# Patient Record
Sex: Male | Born: 1935 | Race: White | Hispanic: No | Marital: Married | Smoking: Former smoker
Health system: Southern US, Community
[De-identification: ages and names within clinical notes are randomized; demographics above are authoritative.]

## PROBLEM LIST (undated history)

## (undated) DIAGNOSIS — N401 Enlarged prostate with lower urinary tract symptoms: Secondary | ICD-10-CM

## (undated) DIAGNOSIS — R0600 Dyspnea, unspecified: Secondary | ICD-10-CM

## (undated) DIAGNOSIS — N138 Other obstructive and reflux uropathy: Secondary | ICD-10-CM

## (undated) DIAGNOSIS — G3183 Dementia with Lewy bodies: Secondary | ICD-10-CM

## (undated) DIAGNOSIS — F329 Major depressive disorder, single episode, unspecified: Secondary | ICD-10-CM

## (undated) DIAGNOSIS — G4733 Obstructive sleep apnea (adult) (pediatric): Secondary | ICD-10-CM

## (undated) DIAGNOSIS — A4902 Methicillin resistant Staphylococcus aureus infection, unspecified site: Secondary | ICD-10-CM

## (undated) DIAGNOSIS — H548 Legal blindness, as defined in USA: Secondary | ICD-10-CM

## (undated) DIAGNOSIS — C9310 Chronic myelomonocytic leukemia not having achieved remission: Secondary | ICD-10-CM

## (undated) DIAGNOSIS — D126 Benign neoplasm of colon, unspecified: Secondary | ICD-10-CM

## (undated) DIAGNOSIS — IMO0002 Reserved for concepts with insufficient information to code with codable children: Secondary | ICD-10-CM

## (undated) DIAGNOSIS — K219 Gastro-esophageal reflux disease without esophagitis: Secondary | ICD-10-CM

## (undated) DIAGNOSIS — I359 Nonrheumatic aortic valve disorder, unspecified: Secondary | ICD-10-CM

## (undated) DIAGNOSIS — A0472 Enterocolitis due to Clostridium difficile, not specified as recurrent: Secondary | ICD-10-CM

## (undated) DIAGNOSIS — F0280 Dementia in other diseases classified elsewhere without behavioral disturbance: Secondary | ICD-10-CM

## (undated) DIAGNOSIS — I4891 Unspecified atrial fibrillation: Secondary | ICD-10-CM

## (undated) DIAGNOSIS — F32A Depression, unspecified: Secondary | ICD-10-CM

## (undated) DIAGNOSIS — Z952 Presence of prosthetic heart valve: Secondary | ICD-10-CM

## (undated) DIAGNOSIS — C61 Malignant neoplasm of prostate: Secondary | ICD-10-CM

## (undated) DIAGNOSIS — I2510 Atherosclerotic heart disease of native coronary artery without angina pectoris: Secondary | ICD-10-CM

## (undated) DIAGNOSIS — D696 Thrombocytopenia, unspecified: Secondary | ICD-10-CM

## (undated) DIAGNOSIS — Z96649 Presence of unspecified artificial hip joint: Secondary | ICD-10-CM

## (undated) DIAGNOSIS — I10 Essential (primary) hypertension: Secondary | ICD-10-CM

## (undated) DIAGNOSIS — Z8614 Personal history of Methicillin resistant Staphylococcus aureus infection: Secondary | ICD-10-CM

## (undated) DIAGNOSIS — M1990 Unspecified osteoarthritis, unspecified site: Secondary | ICD-10-CM

## (undated) DIAGNOSIS — E785 Hyperlipidemia, unspecified: Secondary | ICD-10-CM

## (undated) HISTORY — DX: Reserved for concepts with insufficient information to code with codable children: IMO0002

## (undated) HISTORY — PX: CATARACT EXTRACTION, BILATERAL: SHX1313

## (undated) HISTORY — DX: Thrombocytopenia, unspecified: D69.6

## (undated) HISTORY — DX: Methicillin resistant Staphylococcus aureus infection, unspecified site: A49.02

## (undated) HISTORY — DX: Presence of unspecified artificial hip joint: Z96.649

## (undated) HISTORY — DX: Nonrheumatic aortic valve disorder, unspecified: I35.9

## (undated) HISTORY — DX: Essential (primary) hypertension: I10

## (undated) HISTORY — DX: Gastro-esophageal reflux disease without esophagitis: K21.9

## (undated) HISTORY — PX: ULTRASOUND GUIDANCE FOR VASCULAR ACCESS: SHX6516

## (undated) HISTORY — DX: Other obstructive and reflux uropathy: N13.8

## (undated) HISTORY — DX: Chronic myelomonocytic leukemia not having achieved remission: C93.10

## (undated) HISTORY — DX: Legal blindness, as defined in USA: H54.8

## (undated) HISTORY — DX: Obstructive sleep apnea (adult) (pediatric): G47.33

## (undated) HISTORY — DX: Depression, unspecified: F32.A

## (undated) HISTORY — DX: Unspecified osteoarthritis, unspecified site: M19.90

## (undated) HISTORY — DX: Enterocolitis due to Clostridium difficile, not specified as recurrent: A04.72

## (undated) HISTORY — DX: Hyperlipidemia, unspecified: E78.5

## (undated) HISTORY — DX: Malignant neoplasm of prostate: C61

## (undated) HISTORY — DX: Benign prostatic hyperplasia with lower urinary tract symptoms: N40.1

## (undated) HISTORY — DX: Major depressive disorder, single episode, unspecified: F32.9

## (undated) HISTORY — DX: Atherosclerotic heart disease of native coronary artery without angina pectoris: I25.10

## (undated) HISTORY — DX: Presence of prosthetic heart valve: Z95.2

## (undated) HISTORY — DX: Benign neoplasm of colon, unspecified: D12.6

## (undated) HISTORY — PX: CARDIAC CATHETERIZATION: SHX172

## (undated) HISTORY — DX: Dementia with Lewy bodies: G31.83

## (undated) HISTORY — DX: Unspecified atrial fibrillation: I48.91

## (undated) HISTORY — DX: Dyspnea, unspecified: R06.00

## (undated) HISTORY — DX: Dementia in other diseases classified elsewhere without behavioral disturbance: F02.80

---

## 1985-01-23 DIAGNOSIS — H548 Legal blindness, as defined in USA: Secondary | ICD-10-CM

## 1985-01-23 HISTORY — DX: Legal blindness, as defined in USA: H54.8

## 1991-01-24 HISTORY — PX: PROSTATECTOMY: SHX69

## 1993-03-23 DIAGNOSIS — D126 Benign neoplasm of colon, unspecified: Secondary | ICD-10-CM

## 1993-03-23 HISTORY — DX: Benign neoplasm of colon, unspecified: D12.6

## 1993-04-20 ENCOUNTER — Encounter: Payer: Self-pay | Admitting: Gastroenterology

## 1994-01-23 HISTORY — PX: ANAL SPHINCTEROTOMY: SHX1140

## 1997-11-10 ENCOUNTER — Emergency Department (HOSPITAL_COMMUNITY): Admission: EM | Admit: 1997-11-10 | Discharge: 1997-11-10 | Payer: Self-pay | Admitting: Emergency Medicine

## 1997-11-10 ENCOUNTER — Encounter: Payer: Self-pay | Admitting: Emergency Medicine

## 1997-11-11 ENCOUNTER — Ambulatory Visit: Admission: RE | Admit: 1997-11-11 | Discharge: 1997-11-11 | Payer: Self-pay | Admitting: Emergency Medicine

## 1997-12-25 ENCOUNTER — Ambulatory Visit (HOSPITAL_COMMUNITY): Admission: RE | Admit: 1997-12-25 | Discharge: 1997-12-25 | Payer: Self-pay | Admitting: *Deleted

## 1999-02-05 ENCOUNTER — Encounter: Payer: Self-pay | Admitting: Emergency Medicine

## 1999-02-05 ENCOUNTER — Emergency Department (HOSPITAL_COMMUNITY): Admission: EM | Admit: 1999-02-05 | Discharge: 1999-02-05 | Payer: Self-pay | Admitting: Emergency Medicine

## 1999-03-16 ENCOUNTER — Encounter (INDEPENDENT_AMBULATORY_CARE_PROVIDER_SITE_OTHER): Payer: Self-pay | Admitting: Specialist

## 1999-03-16 ENCOUNTER — Ambulatory Visit (HOSPITAL_BASED_OUTPATIENT_CLINIC_OR_DEPARTMENT_OTHER): Admission: RE | Admit: 1999-03-16 | Discharge: 1999-03-17 | Payer: Self-pay | Admitting: General Surgery

## 1999-06-30 ENCOUNTER — Encounter: Payer: Self-pay | Admitting: Urology

## 1999-06-30 ENCOUNTER — Ambulatory Visit (HOSPITAL_COMMUNITY): Admission: RE | Admit: 1999-06-30 | Discharge: 1999-06-30 | Payer: Self-pay | Admitting: Urology

## 2001-07-22 ENCOUNTER — Emergency Department (HOSPITAL_COMMUNITY): Admission: EM | Admit: 2001-07-22 | Discharge: 2001-07-23 | Payer: Self-pay

## 2001-11-03 ENCOUNTER — Encounter: Payer: Self-pay | Admitting: Emergency Medicine

## 2001-11-03 ENCOUNTER — Emergency Department (HOSPITAL_COMMUNITY): Admission: EM | Admit: 2001-11-03 | Discharge: 2001-11-03 | Payer: Self-pay | Admitting: Emergency Medicine

## 2003-03-04 ENCOUNTER — Encounter: Payer: Self-pay | Admitting: Gastroenterology

## 2003-09-10 ENCOUNTER — Ambulatory Visit (HOSPITAL_COMMUNITY): Admission: RE | Admit: 2003-09-10 | Discharge: 2003-09-10 | Payer: Self-pay | Admitting: Urology

## 2003-09-10 ENCOUNTER — Ambulatory Visit (HOSPITAL_BASED_OUTPATIENT_CLINIC_OR_DEPARTMENT_OTHER): Admission: RE | Admit: 2003-09-10 | Discharge: 2003-09-10 | Payer: Self-pay | Admitting: Urology

## 2003-09-26 ENCOUNTER — Emergency Department (HOSPITAL_COMMUNITY): Admission: EM | Admit: 2003-09-26 | Discharge: 2003-09-26 | Payer: Self-pay | Admitting: Emergency Medicine

## 2004-01-24 DIAGNOSIS — Z96649 Presence of unspecified artificial hip joint: Secondary | ICD-10-CM

## 2004-01-24 HISTORY — DX: Presence of unspecified artificial hip joint: Z96.649

## 2004-01-24 HISTORY — PX: TOTAL HIP ARTHROPLASTY: SHX124

## 2004-07-19 ENCOUNTER — Inpatient Hospital Stay (HOSPITAL_COMMUNITY): Admission: RE | Admit: 2004-07-19 | Discharge: 2004-07-26 | Payer: Self-pay | Admitting: Orthopedic Surgery

## 2004-07-19 ENCOUNTER — Ambulatory Visit: Payer: Self-pay | Admitting: Physical Medicine & Rehabilitation

## 2004-11-04 ENCOUNTER — Emergency Department (HOSPITAL_COMMUNITY): Admission: EM | Admit: 2004-11-04 | Discharge: 2004-11-04 | Payer: Self-pay | Admitting: Emergency Medicine

## 2005-04-14 ENCOUNTER — Emergency Department: Payer: Self-pay | Admitting: Emergency Medicine

## 2005-04-14 ENCOUNTER — Emergency Department (HOSPITAL_COMMUNITY): Admission: EM | Admit: 2005-04-14 | Discharge: 2005-04-15 | Payer: Self-pay | Admitting: Emergency Medicine

## 2006-02-23 ENCOUNTER — Ambulatory Visit: Payer: Self-pay | Admitting: Hematology & Oncology

## 2006-03-12 LAB — CBC WITH DIFFERENTIAL/PLATELET
EOS%: 0.4 % (ref 0.0–7.0)
Eosinophils Absolute: 0 10*3/uL (ref 0.0–0.5)
LYMPH%: 22.9 % (ref 14.0–48.0)
MCH: 33.3 pg (ref 28.0–33.4)
MCV: 92.7 fL (ref 81.6–98.0)
MONO%: 14.1 % — ABNORMAL HIGH (ref 0.0–13.0)
NEUT#: 3.8 10*3/uL (ref 1.5–6.5)
Platelets: 140 10*3/uL — ABNORMAL LOW (ref 145–400)
RBC: 4.21 10*6/uL (ref 4.20–5.71)

## 2006-03-12 LAB — CHCC SMEAR

## 2008-03-05 ENCOUNTER — Ambulatory Visit: Payer: Self-pay | Admitting: Gastroenterology

## 2008-03-24 ENCOUNTER — Ambulatory Visit: Payer: Self-pay | Admitting: Gastroenterology

## 2008-04-29 ENCOUNTER — Ambulatory Visit: Payer: Self-pay | Admitting: Gastroenterology

## 2008-04-29 DIAGNOSIS — K219 Gastro-esophageal reflux disease without esophagitis: Secondary | ICD-10-CM

## 2008-04-29 DIAGNOSIS — Z860100 Personal history of colon polyps, unspecified: Secondary | ICD-10-CM | POA: Insufficient documentation

## 2008-04-29 DIAGNOSIS — G4733 Obstructive sleep apnea (adult) (pediatric): Secondary | ICD-10-CM | POA: Insufficient documentation

## 2008-05-07 ENCOUNTER — Ambulatory Visit: Payer: Self-pay | Admitting: Gastroenterology

## 2009-01-23 HISTORY — PX: CHOLECYSTECTOMY: SHX55

## 2009-01-23 LAB — HM DIABETES EYE EXAM: HM Diabetic Eye Exam: NORMAL

## 2009-03-15 HISTORY — PX: HERNIA REPAIR: SHX51

## 2009-06-29 ENCOUNTER — Encounter: Admission: RE | Admit: 2009-06-29 | Discharge: 2009-06-29 | Payer: Self-pay | Admitting: Endocrinology

## 2009-08-18 ENCOUNTER — Observation Stay (HOSPITAL_COMMUNITY): Admission: RE | Admit: 2009-08-18 | Discharge: 2009-08-19 | Payer: Self-pay | Admitting: Surgery

## 2009-08-18 ENCOUNTER — Encounter (INDEPENDENT_AMBULATORY_CARE_PROVIDER_SITE_OTHER): Payer: Self-pay | Admitting: Surgery

## 2009-10-04 ENCOUNTER — Encounter: Admission: RE | Admit: 2009-10-04 | Discharge: 2009-10-04 | Payer: Self-pay | Admitting: Orthopedic Surgery

## 2009-11-08 ENCOUNTER — Inpatient Hospital Stay (HOSPITAL_COMMUNITY): Admission: EM | Admit: 2009-11-08 | Discharge: 2009-11-12 | Payer: Self-pay | Admitting: Emergency Medicine

## 2009-11-11 ENCOUNTER — Ambulatory Visit: Payer: Self-pay | Admitting: Infectious Disease

## 2009-11-18 ENCOUNTER — Encounter: Payer: Self-pay | Admitting: Internal Medicine

## 2009-11-22 ENCOUNTER — Encounter: Payer: Self-pay | Admitting: Infectious Disease

## 2009-11-24 ENCOUNTER — Encounter: Payer: Self-pay | Admitting: Internal Medicine

## 2009-11-30 ENCOUNTER — Ambulatory Visit: Payer: Self-pay | Admitting: Internal Medicine

## 2009-11-30 ENCOUNTER — Ambulatory Visit: Payer: Self-pay | Admitting: Oncology

## 2009-11-30 DIAGNOSIS — B951 Streptococcus, group B, as the cause of diseases classified elsewhere: Secondary | ICD-10-CM

## 2009-11-30 DIAGNOSIS — Z96649 Presence of unspecified artificial hip joint: Secondary | ICD-10-CM

## 2009-11-30 DIAGNOSIS — IMO0002 Reserved for concepts with insufficient information to code with codable children: Secondary | ICD-10-CM | POA: Insufficient documentation

## 2009-11-30 DIAGNOSIS — M1990 Unspecified osteoarthritis, unspecified site: Secondary | ICD-10-CM | POA: Insufficient documentation

## 2009-11-30 DIAGNOSIS — D693 Immune thrombocytopenic purpura: Secondary | ICD-10-CM | POA: Insufficient documentation

## 2009-11-30 DIAGNOSIS — I359 Nonrheumatic aortic valve disorder, unspecified: Secondary | ICD-10-CM | POA: Insufficient documentation

## 2009-12-01 ENCOUNTER — Telehealth: Payer: Self-pay | Admitting: Infectious Disease

## 2009-12-01 ENCOUNTER — Encounter: Payer: Self-pay | Admitting: Infectious Disease

## 2009-12-06 LAB — COMPREHENSIVE METABOLIC PANEL
Albumin: 4.3 g/dL (ref 3.5–5.2)
BUN: 19 mg/dL (ref 6–23)
CO2: 30 mEq/L (ref 19–32)
Calcium: 9.7 mg/dL (ref 8.4–10.5)
Chloride: 106 mEq/L (ref 96–112)
Creatinine, Ser: 1.11 mg/dL (ref 0.40–1.50)
Potassium: 4.4 mEq/L (ref 3.5–5.3)

## 2009-12-06 LAB — CBC WITH DIFFERENTIAL/PLATELET
BASO%: 0.2 % (ref 0.0–2.0)
EOS%: 0.9 % (ref 0.0–7.0)
MCH: 32.5 pg (ref 27.2–33.4)
MCHC: 35.1 g/dL (ref 32.0–36.0)
MCV: 92.7 fL (ref 79.3–98.0)
MONO%: 34.1 % — ABNORMAL HIGH (ref 0.0–14.0)
NEUT#: 4.1 10*3/uL (ref 1.5–6.5)
RBC: 4.17 10*6/uL — ABNORMAL LOW (ref 4.20–5.82)
RDW: 13.9 % (ref 11.0–14.6)

## 2009-12-06 LAB — LACTATE DEHYDROGENASE: LDH: 130 U/L (ref 94–250)

## 2009-12-06 LAB — MORPHOLOGY

## 2009-12-08 LAB — PSA: PSA: 0.19 ng/mL (ref <=4.00)

## 2009-12-08 LAB — PROTEIN ELECTROPHORESIS, SERUM
Beta Globulin: 5 % (ref 4.7–7.2)
Gamma Globulin: 8.2 % — ABNORMAL LOW (ref 11.1–18.8)
Total Protein, Serum Electrophoresis: 6.6 g/dL (ref 6.0–8.3)

## 2009-12-08 LAB — KAPPA/LAMBDA LIGHT CHAINS
Kappa free light chain: 0.85 mg/dL (ref 0.33–1.94)
Lambda Free Lght Chn: 1.33 mg/dL (ref 0.57–2.63)

## 2009-12-08 LAB — VITAMIN B12: Vitamin B-12: 1241 pg/mL — ABNORMAL HIGH (ref 211–911)

## 2009-12-09 ENCOUNTER — Telehealth: Payer: Self-pay | Admitting: Internal Medicine

## 2009-12-09 ENCOUNTER — Inpatient Hospital Stay (HOSPITAL_COMMUNITY): Admission: EM | Admit: 2009-12-09 | Discharge: 2009-12-13 | Payer: Self-pay | Admitting: Emergency Medicine

## 2009-12-21 ENCOUNTER — Encounter: Payer: Self-pay | Admitting: Internal Medicine

## 2009-12-21 ENCOUNTER — Other Ambulatory Visit
Admission: RE | Admit: 2009-12-21 | Discharge: 2009-12-21 | Payer: Self-pay | Source: Home / Self Care | Admitting: Oncology

## 2009-12-21 ENCOUNTER — Encounter: Payer: Self-pay | Admitting: Gastroenterology

## 2009-12-30 ENCOUNTER — Ambulatory Visit: Payer: Self-pay | Admitting: Internal Medicine

## 2009-12-30 DIAGNOSIS — A0472 Enterocolitis due to Clostridium difficile, not specified as recurrent: Secondary | ICD-10-CM

## 2009-12-30 HISTORY — DX: Enterocolitis due to Clostridium difficile, not specified as recurrent: A04.72

## 2010-01-04 ENCOUNTER — Ambulatory Visit (HOSPITAL_COMMUNITY)
Admission: RE | Admit: 2010-01-04 | Discharge: 2010-01-05 | Payer: Self-pay | Source: Home / Self Care | Attending: Cardiovascular Disease | Admitting: Cardiovascular Disease

## 2010-01-05 ENCOUNTER — Ambulatory Visit: Payer: Self-pay | Admitting: Oncology

## 2010-01-06 ENCOUNTER — Ambulatory Visit: Payer: Self-pay | Admitting: Cardiothoracic Surgery

## 2010-01-10 ENCOUNTER — Telehealth: Payer: Self-pay | Admitting: Internal Medicine

## 2010-01-11 ENCOUNTER — Emergency Department (HOSPITAL_COMMUNITY)
Admission: EM | Admit: 2010-01-11 | Discharge: 2010-01-11 | Payer: Self-pay | Source: Home / Self Care | Admitting: Emergency Medicine

## 2010-01-12 ENCOUNTER — Telehealth: Payer: Self-pay | Admitting: Internal Medicine

## 2010-01-15 ENCOUNTER — Encounter: Payer: Self-pay | Admitting: Infectious Disease

## 2010-01-18 ENCOUNTER — Encounter: Payer: Self-pay | Admitting: Internal Medicine

## 2010-01-26 ENCOUNTER — Other Ambulatory Visit: Payer: Self-pay | Admitting: Cardiothoracic Surgery

## 2010-01-26 ENCOUNTER — Ambulatory Visit (HOSPITAL_COMMUNITY)
Admission: RE | Admit: 2010-01-26 | Discharge: 2010-01-26 | Payer: Self-pay | Source: Home / Self Care | Attending: Cardiothoracic Surgery | Admitting: Cardiothoracic Surgery

## 2010-01-26 LAB — URINALYSIS, ROUTINE W REFLEX MICROSCOPIC
Bilirubin Urine: NEGATIVE
Ketones, ur: NEGATIVE mg/dL
Leukocytes, UA: NEGATIVE
Nitrite: NEGATIVE
Protein, ur: NEGATIVE mg/dL
Specific Gravity, Urine: 1.008 (ref 1.005–1.030)
Urine Glucose, Fasting: NEGATIVE mg/dL
Urobilinogen, UA: 0.2 mg/dL (ref 0.0–1.0)
pH: 5.5 (ref 5.0–8.0)

## 2010-01-26 LAB — CBC
HCT: 41.2 % (ref 39.0–52.0)
Hemoglobin: 14.3 g/dL (ref 13.0–17.0)
MCH: 31.1 pg (ref 26.0–34.0)
MCHC: 34.7 g/dL (ref 30.0–36.0)
MCV: 89.6 fL (ref 78.0–100.0)
Platelets: 138 10*3/uL — ABNORMAL LOW (ref 150–400)
RBC: 4.6 MIL/uL (ref 4.22–5.81)
RDW: 13.9 % (ref 11.5–15.5)
WBC: 12.3 10*3/uL — ABNORMAL HIGH (ref 4.0–10.5)

## 2010-01-26 LAB — BLOOD GAS, ARTERIAL
Acid-Base Excess: 1.8 mmol/L (ref 0.0–2.0)
Bicarbonate: 25.7 mEq/L — ABNORMAL HIGH (ref 20.0–24.0)
Drawn by: 206361
FIO2: 0.21 %
O2 Saturation: 97.6 %
Patient temperature: 98.6
TCO2: 26.9 mmol/L (ref 0–100)
pCO2 arterial: 39.3 mmHg (ref 35.0–45.0)
pH, Arterial: 7.431 (ref 7.350–7.450)
pO2, Arterial: 90.4 mmHg (ref 80.0–100.0)

## 2010-01-26 LAB — COMPREHENSIVE METABOLIC PANEL
ALT: 34 U/L (ref 0–53)
AST: 28 U/L (ref 0–37)
Albumin: 4.2 g/dL (ref 3.5–5.2)
Alkaline Phosphatase: 75 U/L (ref 39–117)
BUN: 9 mg/dL (ref 6–23)
CO2: 23 mEq/L (ref 19–32)
Calcium: 9.8 mg/dL (ref 8.4–10.5)
Chloride: 104 mEq/L (ref 96–112)
Creatinine, Ser: 0.86 mg/dL (ref 0.4–1.5)
GFR calc Af Amer: >60 mL/min (ref >60)
GFR calc non Af Amer: >60 mL/min (ref >60)
Glucose, Bld: 86 mg/dL (ref 70–99)
Potassium: 4.2 mEq/L (ref 3.5–5.1)
Sodium: 140 mEq/L (ref 135–145)
Total Bilirubin: 0.7 mg/dL (ref 0.3–1.2)
Total Protein: 6.6 g/dL (ref 6.0–8.3)

## 2010-01-26 LAB — PROTIME-INR
INR: 0.99 (ref 0.00–1.49)
Prothrombin Time: 13.3 seconds (ref 11.6–15.2)

## 2010-01-26 LAB — HEMOGLOBIN A1C
Hgb A1c MFr Bld: 5.6 % (ref <5.7)
Mean Plasma Glucose: 114 mg/dL (ref <117)

## 2010-01-26 LAB — ABO/RH: ABO/RH(D): A POS

## 2010-01-26 LAB — APTT: aPTT: 42 seconds — ABNORMAL HIGH (ref 24–37)

## 2010-01-26 LAB — URINE MICROSCOPIC-ADD ON

## 2010-01-28 ENCOUNTER — Encounter: Payer: Self-pay | Admitting: Cardiothoracic Surgery

## 2010-01-28 ENCOUNTER — Inpatient Hospital Stay (HOSPITAL_COMMUNITY)
Admission: RE | Admit: 2010-01-28 | Discharge: 2010-02-11 | Disposition: A | Discharge status: Rehabilitation Facility | Payer: Self-pay | Source: Home / Self Care | Attending: Cardiothoracic Surgery | Admitting: Cardiothoracic Surgery

## 2010-01-28 HISTORY — PX: AORTIC VALVE REPLACEMENT: SHX41

## 2010-01-28 HISTORY — PX: CORONARY ARTERY BYPASS GRAFT: SHX141

## 2010-01-28 LAB — POCT I-STAT 4, (NA,K, GLUC, HGB,HCT)
Glucose, Bld: 121 mg/dL — ABNORMAL HIGH (ref 70–99)
Glucose, Bld: 108 mg/dL — ABNORMAL HIGH (ref 70–99)
Glucose, Bld: 127 mg/dL — ABNORMAL HIGH (ref 70–99)
Glucose, Bld: 166 mg/dL — ABNORMAL HIGH (ref 70–99)
Glucose, Bld: 99 mg/dL (ref 70–99)
HCT: 32 % — ABNORMAL LOW (ref 39.0–52.0)
HCT: 25 % — ABNORMAL LOW (ref 39.0–52.0)
HCT: 25 % — ABNORMAL LOW (ref 39.0–52.0)
HCT: 24 % — ABNORMAL LOW (ref 39.0–52.0)
HCT: 30 % — ABNORMAL LOW (ref 39.0–52.0)
Hemoglobin: 10.9 g/dL — ABNORMAL LOW (ref 13.0–17.0)
Hemoglobin: 8.5 g/dL — ABNORMAL LOW (ref 13.0–17.0)
Hemoglobin: 8.5 g/dL — ABNORMAL LOW (ref 13.0–17.0)
Hemoglobin: 8.2 g/dL — ABNORMAL LOW (ref 13.0–17.0)
Hemoglobin: 10.2 g/dL — ABNORMAL LOW (ref 13.0–17.0)
Potassium: 4 mEq/L (ref 3.5–5.1)
Potassium: 3.9 mEq/L (ref 3.5–5.1)
Potassium: 4.5 mEq/L (ref 3.5–5.1)
Potassium: 4 mEq/L (ref 3.5–5.1)
Potassium: 3.5 mEq/L (ref 3.5–5.1)
Sodium: 140 mEq/L (ref 135–145)
Sodium: 135 mEq/L (ref 135–145)
Sodium: 137 mEq/L (ref 135–145)
Sodium: 138 mEq/L (ref 135–145)
Sodium: 141 mEq/L (ref 135–145)

## 2010-01-28 LAB — CBC
HCT: 29.5 % — ABNORMAL LOW (ref 39.0–52.0)
Hemoglobin: 10.1 g/dL — ABNORMAL LOW (ref 13.0–17.0)
MCH: 30.5 pg (ref 26.0–34.0)
MCHC: 34.2 g/dL (ref 30.0–36.0)
MCV: 89.1 fL (ref 78.0–100.0)
Platelets: 103 10*3/uL — ABNORMAL LOW (ref 150–400)
RBC: 3.31 MIL/uL — ABNORMAL LOW (ref 4.22–5.81)
RDW: 13.9 % (ref 11.5–15.5)
WBC: 23.3 10*3/uL — ABNORMAL HIGH (ref 4.0–10.5)

## 2010-01-28 LAB — POCT I-STAT 3, ART BLOOD GAS (G3+)
Acid-base deficit: 2 mmol/L (ref 0.0–2.0)
Acid-base deficit: 3 mmol/L — ABNORMAL HIGH (ref 0.0–2.0)
Bicarbonate: 26.2 mEq/L — ABNORMAL HIGH (ref 20.0–24.0)
Bicarbonate: 23.8 mEq/L (ref 20.0–24.0)
Bicarbonate: 21.4 mEq/L (ref 20.0–24.0)
O2 Saturation: 100 %
O2 Saturation: 99 %
O2 Saturation: 93 %
Patient temperature: 36
TCO2: 28 mmol/L (ref 0–100)
TCO2: 25 mmol/L (ref 0–100)
TCO2: 22 mmol/L (ref 0–100)
pCO2 arterial: 47.4 mmHg — ABNORMAL HIGH (ref 35.0–45.0)
pCO2 arterial: 45.8 mmHg — ABNORMAL HIGH (ref 35.0–45.0)
pCO2 arterial: 33.8 mmHg — ABNORMAL LOW (ref 35.0–45.0)
pH, Arterial: 7.35 (ref 7.350–7.450)
pH, Arterial: 7.325 — ABNORMAL LOW (ref 7.350–7.450)
pH, Arterial: 7.405 (ref 7.350–7.450)
pO2, Arterial: 352 mmHg — ABNORMAL HIGH (ref 80.0–100.0)
pO2, Arterial: 134 mmHg — ABNORMAL HIGH (ref 80.0–100.0)
pO2, Arterial: 62 mmHg — ABNORMAL LOW (ref 80.0–100.0)

## 2010-01-28 LAB — POCT I-STAT 3, VENOUS BLOOD GAS (G3P V)
Acid-base deficit: 2 mmol/L (ref 0.0–2.0)
Bicarbonate: 24.6 mEq/L — ABNORMAL HIGH (ref 20.0–24.0)
O2 Saturation: 82 %
TCO2: 26 mmol/L (ref 0–100)
pCO2, Ven: 48.5 mmHg (ref 45.0–50.0)
pH, Ven: 7.313 — ABNORMAL HIGH (ref 7.250–7.300)
pO2, Ven: 51 mmHg — ABNORMAL HIGH (ref 30.0–45.0)

## 2010-01-28 LAB — GLUCOSE, POCT (MANUAL RESULT ENTRY)
Glucose, Bld: 113 mg/dL — ABNORMAL HIGH (ref 70–99)
Glucose, Bld: 136 mg/dL — ABNORMAL HIGH (ref 70–99)
Operator id: 139621
Operator id: 3342

## 2010-01-28 LAB — GLUCOSE, CAPILLARY: Glucose-Capillary: 115 mg/dL — ABNORMAL HIGH (ref 70–99)

## 2010-01-28 LAB — APTT: aPTT: 38 seconds — ABNORMAL HIGH (ref 24–37)

## 2010-01-28 LAB — HEMOGLOBIN AND HEMATOCRIT, BLOOD
HCT: 23.9 % — ABNORMAL LOW (ref 39.0–52.0)
Hemoglobin: 8.3 g/dL — ABNORMAL LOW (ref 13.0–17.0)

## 2010-01-28 LAB — PROTIME-INR
INR: 1.52 — ABNORMAL HIGH (ref 0.00–1.49)
Prothrombin Time: 18.5 seconds — ABNORMAL HIGH (ref 11.6–15.2)

## 2010-01-28 LAB — PLATELET COUNT: Platelets: 90 10*3/uL — ABNORMAL LOW (ref 150–400)

## 2010-02-07 LAB — CBC
HCT: 30.5 % — ABNORMAL LOW (ref 39.0–52.0)
HCT: 27.2 % — ABNORMAL LOW (ref 39.0–52.0)
HCT: 26.6 % — ABNORMAL LOW (ref 39.0–52.0)
HCT: 24.8 % — ABNORMAL LOW (ref 39.0–52.0)
HCT: 23 % — ABNORMAL LOW (ref 39.0–52.0)
HCT: 22.5 % — ABNORMAL LOW (ref 39.0–52.0)
HCT: 24.5 % — ABNORMAL LOW (ref 39.0–52.0)
HCT: 25.9 % — ABNORMAL LOW (ref 39.0–52.0)
HCT: 25.2 % — ABNORMAL LOW (ref 39.0–52.0)
HCT: 26.5 % — ABNORMAL LOW (ref 39.0–52.0)
HCT: 27.1 % — ABNORMAL LOW (ref 39.0–52.0)
HCT: 25.9 % — ABNORMAL LOW (ref 39.0–52.0)
Hemoglobin: 10.5 g/dL — ABNORMAL LOW (ref 13.0–17.0)
Hemoglobin: 9.3 g/dL — ABNORMAL LOW (ref 13.0–17.0)
Hemoglobin: 8.8 g/dL — ABNORMAL LOW (ref 13.0–17.0)
Hemoglobin: 8.3 g/dL — ABNORMAL LOW (ref 13.0–17.0)
Hemoglobin: 7.8 g/dL — ABNORMAL LOW (ref 13.0–17.0)
Hemoglobin: 7.8 g/dL — ABNORMAL LOW (ref 13.0–17.0)
Hemoglobin: 8.6 g/dL — ABNORMAL LOW (ref 13.0–17.0)
Hemoglobin: 8.9 g/dL — ABNORMAL LOW (ref 13.0–17.0)
Hemoglobin: 8.8 g/dL — ABNORMAL LOW (ref 13.0–17.0)
Hemoglobin: 9.4 g/dL — ABNORMAL LOW (ref 13.0–17.0)
Hemoglobin: 9.5 g/dL — ABNORMAL LOW (ref 13.0–17.0)
Hemoglobin: 8.9 g/dL — ABNORMAL LOW (ref 13.0–17.0)
MCH: 31.1 pg (ref 26.0–34.0)
MCH: 31.3 pg (ref 26.0–34.0)
MCH: 30.9 pg (ref 26.0–34.0)
MCH: 30.9 pg (ref 26.0–34.0)
MCH: 30.8 pg (ref 26.0–34.0)
MCH: 31.5 pg (ref 26.0–34.0)
MCH: 31.2 pg (ref 26.0–34.0)
MCH: 30.5 pg (ref 26.0–34.0)
MCH: 30.9 pg (ref 26.0–34.0)
MCH: 30.9 pg (ref 26.0–34.0)
MCH: 30.6 pg (ref 26.0–34.0)
MCH: 30.6 pg (ref 26.0–34.0)
MCHC: 34.4 g/dL (ref 30.0–36.0)
MCHC: 34.2 g/dL (ref 30.0–36.0)
MCHC: 33.1 g/dL (ref 30.0–36.0)
MCHC: 33.5 g/dL (ref 30.0–36.0)
MCHC: 33.9 g/dL (ref 30.0–36.0)
MCHC: 34.7 g/dL (ref 30.0–36.0)
MCHC: 35.1 g/dL (ref 30.0–36.0)
MCHC: 34.4 g/dL (ref 30.0–36.0)
MCHC: 34.9 g/dL (ref 30.0–36.0)
MCHC: 35.5 g/dL (ref 30.0–36.0)
MCHC: 35.1 g/dL (ref 30.0–36.0)
MCHC: 34.4 g/dL (ref 30.0–36.0)
MCV: 90.2 fL (ref 78.0–100.0)
MCV: 91.6 fL (ref 78.0–100.0)
MCV: 93.3 fL (ref 78.0–100.0)
MCV: 92.2 fL (ref 78.0–100.0)
MCV: 90.9 fL (ref 78.0–100.0)
MCV: 90.7 fL (ref 78.0–100.0)
MCV: 88.8 fL (ref 78.0–100.0)
MCV: 88.7 fL (ref 78.0–100.0)
MCV: 88.4 fL (ref 78.0–100.0)
MCV: 87.2 fL (ref 78.0–100.0)
MCV: 87.4 fL (ref 78.0–100.0)
MCV: 89 fL (ref 78.0–100.0)
Platelets: 137 10*3/uL — ABNORMAL LOW (ref 150–400)
Platelets: 89 10*3/uL — ABNORMAL LOW (ref 150–400)
Platelets: 89 10*3/uL — ABNORMAL LOW (ref 150–400)
Platelets: 63 10*3/uL — ABNORMAL LOW (ref 150–400)
Platelets: 61 10*3/uL — ABNORMAL LOW (ref 150–400)
Platelets: 57 10*3/uL — ABNORMAL LOW (ref 150–400)
Platelets: 67 10*3/uL — ABNORMAL LOW (ref 150–400)
Platelets: 79 10*3/uL — ABNORMAL LOW (ref 150–400)
Platelets: 82 10*3/uL — ABNORMAL LOW (ref 150–400)
Platelets: 109 10*3/uL — ABNORMAL LOW (ref 150–400)
Platelets: 142 10*3/uL — ABNORMAL LOW (ref 150–400)
Platelets: 164 10*3/uL (ref 150–400)
RBC: 3.38 MIL/uL — ABNORMAL LOW (ref 4.22–5.81)
RBC: 2.97 MIL/uL — ABNORMAL LOW (ref 4.22–5.81)
RBC: 2.85 MIL/uL — ABNORMAL LOW (ref 4.22–5.81)
RBC: 2.69 MIL/uL — ABNORMAL LOW (ref 4.22–5.81)
RBC: 2.53 MIL/uL — ABNORMAL LOW (ref 4.22–5.81)
RBC: 2.48 MIL/uL — ABNORMAL LOW (ref 4.22–5.81)
RBC: 2.76 MIL/uL — ABNORMAL LOW (ref 4.22–5.81)
RBC: 2.92 MIL/uL — ABNORMAL LOW (ref 4.22–5.81)
RBC: 2.85 MIL/uL — ABNORMAL LOW (ref 4.22–5.81)
RBC: 3.04 MIL/uL — ABNORMAL LOW (ref 4.22–5.81)
RBC: 3.1 MIL/uL — ABNORMAL LOW (ref 4.22–5.81)
RBC: 2.91 MIL/uL — ABNORMAL LOW (ref 4.22–5.81)
RDW: 14 % (ref 11.5–15.5)
RDW: 14.4 % (ref 11.5–15.5)
RDW: 14.9 % (ref 11.5–15.5)
RDW: 14.9 % (ref 11.5–15.5)
RDW: 14.9 % (ref 11.5–15.5)
RDW: 14.7 % (ref 11.5–15.5)
RDW: 14.9 % (ref 11.5–15.5)
RDW: 14.9 % (ref 11.5–15.5)
RDW: 14.9 % (ref 11.5–15.5)
RDW: 14.5 % (ref 11.5–15.5)
RDW: 14.8 % (ref 11.5–15.5)
RDW: 15.1 % (ref 11.5–15.5)
WBC: 49.5 10*3/uL — ABNORMAL HIGH (ref 4.0–10.5)
WBC: 56.1 10*3/uL (ref 4.0–10.5)
WBC: 66.2 10*3/uL (ref 4.0–10.5)
WBC: 52.1 10*3/uL (ref 4.0–10.5)
WBC: 47.9 10*3/uL — ABNORMAL HIGH (ref 4.0–10.5)
WBC: 39.6 10*3/uL — ABNORMAL HIGH (ref 4.0–10.5)
WBC: 23 10*3/uL — ABNORMAL HIGH (ref 4.0–10.5)
WBC: 21.8 10*3/uL — ABNORMAL HIGH (ref 4.0–10.5)
WBC: 18.1 10*3/uL — ABNORMAL HIGH (ref 4.0–10.5)
WBC: 19.1 10*3/uL — ABNORMAL HIGH (ref 4.0–10.5)
WBC: 22 10*3/uL — ABNORMAL HIGH (ref 4.0–10.5)
WBC: 13.7 10*3/uL — ABNORMAL HIGH (ref 4.0–10.5)

## 2010-02-07 LAB — COMPREHENSIVE METABOLIC PANEL
ALT: 252 U/L — ABNORMAL HIGH (ref 0–53)
ALT: 229 U/L — ABNORMAL HIGH (ref 0–53)
ALT: 199 U/L — ABNORMAL HIGH (ref 0–53)
AST: 320 U/L — ABNORMAL HIGH (ref 0–37)
AST: 153 U/L — ABNORMAL HIGH (ref 0–37)
AST: 102 U/L — ABNORMAL HIGH (ref 0–37)
Albumin: 3.4 g/dL — ABNORMAL LOW (ref 3.5–5.2)
Albumin: 2.8 g/dL — ABNORMAL LOW (ref 3.5–5.2)
Albumin: 2.9 g/dL — ABNORMAL LOW (ref 3.5–5.2)
Alkaline Phosphatase: 95 U/L (ref 39–117)
Alkaline Phosphatase: 111 U/L (ref 39–117)
Alkaline Phosphatase: 119 U/L — ABNORMAL HIGH (ref 39–117)
BUN: 54 mg/dL — ABNORMAL HIGH (ref 6–23)
BUN: 54 mg/dL — ABNORMAL HIGH (ref 6–23)
BUN: 39 mg/dL — ABNORMAL HIGH (ref 6–23)
CO2: 24 mEq/L (ref 19–32)
CO2: 26 mEq/L (ref 19–32)
CO2: 27 mEq/L (ref 19–32)
Calcium: 8.7 mg/dL (ref 8.4–10.5)
Calcium: 8.1 mg/dL — ABNORMAL LOW (ref 8.4–10.5)
Calcium: 8.3 mg/dL — ABNORMAL LOW (ref 8.4–10.5)
Chloride: 102 mEq/L (ref 96–112)
Chloride: 99 mEq/L (ref 96–112)
Chloride: 102 mEq/L (ref 96–112)
Creatinine, Ser: 1.72 mg/dL — ABNORMAL HIGH (ref 0.4–1.5)
Creatinine, Ser: 1.39 mg/dL (ref 0.4–1.5)
Creatinine, Ser: 1.18 mg/dL (ref 0.4–1.5)
GFR calc Af Amer: 47 mL/min — ABNORMAL LOW (ref >60)
GFR calc Af Amer: 59 mL/min — ABNORMAL LOW (ref >60)
GFR calc Af Amer: >60 mL/min (ref >60)
GFR calc non Af Amer: 38 mL/min — ABNORMAL LOW (ref >60)
GFR calc non Af Amer: 49 mL/min — ABNORMAL LOW (ref >60)
GFR calc non Af Amer: 59 mL/min — ABNORMAL LOW (ref >60)
Glucose, Bld: 108 mg/dL — ABNORMAL HIGH (ref 70–99)
Glucose, Bld: 101 mg/dL — ABNORMAL HIGH (ref 70–99)
Glucose, Bld: 108 mg/dL — ABNORMAL HIGH (ref 70–99)
Potassium: 4.1 mEq/L (ref 3.5–5.1)
Potassium: 3.4 mEq/L — ABNORMAL LOW (ref 3.5–5.1)
Potassium: 3.4 mEq/L — ABNORMAL LOW (ref 3.5–5.1)
Sodium: 138 mEq/L (ref 135–145)
Sodium: 136 mEq/L (ref 135–145)
Sodium: 139 mEq/L (ref 135–145)
Total Bilirubin: 1.6 mg/dL — ABNORMAL HIGH (ref 0.3–1.2)
Total Bilirubin: 1.2 mg/dL (ref 0.3–1.2)
Total Bilirubin: 1.2 mg/dL (ref 0.3–1.2)
Total Protein: 5.2 g/dL — ABNORMAL LOW (ref 6.0–8.3)
Total Protein: 4.9 g/dL — ABNORMAL LOW (ref 6.0–8.3)
Total Protein: 4.9 g/dL — ABNORMAL LOW (ref 6.0–8.3)

## 2010-02-07 LAB — GLUCOSE, CAPILLARY
Glucose-Capillary: 110 mg/dL — ABNORMAL HIGH (ref 70–99)
Glucose-Capillary: 124 mg/dL — ABNORMAL HIGH (ref 70–99)
Glucose-Capillary: 145 mg/dL — ABNORMAL HIGH (ref 70–99)
Glucose-Capillary: 78 mg/dL (ref 70–99)
Glucose-Capillary: 82 mg/dL (ref 70–99)
Glucose-Capillary: 135 mg/dL — ABNORMAL HIGH (ref 70–99)
Glucose-Capillary: 138 mg/dL — ABNORMAL HIGH (ref 70–99)
Glucose-Capillary: 152 mg/dL — ABNORMAL HIGH (ref 70–99)
Glucose-Capillary: 163 mg/dL — ABNORMAL HIGH (ref 70–99)
Glucose-Capillary: 163 mg/dL — ABNORMAL HIGH (ref 70–99)
Glucose-Capillary: 164 mg/dL — ABNORMAL HIGH (ref 70–99)
Glucose-Capillary: 168 mg/dL — ABNORMAL HIGH (ref 70–99)
Glucose-Capillary: 123 mg/dL — ABNORMAL HIGH (ref 70–99)
Glucose-Capillary: 124 mg/dL — ABNORMAL HIGH (ref 70–99)
Glucose-Capillary: 129 mg/dL — ABNORMAL HIGH (ref 70–99)
Glucose-Capillary: 145 mg/dL — ABNORMAL HIGH (ref 70–99)
Glucose-Capillary: 147 mg/dL — ABNORMAL HIGH (ref 70–99)
Glucose-Capillary: 102 mg/dL — ABNORMAL HIGH (ref 70–99)
Glucose-Capillary: 102 mg/dL — ABNORMAL HIGH (ref 70–99)
Glucose-Capillary: 104 mg/dL — ABNORMAL HIGH (ref 70–99)
Glucose-Capillary: 107 mg/dL — ABNORMAL HIGH (ref 70–99)
Glucose-Capillary: 113 mg/dL — ABNORMAL HIGH (ref 70–99)
Glucose-Capillary: 116 mg/dL — ABNORMAL HIGH (ref 70–99)
Glucose-Capillary: 118 mg/dL — ABNORMAL HIGH (ref 70–99)
Glucose-Capillary: 120 mg/dL — ABNORMAL HIGH (ref 70–99)
Glucose-Capillary: 121 mg/dL — ABNORMAL HIGH (ref 70–99)
Glucose-Capillary: 132 mg/dL — ABNORMAL HIGH (ref 70–99)
Glucose-Capillary: 133 mg/dL — ABNORMAL HIGH (ref 70–99)
Glucose-Capillary: 96 mg/dL (ref 70–99)
Glucose-Capillary: 96 mg/dL (ref 70–99)
Glucose-Capillary: 102 mg/dL — ABNORMAL HIGH (ref 70–99)
Glucose-Capillary: 109 mg/dL — ABNORMAL HIGH (ref 70–99)
Glucose-Capillary: 112 mg/dL — ABNORMAL HIGH (ref 70–99)
Glucose-Capillary: 113 mg/dL — ABNORMAL HIGH (ref 70–99)
Glucose-Capillary: 120 mg/dL — ABNORMAL HIGH (ref 70–99)
Glucose-Capillary: 124 mg/dL — ABNORMAL HIGH (ref 70–99)
Glucose-Capillary: 129 mg/dL — ABNORMAL HIGH (ref 70–99)
Glucose-Capillary: 136 mg/dL — ABNORMAL HIGH (ref 70–99)
Glucose-Capillary: 90 mg/dL (ref 70–99)
Glucose-Capillary: 94 mg/dL (ref 70–99)
Glucose-Capillary: 98 mg/dL (ref 70–99)
Glucose-Capillary: 102 mg/dL — ABNORMAL HIGH (ref 70–99)
Glucose-Capillary: 108 mg/dL — ABNORMAL HIGH (ref 70–99)
Glucose-Capillary: 113 mg/dL — ABNORMAL HIGH (ref 70–99)
Glucose-Capillary: 115 mg/dL — ABNORMAL HIGH (ref 70–99)
Glucose-Capillary: 116 mg/dL — ABNORMAL HIGH (ref 70–99)
Glucose-Capillary: 116 mg/dL — ABNORMAL HIGH (ref 70–99)
Glucose-Capillary: 125 mg/dL — ABNORMAL HIGH (ref 70–99)
Glucose-Capillary: 129 mg/dL — ABNORMAL HIGH (ref 70–99)

## 2010-02-07 LAB — POCT I-STAT 3, ART BLOOD GAS (G3+)
Acid-base deficit: 5 mmol/L — ABNORMAL HIGH (ref 0.0–2.0)
Acid-base deficit: 5 mmol/L — ABNORMAL HIGH (ref 0.0–2.0)
Acid-base deficit: 6 mmol/L — ABNORMAL HIGH (ref 0.0–2.0)
Bicarbonate: 18.7 mEq/L — ABNORMAL LOW (ref 20.0–24.0)
Bicarbonate: 19.3 mEq/L — ABNORMAL LOW (ref 20.0–24.0)
Bicarbonate: 19.6 mEq/L — ABNORMAL LOW (ref 20.0–24.0)
O2 Saturation: 91 %
O2 Saturation: 95 %
O2 Saturation: 96 %
Patient temperature: 38
Patient temperature: 39.2
Patient temperature: 97.7
TCO2: 20 mmol/L (ref 0–100)
TCO2: 20 mmol/L (ref 0–100)
TCO2: 21 mmol/L (ref 0–100)
pCO2 arterial: 34 mmHg — ABNORMAL LOW (ref 35.0–45.0)
pCO2 arterial: 34.4 mmHg — ABNORMAL LOW (ref 35.0–45.0)
pCO2 arterial: 35 mmHg (ref 35.0–45.0)
pH, Arterial: 7.346 — ABNORMAL LOW (ref 7.350–7.450)
pH, Arterial: 7.362 (ref 7.350–7.450)
pH, Arterial: 7.366 (ref 7.350–7.450)
pO2, Arterial: 62 mmHg — ABNORMAL LOW (ref 80.0–100.0)
pO2, Arterial: 79 mmHg — ABNORMAL LOW (ref 80.0–100.0)
pO2, Arterial: 97 mmHg (ref 80.0–100.0)

## 2010-02-07 LAB — BASIC METABOLIC PANEL
BUN: 14 mg/dL (ref 6–23)
BUN: 23 mg/dL (ref 6–23)
BUN: 30 mg/dL — ABNORMAL HIGH (ref 6–23)
BUN: 40 mg/dL — ABNORMAL HIGH (ref 6–23)
BUN: 47 mg/dL — ABNORMAL HIGH (ref 6–23)
BUN: 56 mg/dL — ABNORMAL HIGH (ref 6–23)
BUN: 56 mg/dL — ABNORMAL HIGH (ref 6–23)
BUN: 23 mg/dL (ref 6–23)
BUN: 11 mg/dL (ref 6–23)
CO2: 21 mEq/L (ref 19–32)
CO2: 17 mEq/L — ABNORMAL LOW (ref 19–32)
CO2: 24 mEq/L (ref 19–32)
CO2: 24 mEq/L (ref 19–32)
CO2: 24 mEq/L (ref 19–32)
CO2: 26 mEq/L (ref 19–32)
CO2: 26 mEq/L (ref 19–32)
CO2: 26 mEq/L (ref 19–32)
CO2: 26 mEq/L (ref 19–32)
Calcium: 7.8 mg/dL — ABNORMAL LOW (ref 8.4–10.5)
Calcium: 8.3 mg/dL — ABNORMAL LOW (ref 8.4–10.5)
Calcium: 8.5 mg/dL (ref 8.4–10.5)
Calcium: 8.3 mg/dL — ABNORMAL LOW (ref 8.4–10.5)
Calcium: 8.6 mg/dL (ref 8.4–10.5)
Calcium: 8.6 mg/dL (ref 8.4–10.5)
Calcium: 8.5 mg/dL (ref 8.4–10.5)
Calcium: 8.3 mg/dL — ABNORMAL LOW (ref 8.4–10.5)
Calcium: 8.4 mg/dL (ref 8.4–10.5)
Chloride: 109 mEq/L (ref 96–112)
Chloride: 108 mEq/L (ref 96–112)
Chloride: 107 mEq/L (ref 96–112)
Chloride: 104 mEq/L (ref 96–112)
Chloride: 105 mEq/L (ref 96–112)
Chloride: 103 mEq/L (ref 96–112)
Chloride: 102 mEq/L (ref 96–112)
Chloride: 104 mEq/L (ref 96–112)
Chloride: 105 mEq/L (ref 96–112)
Creatinine, Ser: 1.43 mg/dL (ref 0.4–1.5)
Creatinine, Ser: 2.04 mg/dL — ABNORMAL HIGH (ref 0.4–1.5)
Creatinine, Ser: 1.87 mg/dL — ABNORMAL HIGH (ref 0.4–1.5)
Creatinine, Ser: 1.91 mg/dL — ABNORMAL HIGH (ref 0.4–1.5)
Creatinine, Ser: 1.92 mg/dL — ABNORMAL HIGH (ref 0.4–1.5)
Creatinine, Ser: 1.55 mg/dL — ABNORMAL HIGH (ref 0.4–1.5)
Creatinine, Ser: 1.41 mg/dL (ref 0.4–1.5)
Creatinine, Ser: 0.97 mg/dL (ref 0.4–1.5)
Creatinine, Ser: 0.91 mg/dL (ref 0.4–1.5)
GFR calc Af Amer: 58 mL/min — ABNORMAL LOW (ref >60)
GFR calc Af Amer: 38 mL/min — ABNORMAL LOW (ref >60)
GFR calc Af Amer: 42 mL/min — ABNORMAL LOW (ref >60)
GFR calc Af Amer: 41 mL/min — ABNORMAL LOW (ref >60)
GFR calc Af Amer: 41 mL/min — ABNORMAL LOW (ref >60)
GFR calc Af Amer: 52 mL/min — ABNORMAL LOW (ref >60)
GFR calc Af Amer: 59 mL/min — ABNORMAL LOW (ref >60)
GFR calc Af Amer: >60 mL/min (ref >60)
GFR calc Af Amer: >60 mL/min (ref >60)
GFR calc non Af Amer: 48 mL/min — ABNORMAL LOW (ref >60)
GFR calc non Af Amer: 32 mL/min — ABNORMAL LOW (ref >60)
GFR calc non Af Amer: 35 mL/min — ABNORMAL LOW (ref >60)
GFR calc non Af Amer: 34 mL/min — ABNORMAL LOW (ref >60)
GFR calc non Af Amer: 34 mL/min — ABNORMAL LOW (ref >60)
GFR calc non Af Amer: 43 mL/min — ABNORMAL LOW (ref >60)
GFR calc non Af Amer: 48 mL/min — ABNORMAL LOW (ref >60)
GFR calc non Af Amer: >60 mL/min (ref >60)
GFR calc non Af Amer: >60 mL/min (ref >60)
Glucose, Bld: 140 mg/dL — ABNORMAL HIGH (ref 70–99)
Glucose, Bld: 191 mg/dL — ABNORMAL HIGH (ref 70–99)
Glucose, Bld: 150 mg/dL — ABNORMAL HIGH (ref 70–99)
Glucose, Bld: 120 mg/dL — ABNORMAL HIGH (ref 70–99)
Glucose, Bld: 103 mg/dL — ABNORMAL HIGH (ref 70–99)
Glucose, Bld: 112 mg/dL — ABNORMAL HIGH (ref 70–99)
Glucose, Bld: 105 mg/dL — ABNORMAL HIGH (ref 70–99)
Glucose, Bld: 113 mg/dL — ABNORMAL HIGH (ref 70–99)
Glucose, Bld: 106 mg/dL — ABNORMAL HIGH (ref 70–99)
Potassium: 4.3 mEq/L (ref 3.5–5.1)
Potassium: 4.4 mEq/L (ref 3.5–5.1)
Potassium: 4.3 mEq/L (ref 3.5–5.1)
Potassium: 3.9 mEq/L (ref 3.5–5.1)
Potassium: 3.9 mEq/L (ref 3.5–5.1)
Potassium: 3.9 mEq/L (ref 3.5–5.1)
Potassium: 3.9 mEq/L (ref 3.5–5.1)
Potassium: 4 mEq/L (ref 3.5–5.1)
Potassium: 4 mEq/L (ref 3.5–5.1)
Sodium: 138 mEq/L (ref 135–145)
Sodium: 141 mEq/L (ref 135–145)
Sodium: 141 mEq/L (ref 135–145)
Sodium: 137 mEq/L (ref 135–145)
Sodium: 140 mEq/L (ref 135–145)
Sodium: 138 mEq/L (ref 135–145)
Sodium: 138 mEq/L (ref 135–145)
Sodium: 140 mEq/L (ref 135–145)
Sodium: 138 mEq/L (ref 135–145)

## 2010-02-07 LAB — MAGNESIUM
Magnesium: 3 mg/dL — ABNORMAL HIGH (ref 1.5–2.5)
Magnesium: 2.7 mg/dL — ABNORMAL HIGH (ref 1.5–2.5)
Magnesium: 2.7 mg/dL — ABNORMAL HIGH (ref 1.5–2.5)

## 2010-02-07 LAB — DIFFERENTIAL
Band Neutrophils: 0 % (ref 0–10)
Band Neutrophils: 11 % — ABNORMAL HIGH (ref 0–10)
Basophils Absolute: 0 10*3/uL (ref 0.0–0.1)
Basophils Relative: 0 % (ref 0–1)
Basophils Relative: 0 % (ref 0–1)
Blasts: 0 %
Blasts: 0 %
Eosinophils Absolute: 0 10*3/uL (ref 0.0–0.7)
Eosinophils Relative: 0 % (ref 0–5)
Eosinophils Relative: 0 % (ref 0–5)
Lymphocytes Relative: 5 % — ABNORMAL LOW (ref 12–46)
Lymphocytes Relative: 2 % — ABNORMAL LOW (ref 12–46)
Lymphs Abs: 3.3 10*3/uL (ref 0.7–4.0)
Metamyelocytes Relative: 0 %
Metamyelocytes Relative: 0 %
Monocytes Absolute: 6 10*3/uL — ABNORMAL HIGH (ref 0.1–1.0)
Monocytes Relative: 9 % (ref 3–12)
Monocytes Relative: 8 % (ref 3–12)
Myelocytes: 0 %
Myelocytes: 0 %
Neutro Abs: 56.9 10*3/uL — ABNORMAL HIGH (ref 1.7–7.7)
Neutrophils Relative %: 86 % — ABNORMAL HIGH (ref 43–77)
Neutrophils Relative %: 79 % — ABNORMAL HIGH (ref 43–77)
Promyelocytes Absolute: 0 %
Promyelocytes Absolute: 0 %
WBC Morphology: INCREASED
nRBC: 0 /100 WBC
nRBC: 0 /100 WBC

## 2010-02-07 LAB — CARDIAC PANEL(CRET KIN+CKTOT+MB+TROPI)
CK, MB: 19.9 ng/mL (ref 0.3–4.0)
CK, MB: 18.9 ng/mL (ref 0.3–4.0)
Relative Index: 5.1 — ABNORMAL HIGH (ref 0.0–2.5)
Relative Index: 4.3 — ABNORMAL HIGH (ref 0.0–2.5)
Total CK: 393 U/L — ABNORMAL HIGH (ref 7–232)
Total CK: 440 U/L — ABNORMAL HIGH (ref 7–232)
Troponin I: 2.64 ng/mL (ref 0.00–0.06)
Troponin I: 2.37 ng/mL (ref 0.00–0.06)

## 2010-02-07 LAB — PREPARE PLATELETS: Unit division: 0

## 2010-02-07 LAB — CREATININE, SERUM
Creatinine, Ser: 1.12 mg/dL (ref 0.4–1.5)
Creatinine, Ser: 2.05 mg/dL — ABNORMAL HIGH (ref 0.4–1.5)
GFR calc Af Amer: >60 mL/min (ref >60)
GFR calc Af Amer: 38 mL/min — ABNORMAL LOW (ref >60)
GFR calc non Af Amer: >60 mL/min (ref >60)
GFR calc non Af Amer: 31 mL/min — ABNORMAL LOW (ref >60)

## 2010-02-07 LAB — POCT I-STAT, CHEM 8
BUN: 11 mg/dL (ref 6–23)
Calcium, Ion: 1.15 mmol/L (ref 1.12–1.32)
Chloride: 109 mEq/L (ref 96–112)
Creatinine, Ser: 1 mg/dL (ref 0.4–1.5)
Glucose, Bld: 168 mg/dL — ABNORMAL HIGH (ref 70–99)
HCT: 32 % — ABNORMAL LOW (ref 39.0–52.0)
Hemoglobin: 10.9 g/dL — ABNORMAL LOW (ref 13.0–17.0)
Potassium: 4.9 mEq/L (ref 3.5–5.1)
Sodium: 140 mEq/L (ref 135–145)
TCO2: 22 mmol/L (ref 0–100)

## 2010-02-07 LAB — T3: T3, Total: 56.5 ng/dl — ABNORMAL LOW (ref 80.0–204.0)

## 2010-02-07 LAB — POCT I-STAT 4, (NA,K, GLUC, HGB,HCT)
Glucose, Bld: 114 mg/dL — ABNORMAL HIGH (ref 70–99)
HCT: 37 % — ABNORMAL LOW (ref 39.0–52.0)
Hemoglobin: 12.6 g/dL — ABNORMAL LOW (ref 13.0–17.0)
Potassium: 4 mEq/L (ref 3.5–5.1)
Sodium: 141 mEq/L (ref 135–145)

## 2010-02-07 LAB — PREPARE RBC (CROSSMATCH)

## 2010-02-07 LAB — TISSUE CULTURE: Culture: NO GROWTH

## 2010-02-07 LAB — TYPE AND SCREEN
ABO/RH(D): A POS
Antibody Screen: NEGATIVE
Unit division: 0

## 2010-02-07 LAB — T4: T4, Total: 5.6 ug/dL (ref 5.0–12.5)

## 2010-02-07 LAB — PATHOLOGIST SMEAR REVIEW

## 2010-02-07 LAB — TSH: TSH: 3.49 u[IU]/mL (ref 0.350–4.500)

## 2010-02-09 LAB — BASIC METABOLIC PANEL
BUN: 11 mg/dL (ref 6–23)
CO2: 25 mEq/L (ref 19–32)
Calcium: 8.7 mg/dL (ref 8.4–10.5)
Chloride: 104 mEq/L (ref 96–112)
Creatinine, Ser: 0.88 mg/dL (ref 0.4–1.5)
GFR calc Af Amer: >60 mL/min (ref >60)
GFR calc non Af Amer: >60 mL/min (ref >60)
Glucose, Bld: 131 mg/dL — ABNORMAL HIGH (ref 70–99)
Potassium: 4.1 mEq/L (ref 3.5–5.1)
Sodium: 136 mEq/L (ref 135–145)

## 2010-02-09 LAB — GLUCOSE, CAPILLARY
Glucose-Capillary: 108 mg/dL — ABNORMAL HIGH (ref 70–99)
Glucose-Capillary: 135 mg/dL — ABNORMAL HIGH (ref 70–99)
Glucose-Capillary: 93 mg/dL (ref 70–99)
Glucose-Capillary: 119 mg/dL — ABNORMAL HIGH (ref 70–99)
Glucose-Capillary: 117 mg/dL — ABNORMAL HIGH (ref 70–99)
Glucose-Capillary: 131 mg/dL — ABNORMAL HIGH (ref 70–99)
Glucose-Capillary: 103 mg/dL — ABNORMAL HIGH (ref 70–99)
Glucose-Capillary: 105 mg/dL — ABNORMAL HIGH (ref 70–99)

## 2010-02-09 LAB — CBC
HCT: 28.4 % — ABNORMAL LOW (ref 39.0–52.0)
HCT: 26.8 % — ABNORMAL LOW (ref 39.0–52.0)
Hemoglobin: 9.5 g/dL — ABNORMAL LOW (ref 13.0–17.0)
Hemoglobin: 9 g/dL — ABNORMAL LOW (ref 13.0–17.0)
MCH: 30 pg (ref 26.0–34.0)
MCH: 29.8 pg (ref 26.0–34.0)
MCHC: 33.5 g/dL (ref 30.0–36.0)
MCHC: 33.6 g/dL (ref 30.0–36.0)
MCV: 89.6 fL (ref 78.0–100.0)
MCV: 88.7 fL (ref 78.0–100.0)
Platelets: 210 10*3/uL (ref 150–400)
Platelets: 198 10*3/uL (ref 150–400)
RBC: 3.17 MIL/uL — ABNORMAL LOW (ref 4.22–5.81)
RBC: 3.02 MIL/uL — ABNORMAL LOW (ref 4.22–5.81)
RDW: 15.1 % (ref 11.5–15.5)
RDW: 15.2 % (ref 11.5–15.5)
WBC: 17.8 10*3/uL — ABNORMAL HIGH (ref 4.0–10.5)
WBC: 17.2 10*3/uL — ABNORMAL HIGH (ref 4.0–10.5)

## 2010-02-09 LAB — URINALYSIS, ROUTINE W REFLEX MICROSCOPIC
Bilirubin Urine: NEGATIVE
Bilirubin Urine: NEGATIVE
Hgb urine dipstick: NEGATIVE
Hgb urine dipstick: NEGATIVE
Ketones, ur: 15 mg/dL — AB
Ketones, ur: NEGATIVE mg/dL
Nitrite: NEGATIVE
Nitrite: NEGATIVE
Protein, ur: NEGATIVE mg/dL
Protein, ur: NEGATIVE mg/dL
Specific Gravity, Urine: 1.018 (ref 1.005–1.030)
Specific Gravity, Urine: 1.022 (ref 1.005–1.030)
Urine Glucose, Fasting: NEGATIVE mg/dL
Urine Glucose, Fasting: NEGATIVE mg/dL
Urobilinogen, UA: 0.2 mg/dL (ref 0.0–1.0)
Urobilinogen, UA: 0.2 mg/dL (ref 0.0–1.0)
pH: 6 (ref 5.0–8.0)
pH: 5 (ref 5.0–8.0)

## 2010-02-09 LAB — URINE CULTURE
Colony Count: >=100000
Culture  Setup Time: 201201161549

## 2010-02-09 LAB — HEMOCCULT GUIAC POC 1CARD (OFFICE): Fecal Occult Bld: NEGATIVE

## 2010-02-09 LAB — CLOSTRIDIUM DIFFICILE BY PCR: Toxigenic C. Difficile by PCR: NEGATIVE

## 2010-02-11 ENCOUNTER — Inpatient Hospital Stay (HOSPITAL_COMMUNITY)
Admission: RE | Admit: 2010-02-11 | Discharge: 2010-02-22 | Payer: Self-pay | Attending: Physical Medicine & Rehabilitation | Admitting: Physical Medicine & Rehabilitation

## 2010-02-11 NOTE — Op Note (Signed)
[name], [place]           ACCOUNT NO.:  [id number]  MEDICAL RECORD NO.:  [id number]          PATIENT TYPE:  INP  LOCATION:  2301                         FACILITY:  [facility]  PHYSICIAN:  Sheliah Plane, MD    DATE OF BIRTH:  1927/04/10  DATE OF PROCEDURE:  01/28/2010 DATE OF DISCHARGE:                              OPERATIVE REPORT   PREOPERATIVE DIAGNOSES:  Coronary occlusive disease and critical aortic stenosis.  POSTOPERATIVE DIAGNOSES:  Coronary occlusive disease and critical aortic stenosis.  SURGICAL PROCEDURES:  Aortic valve replacement with pericardial tissue valve, [employer] model 3300TFX 21 mm, serial number [id number] and coronary artery bypass grafting x3 with the left internal mammary to the left anterior descending coronary artery, reverse saphenous vein graft to the first obtuse marginal coronary artery, reverse saphenous vein graft to the posterior descending coronary artery with endovein harvesting.  SURGEON:  Sheliah Plane, MD  FIRST ASSISTANT:  Zadie Rhine, PA  BRIEF HISTORY:  The patient is an 77 year old male who in the fall of this year had presented to [facility] with medical illness involving C. diff colitis and strep bacteremia.  He was treated with antibiotics including p.o. vancomycin and overall improved.  While hospitalized and on evaluation of his illness, he was noted to have very critical aortic stenosis.  After discharge, he was brought back to the hospital by Dr. Allyson Sabal for cardiac catheterization which demonstrated 50- 60% stenosis of the right coronary artery, 50% ostial circumflex and 70% LAD lesion just past the takeoff of a large diagonal.  The current catheterization and previous catheterization several years ago demonstrate 40% left main disease.  His aortic valve area by echo was 0.6 with a velocity of over 4 cm per minute.  From the patient's medical illness, he had stabilized and because of persistent  culture of C. diff in his stool, he has been on a 2-week course of p.o. vancomycin after its previous discontinuation.  Because of the patient's very critical nature and now his improvement, he was referred for replacement of his aortic valve.  Multiple blood cultures have been negative.  It was not thought that he had developed endocarditis.  At this point, the patient had medically improved to the point where it was felt that we should proceed with aortic valve replacement if he had any further similar episodes as he did in the fall.  With his critical aortic stenosis, it was not felt that he would survive.  The risks and options were discussed with his wife and the patient in detail and they were aware of the increased risk of surgery because of his previous medical problems. He agreed and signed an informed consent.  DESCRIPTION OF PROCEDURE:  With Swan-Ganz and arterial line monitors in place, the patient underwent general endotracheal anesthesia.  It was kind of apparent that the patient's Swan-Ganz was not properly positioned and fluoroscopy was used to confirm that it was coiled in the innominate vein and not into the PA.  Attempt to position it with fluoroscopy was only partially successful.  The catheter could be placed into the confluence of the innominate vein and superior vena cava  but would not pass further.  Ultimately after sternotomy was performed and palpation of the superior vena cava could be performed, the Swan-Ganz was positioned properly.  TEE was placed and as noted confirmed critical aortic stenosis, trace mitral regurgitation.  The skin on the chest and legs was prepped with Betadine and draped in usual sterile manner using a [employer] endovein harvesting system.  A segment of vein was harvested from the right thigh and calf.  The vein was of excellent quality. Median sternotomy was performed.  Left internal mammary artery was dissected down as pedicle graft.  The  distal artery was divided; had good free flow.  The pericardium was opened.  The patient had evidence of left ventricular hypertrophy but overall preserved LV function.  He was systemically heparinized.  The ascending aorta was cannulated.  The right atrium was cannulated and aortic root vent cardioplegia needle was introduced into the ascending aorta.  The patient was placed on cardiopulmonary bypass 2.4 liters per minute per meter squared.  Sites were anastomosed, were selected and dissected out of the epicardium.  A right superior pulmonary vein vent was placed.  A retrograde cardioplegia catheter was placed.  The patient's body temperature was cooled to 30 degrees.  Aortic crossclamp was applied.  500 mL of cold blood potassium cardioplegia was administered with diastolic arrest of the heart.  Myocardial septal temperature was monitored throughout the crossclamp.  Attention was turned first to performing coronary bypasses. The very distal circumflex was of relatively small vessel, though the first obtuse marginal which supplied into the circumflex without a lesion was easily identified and a larger vessel using the vessel was opened and admitted a 1.5-mm probe.  Using a running 7-0 Prolene, distal anastomosis was performed.  Attention was then turned to the distal right coronary artery which was significantly calcified.  The proximal posterior descending was opened, admitted a 1.5 mm probe without difficulty.  Using a running 7-0 Prolene, distal anastomosis was performed.  Additional cold blood cardioplegia was administered, both retrograde and down vein grafts.  Attention was then turned to the left anterior descending coronary artery.  In the mid to distal vessel, the LAD was opened, 1.5 mm probe passed distally and proximally.  Using a running 8-0 Prolene, left internal mammary artery was anastomosed to the left anterior descending coronary artery with the crossclamp still  in place.  Attention was then turned to the aortic valve.  A transverse aortotomy was performed.  This gave good exposure of a tricuspid, highly calcified aortic valve.  In addition, there was a significant, more than appreciated on the catheterization films, stenosis of the ostium of the LAD with significant calcium, largest superior to the vessel.  The valve was excised.  Annulus debrided of loose calcific debris.  Valve was sized for a 21 pericardial tissue valve; #2 Tycron pledgeted sutures with pledgets on the ventricular surface were then placed circumferentially around the annulus valve.  These were used to seat the valve which fit well without any impingement on the right or left coronary ostium.  Care was taken to remove all loose calcific debris. With the valve well seated, the  aortotomy was closed with a horizontal mattress 4-0 Prolene suture over felt strips.  A second layer of running 4-0 Prolene was used to close the aortotomy.  The aortic root vent was removed.  Two punch aortotomies were performed and each of the two vein grafts were anastomosed to the ascending aorta.  Before complete closure of the  aortotomies, the heart was allowed to passively fill and de-air. After administration of warm retrograde plegia, the aortic crossclamp was removed with total crossclamp time of 115 minutes.  Bulldog on the mammary artery was removed before the removal of crossclamp and there was appropriate rise in myocardial septal temperature.  Initially, the patient required ventricular pacing wires as the patient was in complete heart block.  The echocardiogram showed good function of the aortic valve.  The right superior pulmonary vein vent was removed.  The patient's body temperature rewarmed to 37 degrees.  Atrial ventricular pacing wires were applied.  He was then ventilated and weaned from cardiopulmonary bypass without difficulty, remaining hemodynamically stable.  Total pump time  was 152 minutes.  The patient's platelet count during his previous illnesses had dropped to as low as 60,000.  He had preoperatively undergone a bone marrow biopsy and seen Dr. Gaylyn Rong for thrombocytopenia.  His platelet count did drop to 90,000.  Because of his known platelet disorder, it was felt prudent to proceed with platelet transfusion which was carried out.  He was decannulated in usual fashion.  Protamine sulfate was administered and with operative field hemostatic, a left pleural tube and Blake mediastinal drain were left in place.  Pericardium was loosely reapproximated.  Sternum closed with #6 stainless steel wire.  Fascia closed with interrupted 0 Vicryl, running 3-0 Vicryl in the subcutaneous tissue, and 4-0 subcuticular stitches in the skin edges.  Dry dressings were applied.  Sponge and needle count was reported as correct at completion of the procedure. The patient tolerated the procedure without obvious complication and was transferred to surgical intensive care unit for further postoperative care.  A portion of the aortic valve leaflet was sent for preop culture. The remainder of the leaflets were sent to Pathology, though there was no gross evidence of endocarditis.     Sheliah Plane, MD     EG/MEDQ  D:  01/30/2010  T:  01/31/2010  Job:  [id number]  cc:   Cliffton Asters, M.D. Nanetta Batty, M.D.Electronically Signed by Sheliah Plane MD on 02/11/2010 01:52:28 PM

## 2010-02-11 NOTE — Discharge Summary (Signed)
[name]           ACCOUNT NO.:  [id number]  MEDICAL RECORD NO.:  [id number]          PATIENT TYPE:  INP  LOCATION:  2005                         FACILITY:  [facility]  PHYSICIAN:  Sheliah Plane, MD    DATE OF BIRTH:  March 30, 1927  DATE OF ADMISSION:  01/28/2010 DATE OF DISCHARGE:                              DISCHARGE SUMMARY   ADMITTING DIAGNOSES: 1. Multivessel coronary artery disease. 2. Critical aortic stenosis. 3. History of hypertension. 4. History of hyperlipidemia. 5. History of type 2 diabetes. 6. History of remote tobacco abuse. 7. History of Clostridium difficile. 8. History of group B Streptococcus. 9. History of thrombocytopenia.  DISCHARGE DIAGNOSES: 1. Multivessel coronary artery disease. 2. Critical aortic stenosis. 3. History of hypertension. 4. History of hyperlipidemia. 5. History of type 2 diabetes. 6. History of remote tobacco abuse. 7. History of Clostridium difficile. 8. History of group B Streptococcus. 9. Postoperative atrial fibrillation. 10.Renal insufficiency (resolved prior to discharge). 11.History of thrombocytopenia (resolved prior to discharge). 12.Leukocytosis (last white count is down to 13,700).  No signs of     wound infection.  No GU complaints.  CBC will be obtained in the     morning prior to discharge.  PROCEDURE:  CABG x3 (LIMA to LAD), SVG to OM1, SVG to posterior descending coronary artery with EVH of the right leg, aortic valve replacement (utilizing an [employer] pericardial tissue valve size 21 mm by Dr. Tyrone Sage on January 28, 2010).  HISTORY OF PRESENT ILLNESS:  This is an 77 year old Caucasian male who this past October was admitted to [facility] with a temperature of 103, white blood cell count of 23,000 and hypotensive.  Blood cultures were indicative of group B Streptococcus.  At that time, on physical exam, he was found to have a heart murmur and an echocardiogram that was done did show  critical aortic stenosis (estimated valve area of 0.6 cm2.  He was given 3-4 weeks of antibiotics, but then had to be readmitted with increasing weakness and diarrhea.  He was then diagnosed with C. diff on December 08, 2009.  He also was found to have thrombocytopenia and bone marrow biopsy was performed; the results were still pending at the time of this admission.  He then gradually began to feel better.  He finishes antibiotics and he then underwent a cardiac catheterization on January 04, 2010 by Dr. Allyson Sabal.  The patient was found to have multivessel coronary artery disease in addition to critical AF.  He was then seen and evaluated by Dr. Tyrone Sage in the office on January 07, 2010.  It was ultimately decided the patient needed to undergo coronary bypass grafting surgery as well as aortic valve replacement.  Potential risks, benefits, and complications were discussed with the patient.  He agreed to proceed.  He was admitted on January 28, 2010 to undergo the aforementioned CABG x3 and AVR with Dr. Tyrone Sage. BRIEF HOSPITAL COURSE STAY:  The patient was extubated without difficulty early the morning of postop day #1.  He initially had some confusion but this quickly resolved and his mental status returned to baseline.  As previously stated, the patient did have  a history of thrombocytopenia.  His platelet count postoperatively went as low as 57,000, however, gradually throughout his hospital course stay began to increase.  His last platelet count was up to 164,000 with resolution of the thrombocytopenia..  In addition, he was initially found to have renal insufficiency (his creatinine went as high as 2.04 but again gradually over his hospital course stay this did resolve and his last creatinine was 0.9.  The patient's Swan-Ganz A-line chest tubes and Foley were removed early in his postoperative course.  He was found to have acute blood loss anemia.  H and H went as low as 7.8 and 22.5.   He was given packed red blood cells.  Followup H and H was then 8.6 and 24.5 and his last H and H on February 06, 2010 was up to 8.9 and 25.9. It should also be noted that the patient's postoperative cardiac enzymes were found to be elevated (troponin 2.6).  It was not felt that he had an acute MI.  An EKG was obtained and these were monitored closely.  The patient was found to be in a flutter.  He was started on a low-dose beta- blocker.  He also apparently had experienced some atrial fibrillation intermittently; however, he did convert to sinus rhythm and remained in sinus rhythm on the Lopressor.  Infectious Disease did evaluate the patient as the patient had a previous history of C. diff colitis.  He was given vancomycin and the last dose was to be given on February 07, 2010.  The patient was felt surgically stable for transfer from the Intensive Care Unit to [facility] for further convalescence on February 04, 2010.  He continued to progress with physical therapy and he was evaluated for inpatient rehab.  Currently, on postop day #9, the patient had Tmax of 99.1, but later became afebrile, heart rate in the 90s, BP 134/70, O2 sat 92-94% on room air.  Preop weight is 96.5 kg.  His weight has not been recorded over the last 2 days despite request to do so.  He is supposed to be weighed today . Chest tube sutures, Picc, and epicardial pacing wires will be removed prior to his discharge.  PHYSICAL EXAMINATION:  CARDIOVASCULAR:  Regular rate and rhythm.  No murmur. PULMONARY:  Slight decrease at the bases. ABDOMEN:  Soft, nontender.  Bowel sounds present. EXTREMITIES:  Mild lower extremity edema, right greater than left. Sternal and lower extremity wounds are clean, dry, and continuing to heal.  The patient has already been tolerating a diet and has had a bowel movement.  Chest tube sutures and epicardial pacing wires will be removed in the morning provided the patient remains  afebrile, hemodynamically stable, in sinus rhythm, and pending morning round evaluation will be surgically stable for discharge to rehab on February 07, 2010.  Latest laboratory studies are as follows; BMET on February 06, 2010: Potassium 4, sodium 138, BUN and creatinine 11 and 0.9 respectively. CBC on this date:  H and H 8.9 and 25.9 respectively; white blood cell count down to 13,700; platelet count 164,000.  Last chest x-ray done on February 05, 2010 showed stable cardiomegaly, bibasilar atelectasis, small bilateral pleural effusions (left greater than right).  No pneumothorax.  Discharge instructions include the following;  DIET:  Low-sodium, heart-healthy diabetic diet.  ACTIVITY:  The patient may walk up steps.  He may shower.  He is not to lift more than 10 pounds for 4 weeks and not to drive until after 4  weeks.  He is to continue with his breathing exercise daily, he is to walk everyday and increase his frequency and duration as tolerated.  WOUND CARE:  To use soap and water on his wound.  He is to contact the office if any wound problems arise.  FOLLOWUP APPOINTMENTS: 1. The patient is to contact Dr. Hazle Coca office for a followup     appointment in 2 weeks. 2. The patient is going to have an appointment made by our office to     see Dr. Tyrone Sage in 3 weeks, and prior to this office appointment,     a chest x-ray will be obtained.  Discharge medications at time of dictation include the following; 1. Folic acid 1 mg p.o. daily. 2. Nu-Iron 150 mg p.o. daily. 3. Lopressor 25 mg p.o. 2 times daily. 4. Ultram 50 mg 1-2 tablets every 8 hours p.r.n. pain. 5. Crestor 5 mg p.o. at bedtime. 6. Enteric-coated aspirin 325 mg p.o. daily. 7. Allopurinol 300 mg p.o. at bedtime. 8. Tylenol 1-2 tabs every 4 hours as needed for mild pain. 9. Icaps 1 tablet p.o. every morning. 10.Lexapro 20 mg p.o. q.a.m. 11.Multivitamin p.o. q.a.m. 12.Omeprazole 20 mg p.o. q.a.m. 13.Vitamin B6 100  mg p.o. q.a.m. 14.Ropinirole 0.25 mg 3 tablets p.o. at bedtime. 15.Vitamin B1 one tablet p.o. daily. 16.Vitamin C p.o. daily. 17.Vitamin D3 1000 units p.o. 2 times daily.  Please note the patient was not started on an ACE inhibitor secondary to blood pressure being well controlled ona  beta-blocker.  This may be started as an outpatient.     Doree Fudge, PA   ______________________________ Sheliah Plane, MD    DZ/MEDQ  D:  02/06/2010  T:  02/07/2010  Job:  [id number]  cc:   Nanetta Batty, M.D.  Electronically Signed by Doree Fudge PA on 02/07/2010 12:15:40 PM Electronically Signed by Sheliah Plane MD on 02/11/2010 01:52:31 PM

## 2010-02-11 NOTE — Discharge Summary (Signed)
[name], [name]           ACCOUNT NO.:  [id number]  MEDICAL RECORD NO.:  [id number]          PATIENT TYPE:  INP  LOCATION:  2005                         FACILITY:  [facility]  PHYSICIAN:  Sheliah Plane, MD    DATE OF BIRTH:  01-19-1928  DATE OF ADMISSION:  01/28/2010 DATE OF DISCHARGE:  02/11/2010                              DISCHARGE SUMMARY   BRIEF HOSPITAL COURSE STAY:  Since last dictation, the patient's white blood cell count did increase to 17,800 on February 07, 2010.  A  UA that was done on 02/07/10, was essentially negative except for 15 ketones; however, a  clean-catch urine culture that was obtained showed greater than 100,000 colonies of multiple bacterial morphotypes.  Another UA and culture were obtained on 02/08/10, secondary to possible contamination.  Again, the UA was essentially negative.  However, the final urine culture did show 60,000 colonies per mL of multiple bacterial morphotypes. This may represent a  contamination.Patient has a history of C. Dif colitis.  Patient is known to Dr. Orvan Falconer (Infectious Disease). He is to be contacted before  starting antibiotics.        On tele, the patient was experiencing paroxysmal atrial fibrillation with increased ventricular rate.  As a result, he was placed back on an amiodarone drip.  He was initially to be placed on amiodarone p.o., however he did develop nausea.  As a result, this was discontinued.  He had been on Cardizem CD 360 mg p.o. daily propertaively.  Cardizem CD was resumed at 180 mg p.o. daily.  He then did maintain sinus rhythm. It should be noted that the patient was thought to not be a good Coumadin  candidate secondary to his risk of fall.  As stated in the last dictation, the patient was treated appropriately for C. diff colitis. This was rechecked on February 08, 2010, and was found to be negative.  In addition, he did have acute blood loss anemia postoperatively. His occult blood stool was  negative.  TSH level was also checked on February 09, 2010, and was found to be within normal limits at 3.9.   The patient continued to progress with cardiac rehab.  PHYSICAL EXAMINATION:  VITAL SIGNS:  Currently on today's exam, the patient is afebrile.  Heart rate is in the 90s to low 100s, SBP between 101 and 173, his preoperative weight was 96.5 kg, today's weight is down to 95 kg, his CBGs were 107, 124, and 114. CARDIOVASCULAR:  Regular rate and rhythm. LUNGS:  Mildly diminished at bases. ABDOMEN:  Soft, benign. EXTREMITIES:  Minor lower extremity edema.  Sternal and lower leg incision with clean, dry, and continuing to heal.  The patient with previous anorexia is slowly improving and has been given Ensure to supplement his diet.  Provided the patient remains afebrile, hemodynamically stable, and pending morning round of evaluation, he will be surgically stable for discharge to the rehab on February 11, 2010.  LABORATORY DATA:  Latest laboratory studies; CBC done on February 09, 2010, H and H 8.2 and 25.1, white count of 14,000, and platelet count of 187,000.  BMET done on February 08, 2010, potassium 4.1,  sodium 136, BUN and creatinine of 11 and 0.8 respectively.  IMAGING DATA:  Last chest x-ray done on February 09, 2010, shows mild bibasilar airspace disease with some improvement in aeration of the left lung, small bilateral pleural effusions.  No pneumothorax, no overt edema.  DISCHARGE MEDICATIONS:  At the time of dictation include the following; folic acid 1 mg p.o. daily, Glucerna 1 can p.o. twice daily, Nu-Iron 150 mg p.o. daily, Lopressor 25 mg p.o. 2 times daily, potassium chloride 10 mEq p.o. daily, Ultram 50 mg 1 p.o. every 8 hours p.r.n. severe pain, Crestor 5 mg p.o. at bedtime, diltiazem 180 mg p.o. daily, enteric- coated aspirin 325 mg p.o. daily, allopurinol 300 mg p.o. at bedtime, Tylenol 325 mg tabs every 4 hours p.r.n. mild pain, Lasix 40 mg one-half tablet p.o.  daily, Icaps tablet p.o. q.a.m., Lexapro 20 mg p.o. q.a.m., multivitamin p.o. daily, omeprazole 29 mg p.o. daily, vitamin B6 one tablet p.o. daily, ropinirole 0.25 mg 2 tablets p.o. at bedtime, thiamine p.o. daily, vitamin C p.o. daily, vitamin D3 1000 p.o. 2 times daily.     Doree Fudge, PA   ______________________________ Sheliah Plane, MD    DZ/MEDQ  D:  02/10/2010  T:  02/10/2010  Job:  [id number]  cc:   Sheliah Plane, MD Brooke Bonito, M.D. Nanetta Batty, M.D.  Electronically Signed by Doree Fudge PA on 02/11/2010 01:02:53 PM Electronically Signed by Sheliah Plane MD on 02/11/2010 01:52:34 PM

## 2010-02-14 LAB — GLUCOSE, CAPILLARY
Glucose-Capillary: 115 mg/dL — ABNORMAL HIGH (ref 70–99)
Glucose-Capillary: 114 mg/dL — ABNORMAL HIGH (ref 70–99)
Glucose-Capillary: 96 mg/dL (ref 70–99)
Glucose-Capillary: 124 mg/dL — ABNORMAL HIGH (ref 70–99)
Glucose-Capillary: 107 mg/dL — ABNORMAL HIGH (ref 70–99)
Glucose-Capillary: 136 mg/dL — ABNORMAL HIGH (ref 70–99)
Glucose-Capillary: 125 mg/dL — ABNORMAL HIGH (ref 70–99)
Glucose-Capillary: 134 mg/dL — ABNORMAL HIGH (ref 70–99)
Glucose-Capillary: 105 mg/dL — ABNORMAL HIGH (ref 70–99)
Glucose-Capillary: 112 mg/dL — ABNORMAL HIGH (ref 70–99)

## 2010-02-14 LAB — URINE CULTURE
Colony Count: 60000
Culture  Setup Time: 201201171632

## 2010-02-14 LAB — CBC
HCT: 25.1 % — ABNORMAL LOW (ref 39.0–52.0)
Hemoglobin: 8.2 g/dL — ABNORMAL LOW (ref 13.0–17.0)
MCH: 29.3 pg (ref 26.0–34.0)
MCHC: 32.7 g/dL (ref 30.0–36.0)
MCV: 89.6 fL (ref 78.0–100.0)
Platelets: 187 10*3/uL (ref 150–400)
RBC: 2.8 MIL/uL — ABNORMAL LOW (ref 4.22–5.81)
RDW: 15.4 % (ref 11.5–15.5)
WBC: 14 10*3/uL — ABNORMAL HIGH (ref 4.0–10.5)

## 2010-02-14 LAB — TSH: TSH: 3.929 u[IU]/mL (ref 0.350–4.500)

## 2010-02-15 LAB — GLUCOSE, CAPILLARY
Glucose-Capillary: 113 mg/dL — ABNORMAL HIGH (ref 70–99)
Glucose-Capillary: 115 mg/dL — ABNORMAL HIGH (ref 70–99)
Glucose-Capillary: 164 mg/dL — ABNORMAL HIGH (ref 70–99)
Glucose-Capillary: 144 mg/dL — ABNORMAL HIGH (ref 70–99)
Glucose-Capillary: 103 mg/dL — ABNORMAL HIGH (ref 70–99)
Glucose-Capillary: 113 mg/dL — ABNORMAL HIGH (ref 70–99)
Glucose-Capillary: 124 mg/dL — ABNORMAL HIGH (ref 70–99)
Glucose-Capillary: 120 mg/dL — ABNORMAL HIGH (ref 70–99)

## 2010-02-15 LAB — CBC
Hemoglobin: 9.9 g/dL — ABNORMAL LOW (ref 13.0–17.0)
MCH: 29.4 pg (ref 26.0–34.0)
MCHC: 32.6 g/dL (ref 30.0–36.0)
Platelets: 207 10*3/uL (ref 150–400)
RBC: 3.37 MIL/uL — ABNORMAL LOW (ref 4.22–5.81)

## 2010-02-15 LAB — DIFFERENTIAL
Basophils Absolute: 0 10*3/uL (ref 0.0–0.1)
Eosinophils Absolute: 0.3 10*3/uL (ref 0.0–0.7)
Lymphocytes Relative: 25 % (ref 12–46)
Lymphs Abs: 2.5 10*3/uL (ref 0.7–4.0)
Monocytes Relative: 19 % — ABNORMAL HIGH (ref 3–12)
Neutro Abs: 5.4 10*3/uL (ref 1.7–7.7)

## 2010-02-15 LAB — COMPREHENSIVE METABOLIC PANEL
Albumin: 3.1 g/dL — ABNORMAL LOW (ref 3.5–5.2)
BUN: 8 mg/dL (ref 6–23)
Calcium: 9.2 mg/dL (ref 8.4–10.5)
Glucose, Bld: 94 mg/dL (ref 70–99)
Potassium: 4.1 mEq/L (ref 3.5–5.1)
Total Protein: 5.8 g/dL — ABNORMAL LOW (ref 6.0–8.3)

## 2010-02-16 LAB — GLUCOSE, CAPILLARY
Glucose-Capillary: 112 mg/dL — ABNORMAL HIGH (ref 70–99)
Glucose-Capillary: 102 mg/dL — ABNORMAL HIGH (ref 70–99)
Glucose-Capillary: 106 mg/dL — ABNORMAL HIGH (ref 70–99)
Glucose-Capillary: 106 mg/dL — ABNORMAL HIGH (ref 70–99)

## 2010-02-17 LAB — GLUCOSE, CAPILLARY
Glucose-Capillary: 106 mg/dL — ABNORMAL HIGH (ref 70–99)
Glucose-Capillary: 94 mg/dL (ref 70–99)
Glucose-Capillary: 159 mg/dL — ABNORMAL HIGH (ref 70–99)

## 2010-02-17 LAB — SURGICAL PCR SCREEN
MRSA, PCR: NEGATIVE
Staphylococcus aureus: NEGATIVE

## 2010-02-18 LAB — GLUCOSE, CAPILLARY
Glucose-Capillary: 92 mg/dL (ref 70–99)
Glucose-Capillary: 106 mg/dL — ABNORMAL HIGH (ref 70–99)

## 2010-02-19 LAB — GLUCOSE, CAPILLARY

## 2010-02-20 LAB — GLUCOSE, CAPILLARY: Glucose-Capillary: 137 mg/dL — ABNORMAL HIGH (ref 70–99)

## 2010-02-21 LAB — GLUCOSE, CAPILLARY

## 2010-02-23 ENCOUNTER — Encounter: Payer: Self-pay | Admitting: Internal Medicine

## 2010-02-24 ENCOUNTER — Ambulatory Visit: Payer: Medicare Other | Attending: Physical Medicine & Rehabilitation | Admitting: Physical Therapy

## 2010-02-24 DIAGNOSIS — R5381 Other malaise: Secondary | ICD-10-CM | POA: Insufficient documentation

## 2010-02-24 DIAGNOSIS — IMO0001 Reserved for inherently not codable concepts without codable children: Secondary | ICD-10-CM | POA: Insufficient documentation

## 2010-02-24 DIAGNOSIS — M6281 Muscle weakness (generalized): Secondary | ICD-10-CM | POA: Insufficient documentation

## 2010-02-24 DIAGNOSIS — R269 Unspecified abnormalities of gait and mobility: Secondary | ICD-10-CM | POA: Insufficient documentation

## 2010-02-24 NOTE — Consult Note (Signed)
[facility]: [facility]   Imported By: Florinda Marker 12/14/2009 11:55:03  _____________________________________________________________________  External Attachment:    Type:   Image     Comment:   External Document

## 2010-02-24 NOTE — Miscellaneous (Signed)
Summary: [name] : Order PICC  [facility] : Order PICC   Imported By: Florinda Marker 12/22/2009 10:34:39  _____________________________________________________________________  External Attachment:    Type:   Image     Comment:   External Document

## 2010-02-24 NOTE — Progress Notes (Signed)
Summary: Care Plan Oversight  Phone Note Outgoing Call   Call placed by: Acey Lav MD,  December 01, 2009 11:53 AM Details for Reason: Care Plan Oversight Summary of Call: [phone] (30 or more min I have supervised home care and/or infusion therapy for this pt, including providing orders for care, review of labs and/or home health care plans, communicating with the home health care professionals and/or patient/caregivers to integrate current information into the medical treatment plan and/or adjust the medical therapy. This supervision has been provided for _32_minutes during the calendar month. Dates for this oversight  11/13/09 thru 11/30/09  Treatment of group a streptococcal bacteremia  Initial call taken by: Acey Lav MD,  December 01, 2009 11:53 AM

## 2010-02-24 NOTE — Assessment & Plan Note (Signed)
Summary: F/U/VS   Primary Hermila Millis:  Adela Lank, MD  CC:  follow-up visit, seeing Dr. Ethelene Hal for back injection, f/u for abnormal MRI, scheduled for heart cath Tuesday, Dec. 13, and 2011. Dr. Allyson Sabal.  History of Present Illness: [name] had group B strep bacteremia on October 17.  He completed 3 weeks of IV Rocephin on November 8.  He felt much better until he developed at [facility] of 102.7 and diarrhea on November 16. He was rehospitalized and found to have C. difficile colitis.  Blood cultures were negative.  He responded promptly to oral vancomycin and completed 14 days of therapy at the first of this month.  He has not had any further fever, diarrhea, or other new problems.  His appetite remains good.  He continues to struggle with his back and leg pain and is scheduled to see Dr. Ethelene Hal soon.  He also underwent a bone marrow examination recently because of his chronic thrombocytopenia in the bone marrow showed no significant abnormalities.  He is also scheduled for cardiac catheterization by Dr. Nanetta Batty next week.  Preventive Screening-Counseling & Management  Alcohol-Tobacco     Smoking Status: quit, one month ago   Current Allergies (reviewed today): MORPHINE Vital Signs:  Patient profile:   77 year old male Height:      70 inches (177.80 cm) Weight:      212.75 pounds (96.70 kg) BMI:     30.64 Temp:     97.7 degrees F (36.50 degrees C) oral Pulse rate:   76 / minute BP sitting:   177 / 80  (left arm) Cuff size:   large  Vitals Entered By: [name] RN (December 30, 2009 11:08 AM) CC: follow-up visit, seeing Dr. Ethelene Hal for back injection, f/u for abnormal MRI, scheduled for heart cath Tuesday, Dec. 13, 2011. Dr. Allyson Sabal Is Patient Diabetic? Yes Pain Assessment Patient in pain? no      Nutritional Status BMI of > 30 = obese Nutritional Status Detail appetite "very good"  Have you ever been in a relationship where you felt threatened, hurt or  afraid?not asked family present   Does patient need assistance? Functional Status Self care Ambulation Impaired:Risk for fall Comments uses rolling walker   Physical Exam  General:  alert and well-nourished.   Lungs:  normal breath sounds, no crackles, and no wheezes.   Heart:  normal rate, regular rhythm, and grade  2/6 systolic murmur is unchanged.   Abdomen:  soft, non-tender, and normal bowel sounds.     Impression & Recommendations:  Problem # 1:  CLOSTRIDIUM DIFFICILE COLITIS (ICD-008.45) His diarrhea has responded nicely to oral vancomycin and has resolved.  However he is still at risk for early relapse.  I have told him to talk to his doctors if any antibiotics are recommended in the near future as they may trigger a relapse.  In the past he has received prophylactic antibiotics before routine dental cleanings because of his artificial hip and murmur but this is no longer indicated and I would avoid it.  He knows to call me if he has recurrent diarrhea or fever. Orders: Est. Patient Level III (16109)  Problem # 2:  GROUP B STREPTOCOCCUS INFECTION (ICD-041.02) His group B strep bacteremia has resolved. Orders: Est. Patient Level III (60454)  Patient Instructions: 1)  Please schedule a follow-up appointment as needed.

## 2010-02-24 NOTE — Miscellaneous (Signed)
Summary: [facility]: CBCno Diff  [facility]: CBCno Diff   Imported By: Florinda Marker 12/14/2009 11:53:37  _____________________________________________________________________  External Attachment:    Type:   Image     Comment:   External Document

## 2010-02-24 NOTE — Letter (Signed)
Summary: [facility]   Imported By: Sherian Rein 12/27/2009 14:05:23  _____________________________________________________________________  External Attachment:    Type:   Image     Comment:   External Document

## 2010-02-24 NOTE — Consult Note (Signed)
Summary: [facility]  [facility]   Imported By: Florinda Marker 12/29/2009 11:18:17  _____________________________________________________________________  External Attachment:    Type:   Image     Comment:   External Document

## 2010-02-24 NOTE — Progress Notes (Signed)
  Phone Note Other Incoming   Caller: patient's daughter---Lisa [phone] Summary of Call: Misty Stanley left message to inform that her father went to [facility] for chills and fever which she believes that he is getting the strep infection once again.

## 2010-02-24 NOTE — Progress Notes (Signed)
Summary: C.diff pt. with recurrent diarrhea  Phone Note Call from Patient Call back at Home Phone [phone]   Caller: Spouse Call For: [name] Shorter, MD Reason for Call: Acute Illness Summary of Call: C. diff pt. now having diarrhea again per the spouse.  He is scheduled for open heart surgery on 01/28/09.  Wanting to know what to do.  Would like to speak to Dr. Orvan Falconer.  MD paged with pt's tel. #[name] RN  January 10, 2010 11:14 AM Spoke with wife.  Pt. had gallbladder surgery last summer and has developed a dairy intolerance.  He has had dairy products daily since last week and his wife if attributing the diarrhea to that.  She states "it doesn't smell like C. Diff.  RN advised clear liquids for the next 24 hours.  The wife said that she would also give him Immodium.  RN asked her to call back in 1-2 days to let Dr. Orvan Falconer know how the pt. is doing, sooner if symptoms worsen.  The wife stated she understood these instructions and would call back. [name] RN  January 10, 2010 3:58 PM   Follow-up for Phone Call        I spoke with [name] who suspects that his problem is due to lactose intolerance rather than a recurrence of the Clostridium difficile colitis.  This may be but I would prefer that he not take Imodium since it can make Clostridium difficile colitis much more severe.  If he is not improving off of milk products she knows to call me right away so we can have him submit a stool specimen for Clostridium difficile PCR. Follow-up by: Cliffton Asters MD,  January 11, 2010 9:36 AM

## 2010-02-24 NOTE — Progress Notes (Signed)
Summary: FYI  re: [facility] visit, vancomycin p.o. x 14 days, PA obtained  Phone Note Call from Patient   Caller: Spouse Summary of Call: Pt was taken to [facility] ED last night per Dr Ninetta Lights. Wife  says stool cultures were done and he was sent home.  Tomasita Morrow RN  January 12, 2010 4:09 PM  Initial call taken by: Tomasita Morrow RN,  January 12, 2010 4:09 PM  Follow-up for Phone Call        Even though the emergency room personnel were aware of his recent Clostridium difficile colitis no stool specimen was collected for Clostridium difficile PCR.  I spoke with his wife this morning.  He has not had any diarrhea overnight but I requested that she come by to pick up a stool specimen cup in case the diarrhea returns.  If it does return he will bring back a sample for PCR testing and I will consider restarting oral vancomycin. Follow-up by: Cliffton Asters MD,  January 13, 2010 10:01 AM  Additional Follow-up for Phone Call Additional follow up Details #1::        He is having mild intermittent diarrhea and the C diff PCR is positive again. I will retreat with oral vancomycin.    Additional Follow-up for Phone Call Additional follow up Details #2::    Prior authorization recieved from [employer] and [facility] [address]   [facility] is ready for pick-up.  PA [id number]   [name] RN  January 20, 2010 2:16 PM   New/Updated Medications: VANCOCIN HCL 125 MG CAPS (VANCOMYCIN HCL) Take 1 capsule by mouth four times a day Prescriptions: VANCOCIN HCL 125 MG CAPS (VANCOMYCIN HCL) Take 1 capsule by mouth four times a day  #56 x 0   Entered by:   [name] RN   Authorized by:   Cliffton Asters MD   Signed by:   [name] RN on 01/19/2010   Method used:   Telephoned to ...       [facility]  [address] [phone]* (retail)       [address]       [place], [place]  [id number]       Ph: [phone] or [phone]       Fax:  [phone]   RxID:   [id number]

## 2010-02-24 NOTE — Assessment & Plan Note (Signed)
Summary: hsfu need chart/group b strep bacteremia   Vital Signs:  Patient profile:   77 year old male Height:      70 inches (177.80 cm) Weight:      219 pounds (99.55 kg) BMI:     31.54 Temp:     97.6 degrees F (36.44 degrees C) oral Pulse rate:   69 / minute BP sitting:   138 / 70  (left arm) Cuff size:   large  Vitals Entered By: [name] RN (November 30, 2009 11:21 AM) CC: HSFU, diarrhea with BMs Is Patient Diabetic? Yes Did you bring your meter with you today? not checked this AM Pain Assessment Patient in pain? yes     Location: general body aches Intensity: 2 Type: aching Nutritional Status BMI of > 30 = obese Nutritional Status Detail APPETTIE "WONDERFUL"  Have you ever been in a relationship where you felt threatened, hurt or afraid?NOT ASKED TODAY, FAMILY PRESENT   Primary Samson Ralph:  Adela Lank, MD  CC:  HSFU and diarrhea with BMs.  History of Present Illness: [name] is an 77 year old white solid he was hospitalized last month for sudden onset of fever.  He was admitted to the hospital and one of two blood cultures grew group B strep.  He has aortic stenosis, spinal stenosis and a left prosthetic hip but he did not appear to have any complications of his bacteremia.  He improved promptly with antibiotics and the plan was to treat him with 3 weeks of Rocephin as an outpatient.  He completed that therapy yesterday.  He has not had any problems with his PICC.  He's had no more fever.  He still feels extremely fatigued but it is unclear if this is related to his recent bacteremia or due to his severe aortic stenosis.  He is scheduled for a cardiac catheterization in early December and is considering possible valve replacement.  Preventive Screening-Counseling & Management  Alcohol-Tobacco     Smoking Status: quit, one month ago  Current Medications (verified): 1)  Dipyridamole 50 Mg Tabs (Dipyridamole) .... Take 1 Tablet By Mouth Two Times A  Day 2)  Starlix 120 Mg Tabs (Nateglinide) .... Take 1 Tab Before Meals 3)  Allopurinol 300 Mg Tabs (Allopurinol) .... Take 1 Tablet By Mouth Once A Day in The Evening 4)  Ropinirole Hcl 0.25 Mg Tabs (Ropinirole Hcl) .... Take 3 Tabs By Mouth At Bedtime 5)  Celebrex 200 Mg Caps (Celecoxib) .... Take 1 Tablet By Mouth Once A Day 6)  Cardizem La 360 Mg Xr24h-Tab (Diltiazem Hcl Coated Beads) .... Take 1 Tablet By Mouth Once A Day in The Morning 7)  Azor 5-40 Mg Tabs (Amlodipine-Olmesartan) .... Take 1 Tablet By Mouth Once A Day 8)  Lexapro 20 Mg Tabs (Escitalopram Oxalate) .... Take 1 Tablet By Mouth Once A Day 9)  Crestor 10 Mg Tabs (Rosuvastatin Calcium) .... Take 1 Tablet By Mouth Once A Day 10)  Omeprazole 20 Mg Tbec (Omeprazole) .... Take 1 Tablet By Mouth Once A Day in The Morning 11)  Zantac 75 75 Mg Tabs (Ranitidine Hcl) .... Take 1 Tablet By Mouth Once A Day in The Evening 12)  Furosemide 40 Mg Tabs (Furosemide) .... 1/2 Tab By Mouth Once Daily 13)  Adult Aspirin Ec Low Strength 81 Mg Tbec (Aspirin) .... Take 1 Tablet By Mouth Once A Day 14)  Stool Softener 100 Mg Caps (Docusate Sodium) .... As Needed 15)  Vitamin B-6 100 Mg Tabs (Pyridoxine  Hcl) .... Take 1 Tablet By Mouth Once A Day 16)  Icaps  Caps (Multiple Vitamins-Minerals) .... Take 1 Tablet By Mouth Once A Day 17)  Vitamin C 500 Mg Tabs (Ascorbic Acid) .... Take 1 Tablet By Mouth Once A Day 18)  Vitamin D 1000 Unit Tabs (Cholecalciferol) .... Take 1 Tablet By Mouth Once A Day 19)  Multivitamins  Tabs (Multiple Vitamin) .... Take 1 Tablet By Mouth Once A Day 20)  Calcium 500 Mg Tabs (Calcium Carbonate) .... Take 1 Tablet By Mouth Once A Day  Allergies: 1)  Morphine  Physical Exam  General:  alert and well-nourished.  he has had no change in his weight. Lungs:  normal breath sounds, no crackles, and no wheezes.   Heart:  normal rate and regular rhythm.  he has a harsh 3/6 systolic murmur heard best at the right upper sternal  border.  I do not hear any diastolic murmurs. Extremities:  his left prosthetic hip does not show any evidence of infection.   Impression & Recommendations:  Problem # 1:  GROUP B STREPTOCOCCUS INFECTION (ICD-041.02) I suspect that his transient and group B strep bacteremia has been cured.  I will stop his Rocephin, have his PICC pulled and repeat his blood cultures in 3 weeks.  As long as he has no further symptoms of infection and the repeat blood cultures are negative I feel it would be safe to proceed with cardiac catheterization in one month. Orders: Est. Patient Level III (99213)Future Orders: T-Culture, Blood Routine ([id number]) ... 12/20/2009 T-Culture, Blood Routine ([id number]) ... 12/20/2009  Patient Instructions: 1)  Please schedule a follow-up appointment in 1 month.   Orders Added: 1)  Est. Patient Level III [29562] 2)  T-Culture, Blood Routine [[id number]] 3)  T-Culture, Blood Routine [[id number]]

## 2010-02-24 NOTE — Miscellaneous (Signed)
Summary: HIPAA Restrictions  HIPAA Restrictions   Imported By: Florinda Marker 12/01/2009 10:25:50  _____________________________________________________________________  External Attachment:    Type:   Image     Comment:   External Document

## 2010-02-28 ENCOUNTER — Ambulatory Visit: Payer: Medicare Other | Admitting: Physical Therapy

## 2010-03-03 ENCOUNTER — Ambulatory Visit: Payer: Medicare Other | Admitting: Physical Therapy

## 2010-03-04 ENCOUNTER — Other Ambulatory Visit: Payer: Self-pay | Admitting: Cardiothoracic Surgery

## 2010-03-04 ENCOUNTER — Encounter: Payer: Self-pay | Admitting: Internal Medicine

## 2010-03-04 DIAGNOSIS — I2510 Atherosclerotic heart disease of native coronary artery without angina pectoris: Secondary | ICD-10-CM

## 2010-03-07 ENCOUNTER — Encounter: Payer: Self-pay | Admitting: Internal Medicine

## 2010-03-07 ENCOUNTER — Ambulatory Visit
Admission: RE | Admit: 2010-03-07 | Discharge: 2010-03-07 | Disposition: A | Discharge status: Home / Self Care | Payer: Medicare Other | Source: Ambulatory Visit | Attending: Cardiothoracic Surgery | Admitting: Cardiothoracic Surgery

## 2010-03-07 ENCOUNTER — Encounter (INDEPENDENT_AMBULATORY_CARE_PROVIDER_SITE_OTHER): Payer: Self-pay

## 2010-03-07 DIAGNOSIS — I2510 Atherosclerotic heart disease of native coronary artery without angina pectoris: Secondary | ICD-10-CM

## 2010-03-07 DIAGNOSIS — I359 Nonrheumatic aortic valve disorder, unspecified: Secondary | ICD-10-CM

## 2010-03-08 ENCOUNTER — Ambulatory Visit: Payer: Medicare Other | Admitting: Physical Therapy

## 2010-03-08 NOTE — Assessment & Plan Note (Unsigned)
OFFICE VISIT  [name], [name] DOB:  27-Jun-1927                                        March 07, 2010 CHART #:  [id number]  HISTORY OF PRESENT ILLNESS:  The patient is status post aortic valve replacement with a 21-mm pericardial tissue valve as well as coronary artery bypass grafting x3 done by Dr. Tyrone Sage on January 28, 2010. During the patient's postoperative course, he did develop atrial fibrillation as well as renal insufficiency.  He converted back to sinus rhythm with meds and his renal insufficiency resolved prior to discharge.  He was ultimately discharged to [facility] for deconditioning postoperatively.  The patient stayed about 11 days there and was discharged to home with his wife.  In [facility], the patient was up ambulating 250 feet with rolling walker and he was independent with all transfers.  The patient presents today for his followup visit following surgery.  The patient and his wife feel that he is making slight progress.  He is up ambulating at home without the walker, but will go for walks using the walker.  He is steady on his feet.  He is able to get in and out of bed by himself.  He is still working with outpatient physical therapy at this time.  Once they feel that the patient has progressed further, he will be referred to outpatient cardiac rehab.  The patient saw Dr. Allyson Sabal last Friday who felt that he was progressing well.  He did not change any of his meds. He is switching his primary care physician to Rene Paci and plans to see her in March.  He has a followup appointment with Dr. Allyson Sabal in another 2 months.  The wife states he is going to have 2 tests done, but she cannot remember which tests.  The patient is tolerating diet well. No nausea or vomiting noted.  He has been monitoring his blood sugars and he is noted to be stable.  He does note that with swallowing larger pills he is having  difficulty.  He does recall that he did have this prior to surgery as well.  The patient denies any fevers or opening or drainage from any of his incision sites.  The patient does not drive and has not driven since 2006.  PHYSICAL EXAMINATION:  Vital Signs:  Blood pressure of 124/71, pulse of 80, respirations 16, and O2 sats 96% on room air.  Respiratory: Diminished breath sounds at right base.  Cardiac:  Regular rate and rhythm.  No murmurs, gallops, or rubs noted.  Chest:  Sternum noted to be stable.  Abdomen:  Benign.  Positive bowel sounds x4.  Soft and nontender.  Extremities:  No edema noted.  Warm and well perfused. Incisions:  All incisions are clean, dry, and intact and healing well.  STUDIES:  The patient had a PA and lateral chest x-ray obtained today which shows to be stable.  He has a small right pleural effusion noted with a small left pleural effusion.  All sternal wires are intact.  No signs of pneumonia or pneumothorax.  IMPRESSION AND PLAN:  The patient is progressing well following aortic valve replacement/coronary artery bypass grafting.  At this time, recommended to continue with outpatient physical therapy and following outpatient physical therapy progressed to outpatient cardiac rehab.  The patient is to continue  at home ambulating 3-4 times per day as tolerated.  He is to continue using his incentive spirometer.  We will continue all the patient's current medications with the exception of stopping iron as well as folic acid.  The patient has been complaining of constipation secondary to iron and he is currently taking a multivitamin.  He is told still no heavy lifting over 10 pounds for another month.  He is to keep his appointments with Dr. Allyson Sabal as well as Rene Paci as directed.  As recommended to the patient to crush his pills those allowed and try with applesauce.  We will bring the patient back in 3 weeks with a repeat PA and lateral chest x-ray to  see Dr. Tyrone Sage.  In the interim if the patient has any surgical issues or concerns, he is to contact us and we can see him sooner.  The patient and wife are in agreement.  Sol Blazing, PA  KMD/MEDQ  D:  03/07/2010  T:  03/08/2010  Job:  [id number]  cc:   Nanetta Batty, M.D. Valerie A. Felicity Coyer, MD Brooke Bonito, M.D.

## 2010-03-08 NOTE — H&P (Signed)
[name], [name]           ACCOUNT NO.:  [id number]  MEDICAL RECORD NO.:  [id number]          PATIENT TYPE:  IPS  LOCATION:  4028                         FACILITY:  [facility]  PHYSICIAN:  Ranelle Oyster, M.D.DATE OF BIRTH:  [birth year]  DATE OF ADMISSION:  02/11/2010 DATE OF DISCHARGE:                             HISTORY & PHYSICAL   CHIEF COMPLAINT:  Weakness and confusion.  HISTORY OF PRESENT ILLNESS:  This is an 77 year old white male with CAD, critical aortic stenosis who was admitted January 28, 2010, for a CABG x3, and aortic valve replacement by Dr. Tyrone Sage.  He was extubated on January 29, 2010, and has had issues with confusion.  He had history of relapsed C. diff and was continued on oral vancomycin taper, has had some intermittent aflutter, which has been monitored.  Anemia is treated with 2 units of packed red blood cells.  Developed a rapid rate AFib and started on amiodarone, but developed nausea and was changed over to Cardizem.  He is felt to be at high fall risk, so Coumadin was not initiated.  He did develop leukocytosis of 39,000 on February 10, 2010, urine cultures have been negative.  He has been afebrile.  He completed his course of vancomycin on February 07, 2010.  The patient has been followed by the rehab team and has felt that he could benefit from inpatient stay.  REVIEW OF SYSTEMS:  Notable for the above as well as legal blindness, decreased hearing, low back pain, leg pain.  Full 12-point review is in the written H and P.  PAST MEDICAL HISTORY:  Notable for the above as well as diabetes, gout, restless legs syndrome, depression, prostate cancer with prostatectomy, obstructive sleep apnea with CPAP, diabetic neuropathy, severe spinal stenosis, chronic thrombocytopenia, lap cole in July 2011, strep bacteremia on October 2011, total hip replacement on left in 2006, optic nerve infarct, history of severe C. diff colitis in November 2011, history for  horseshoe kidney.  FAMILY HISTORY:  Positive for CAD.  Cerebral hemorrhage.  SOCIAL HISTORY:  The patient is married, lives with tri-level house with one step to enter, three steps to bathroom.  He quit smoking in July and occasionally drinks.  ALLERGIES:  MORPHINE.  HOME MEDICATIONS AND LABORATORY DATA:  Please see written history and physical.  PHYSICAL EXAMINATION:  VITAL SIGNS:  Blood pressure is 134/70, pulse 87, respiratory rate 20, temperature 97.3. GENERAL:  The patient is pleasant, alert and oriented x3. HEENT:  Pupils equal, round, and reactive to light.  Ear, nose, and throat exam is unremarkable except for borderline dentition.  Mucosa is pink and moist. NECK:  Supple without JVD or lymphadenopathy. CHEST:  Clear with decreased air movement as a whole. HEART:  Regular without rubs or gallops. ABDOMEN:  Soft, nontender.  Bowel sounds are positive. SKIN:  Notable for some chronic changes in few abrasions and bruises. He does have trace edema in left and right lower extremities in the pretibial region. NEUROLOGIC:  The patient's vision is significantly impaired, although he is able to read the numbers and large letters up close.  Reflexes are 1+.  Sensation decreased in distal limbs  in stocking-glove distribution. Judgment, orientation, memory, and mood were all fairly appropriate. The patient did have a few visual hallucinations while in the room.  He complained of some small objects floating in the air that he could not quite catch.  Strength in the upper extremities is 4-5/5 proximal distal.  Lower extremity strength is also 4/5 proximal distal.  POSTADMISSION PHYSICIAN EVALUATION: 1. Functional deficit secondary to deconditioning and encephalopathy     after CABG for an aortic valve replacement.  This surgery was     necessitated due to severe aortic stenosis and coronary artery     disease. 2. The patient is admitted to receive collaborative interdisciplinary      care between the physiatrist, rehab nursing staff, and therapy     team. 3. The patient's level of medical complexity and substantial therapy     needs in context of that medical necessity cannot be provided at a     lesser intensity of care. 4. The patient has experienced substantial functional loss from his     baseline.  Premorbidly, he was independent using occasional     supervision with ADLs.  Judging by the patient's diagnosis,     physical exam, and functional history, he is potential for     functional progress, which will result in measurable gains while in     inpatient rehab.  These gains will be of substantial and practical     use upon discharge to home in facilitating mobility and self-care.     Interim changes in medical status since our preadmission consult     are detailed above. 5. His physiatrist will provide 24-hour management, medical needs, as     well as oversight of therapy plans/treatment, and provide guidance     as appropriate regarding interaction of the two.  Medical problem     list and plan are below. 6. A 24-hour rehab nursing team will assist in the management of the     patient's skin care needs as well as bowel and bladder function,     safety, integration of therapy concepts, and monitoring of     breathing, etc. 7. PT will assess and treat for lower extremity strength, range of     motion, adaptive techniques, equipment, pulmonary and physical     endurance with goals, supervision to modified independent. 8. OT will assess and treat for upper extremity use, ADLs, adaptive     techniques, equipment, functional mobility, safety with goals     modified independent. 9. Speech Language Pathology will follow up for the patient's     cognitive deficits and evaluate and treat as needed to achieve     supervision to modified independent goals. 10.Case management and social worker will assess and treat for     psychosocial issues and discharge  planning. 11.Team conference will be held weekly to assess progress towards     goals and to determine barriers at discharge. 12.The patient demonstrated sufficient medical stability, exercise     capacity to tolerate at least 3 hours therapy per day at least 5     days per week. 13.Estimated length of stay is 7 days.  Prognosis is good.  MEDICAL PROBLEM LIST AND PLAN: 1. DVT prophylaxis subcu Lovenox.  Also encouraged ambulation.  Likely     can stop this soon. 2. Pain management p.r.n. Ultram.  Follow for any cognitive side     effects closely. 3. Mood:  The patient is on Lexapro.  Provide ego support as     appropriate. 4. Diabetes type 2.  Will resume oral glycemic agents upon admission     as sugars have been trending up. 5. History of C. diff colitis:  Avoid antibiotics as possible.  He has     completed a course of p.o. vancomycin. 6. Gout:  Allopurinol. 7. Paroxysmal atrial fibrillation:  The patient is back on Cardizem     and metoprolol.  He is off AMIODARONE due to intolerance. 8. Restless legs syndrome, ReQuip at bedtime. 9. Acute blood loss anemia:  He is status post transfusion.  We will     check his serial labs and continue with iron supplementation. 10.Leukocytosis:  This may be reactive.  No clinical signs and     symptoms of infection.  We will follow for any     further workup as needed appropriate. 11.History of sleep apnea:  CPAP at bedtime.  The patient will use his     home mask with the hospital unit to achieve adequate pressures.     Ranelle Oyster, M.D.     ZTS/MEDQ  D:  02/11/2010  T:  02/12/2010  Job:  [id number]  Electronically Signed by Faith Rogue M.D. on 03/08/2010 04:32:10 PM

## 2010-03-08 NOTE — Discharge Summary (Signed)
[name], [name]           ACCOUNT NO.:  [id number]  MEDICAL RECORD NO.:  [id number]          PATIENT TYPE:  IPS  LOCATION:  4028                         FACILITY:  [facility]  PHYSICIAN:  Ranelle Oyster, M.D.DATE OF BIRTH:  07/07/1927  DATE OF ADMISSION:  02/11/2010 DATE OF DISCHARGE:  02/22/2010                              DISCHARGE SUMMARY   DISCHARGE DIAGNOSES: 1. Deconditioning/coronary artery bypass graft and aortic valve     replacement. 2. Diabetes mellitus type 2. 3. Gout. 4. Paroxysmal atrial fibrillation. 5. Obstructive sleep apnea.  HISTORY OF PRESENT ILLNESS:  [name] is an 77 year old male with history of coronary artery disease with critical aortic stenosis, diabetes mellitus, admitted on January 28, 2010, for CABG x3 with AVR by Dr. Tyrone Sage, extubated on January 29, 2010, and has had some issues with confusion.  The patient with history of relapsed C. diff and continued on oral vancomycin taper.  He has had issues with atrial flutter as well as EKG with some ST changes, but suprahepatic inferior vena cava, doubt the patient has STEMI.  Acute blood loss anemia treated with 2 units of packed red blood cells.  The patient with rapid rate AFib and was started on amiodarone, but due to side effects of nausea, this was discontinued and the patient was changed over to p.o. Cardizem.  He is a high fall risk and not a Coumadin candidate.  Leukocytosis was noted with a white count of 239.6 on February 10, 2010.  Urine cultures x2 have been negative.  The patient has been afebrile.  Completed course of p.o. vancomycin on February 07, 2010.  Therapies initiated and the patient was noted to be deconditioned requiring rest breaks with mobility as well as cues for posture.  The patient was evaluated by rehab and felt that she would benefit from a CIR program.  PAST MEDICAL HISTORY:  Significant for: 1. DM type 2 with diabetic neuropathy. 2. Optic nerve infarct with  decreased vision. 3. History of severe spinal stenosis L4-L5 with HNP involving L3 nerve     root. 4. Chronic thrombocytopenia. 5. Lap coli in July 2012. 6. Strep bacteremia in October 2011. 7. Severe C. diff colitis in November 2011. 8. Depression. 9. Restless legs syndrome. 10.Gout. 11.Prostate cancer with prostatectomy. 12.Obstructive sleep apnea with use of CPAP. 13.Left total hip replacement in 2006. 14.Left inguinal hernia repair. 15.History of horseshoe kidney.  ALLERGIES:  MORPHINE.  REVIEW OF SYMPTOMS:  Positive for blindness, decreased hearing, wound care issues, lumbago as well as weakness.  FAMILY HISTORY:  Positive for coronary artery disease and cerebral hemorrhage.  SOCIAL HISTORY:  The patient is married, lives in tri-level home with one step at entry and three steps to bedroom.  Quit tobacco in July 2011.  Uses alcohol occasionally.  FUNCTIONAL HISTORY:  The patient was independent prior to admission without use of assistive device.  He was supervision for ADLs.  FUNCTIONAL STATUS:  Min guard assist for bed mobility and transfers, min guard assist ambulating 275 feet with rolling walker, noted to have decreased posture.  PHYSICAL EXAMINATION:  VITAL SIGNS:  Blood pressure 134/70, pulse 87, respiratory rate 20, temperature  97.3. GENERAL:  The patient is pleasant male, alert and oriented x3. HEENT:  Pupils equal, round, reactive to light.  Oral mucosa is pink and moist with fair dentition.  Nares patent. NECK:  Supple without JVD or lymphadenopathy. LUNGS:  Decreased air movement throughout. HEART:  Regular rate and rhythm without gallops or rubs.  Positive murmur. ABDOMEN:  Soft, nontender with positive bowel sounds. SKIN:  Notable for chronic changes with a few abrasion and bruises.  The patient has trace edema on left lower extremity and 1+ on right lower extremity in pretibial regions. NEUROLOGIC:  The patient's vision is significantly impaired,  although he is able to read numbers and large letters close up.  Reflexes 1+. Sensation decreased in distal limb in stocking-glove distribution. Judgment, orientation, memory, and mood are fairly appropriate.  The patient with a few visual hallucinations while in the room.  He complained of some small objects floating in the air that he could not catch.  Strength in upper extremities 4-5/5 proximal to distal, left lower extremity is 4/5 proximal to distal.  HOSPITAL COURSE:  [name] was admitted to rehab on February 11, 2010, for inpatient therapies to consist of PT, OT at least 3 hours 5 days a week.  Past admission physiatrist rehab RN and therapy team have worked together to provide customized collaborative interdisciplinary care.  The patient's blood pressures were checked on b.i.d. basis during this stay.  These are ranging from 120s-130s systolic, 60s-70s diastolic.  Daily weights were done due to issues with fluid overload.  These are stabilizing out with weights at 89 kg.  Labs done past admission included check of CBC showing H and H at 9.9 and 30.4, white count 10.1, platelets 207.  Check of lytes revealed sodium 140, potassium 4.1, chloride 103, CO2 of 26, BUN 8, creatinine 1.0, glucose 94.  The patient's leukocytosis was noted to be resolving. The patient has had no episodes of loose stools during this stay. Initially, the patient with difficulty utilizing CPAP at nighttimes.  O2 was added to CPAP and this was discontinued on February 18, 2010. Currently, the patient is tolerating CPAP on room air without difficulty.  The patient's wounds have been monitored along, these are healing well without any signs or symptoms of infection.  During the patient's stay in rehab, weekly team conference was held to monitor the patient's progress and goals as well as discuss barriers to discharge.  At the time of admission, the patient was noted to be limited by decreased  strength, decreased coordination as well as decreased activity tolerance.  He was also limited by sternal precautions.  The patient was at min guard assist for ambulating up 225 feet with rolling walker.  Physical Therapy has been working on transfers from sit to stand with minimal use of hands.  The patient has currently progressed to being modified independent with all transfers as well as mobility.  He is ambulating 250 feet with rolling walker.  He is able to navigate 12 steps with 2 rails at modified independent level. The patient will discharge to home with his wife using rolling walker in community setting for endurance and no assisted device in home environment.  He will continue with balance training on outpatient PT until cardiac rehab is initiated.  The patient and wife have been educated on sternal precautions and exercise tolerance.  Wife is to provide care as needed past discharge.  OT has been working with the patient on self-care tasks.  The  patient has made excellent progress during this stay and is currently modified independent for bathing, dressing, toilet transfers, and toileting as well as shower transfers. He requires supervision for grooming due to visual deficits.  He continues to require min verbal cues for sternal precautions at times. The patient's wife to provide assistance supervision with ADLs past discharge.  On February 22, 2010, the patient is discharged to home.  DISCHARGE MEDICATIONS: 1. Protonix 40 mg a day. 2. Ocuvite 1 p.o. per day. 3. Folic acid 1 mg a day. 4. Ecotrin 325 mg a day. 5. Vitamin B6, 100 mg a day. 6. Vitamin C 500 mg a day. 7. Multivitamin 1 per day. 8. Vitamin D 1000 units p.o. b.i.d. 9. Nu-Iron 150 mg b.i.d. 10.Crestor 5 mg p.o. nightly. 11.Cardizem CD 180 mg per day. 12.Lopressor 25 mg p.o. b.i.d. 13.Lexapro 20 mg p.o. per day. 14.Allopurinol 300 mg p.o. per day. 15.K-Dur 20 mEq a day. 16.Colace 200 mg p.o.  nightly. 17.ReQuip 0.75 mg p.o. nightly. 18.Tylenol 650 mg p.o. nightly. 19.Lasix 20 mg p.o. per day. 20.Klonopin 0.25 mg p.o. q.p.m. prior to sleep.  DIET:  Carb modified medium with heart healthy restrictions.  ACTIVITY LEVEL:  At intermittent supervision.  Use walker on supervised setting.  No strenuous activity.  SPECIAL INSTRUCTIONS:  No alcohol, no smoking, no driving.  Check blood sugars on b.i.d. basis and record.  Wear CPAP nightly.  Keep routine monitoring of wounds, wash area with soap and water, pat dry.  FOLLOWUP:  The patient to follow up with Dr. Jaynie Collins in 2 weeks.  Follow up with Dr. Felicity Coyer for routine check in the next few weeks.  Follow up with Dr. Riley Kill as needed.  Follow up with Dr. Allyson Sabal in a few weeks.     Delle Reining, P.A.   ______________________________ Ranelle Oyster, M.D.    PL/MEDQ  D:  02/21/2010  T:  02/22/2010  Job:  [id number]  cc:   Vikki Ports A. Felicity Coyer, MD Sheliah Plane, MD Nanetta Batty, M.D.  Electronically Signed by Osvaldo Shipper. on 02/23/2010 03:27:02 PM Electronically Signed by Faith Rogue M.D. on 03/08/2010 04:31:56 PM

## 2010-03-10 ENCOUNTER — Ambulatory Visit: Payer: Medicare Other | Admitting: Physical Therapy

## 2010-03-15 ENCOUNTER — Ambulatory Visit: Payer: Medicare Other | Admitting: Physical Therapy

## 2010-03-17 ENCOUNTER — Inpatient Hospital Stay (INDEPENDENT_AMBULATORY_CARE_PROVIDER_SITE_OTHER)
Admission: RE | Admit: 2010-03-17 | Discharge: 2010-03-17 | Disposition: A | Discharge status: Home / Self Care | Payer: Medicare Other | Source: Ambulatory Visit | Attending: Family Medicine | Admitting: Family Medicine

## 2010-03-17 ENCOUNTER — Ambulatory Visit: Payer: Self-pay | Admitting: Physical Therapy

## 2010-03-17 DIAGNOSIS — S0180XA Unspecified open wound of other part of head, initial encounter: Secondary | ICD-10-CM

## 2010-03-22 ENCOUNTER — Ambulatory Visit: Payer: Medicare Other | Admitting: Physical Therapy

## 2010-03-22 NOTE — Letter (Signed)
Summary: [facility]  [facility]   Imported By: Lennie Odor 03/14/2010 14:28:57  _____________________________________________________________________  External Attachment:    Type:   Image     Comment:   External Document

## 2010-03-24 ENCOUNTER — Ambulatory Visit: Payer: Medicare Other | Attending: Physical Medicine & Rehabilitation | Admitting: Physical Therapy

## 2010-03-24 DIAGNOSIS — IMO0001 Reserved for inherently not codable concepts without codable children: Secondary | ICD-10-CM | POA: Insufficient documentation

## 2010-03-24 DIAGNOSIS — R5381 Other malaise: Secondary | ICD-10-CM | POA: Insufficient documentation

## 2010-03-24 DIAGNOSIS — M6281 Muscle weakness (generalized): Secondary | ICD-10-CM | POA: Insufficient documentation

## 2010-03-24 DIAGNOSIS — R269 Unspecified abnormalities of gait and mobility: Secondary | ICD-10-CM | POA: Insufficient documentation

## 2010-03-29 ENCOUNTER — Ambulatory Visit: Payer: Medicare Other | Admitting: Physical Therapy

## 2010-03-31 ENCOUNTER — Encounter: Payer: Self-pay | Admitting: Licensed Clinical Social Worker

## 2010-03-31 ENCOUNTER — Ambulatory Visit: Payer: Medicare Other | Admitting: Physical Therapy

## 2010-03-31 DIAGNOSIS — F17200 Nicotine dependence, unspecified, uncomplicated: Secondary | ICD-10-CM | POA: Insufficient documentation

## 2010-03-31 DIAGNOSIS — R32 Unspecified urinary incontinence: Secondary | ICD-10-CM | POA: Insufficient documentation

## 2010-03-31 NOTE — Letter (Signed)
Summary: [facility]  [facility]   Imported By: Sherian Rein 03/22/2010 12:37:54  _____________________________________________________________________  External Attachment:    Type:   Image     Comment:   External Document

## 2010-04-01 ENCOUNTER — Ambulatory Visit: Payer: Self-pay | Admitting: Internal Medicine

## 2010-04-04 ENCOUNTER — Ambulatory Visit (INDEPENDENT_AMBULATORY_CARE_PROVIDER_SITE_OTHER): Payer: Medicare Other | Admitting: Internal Medicine

## 2010-04-04 ENCOUNTER — Encounter (HOSPITAL_BASED_OUTPATIENT_CLINIC_OR_DEPARTMENT_OTHER): Payer: Medicare Other | Admitting: Oncology

## 2010-04-04 ENCOUNTER — Encounter: Payer: Self-pay | Admitting: Internal Medicine

## 2010-04-04 ENCOUNTER — Other Ambulatory Visit: Payer: Medicare Other

## 2010-04-04 ENCOUNTER — Other Ambulatory Visit: Payer: Self-pay | Admitting: Internal Medicine

## 2010-04-04 ENCOUNTER — Other Ambulatory Visit: Payer: Self-pay | Admitting: Oncology

## 2010-04-04 DIAGNOSIS — E119 Type 2 diabetes mellitus without complications: Secondary | ICD-10-CM

## 2010-04-04 DIAGNOSIS — I2510 Atherosclerotic heart disease of native coronary artery without angina pectoris: Secondary | ICD-10-CM | POA: Insufficient documentation

## 2010-04-04 DIAGNOSIS — G4733 Obstructive sleep apnea (adult) (pediatric): Secondary | ICD-10-CM

## 2010-04-04 DIAGNOSIS — E785 Hyperlipidemia, unspecified: Secondary | ICD-10-CM | POA: Insufficient documentation

## 2010-04-04 DIAGNOSIS — I10 Essential (primary) hypertension: Secondary | ICD-10-CM | POA: Insufficient documentation

## 2010-04-04 DIAGNOSIS — I4891 Unspecified atrial fibrillation: Secondary | ICD-10-CM | POA: Insufficient documentation

## 2010-04-04 DIAGNOSIS — D72821 Monocytosis (symptomatic): Secondary | ICD-10-CM

## 2010-04-04 DIAGNOSIS — N401 Enlarged prostate with lower urinary tract symptoms: Secondary | ICD-10-CM

## 2010-04-04 DIAGNOSIS — Z8546 Personal history of malignant neoplasm of prostate: Secondary | ICD-10-CM

## 2010-04-04 LAB — CBC
Hemoglobin: 14.6 g/dL (ref 13.0–17.0)
MCH: 31.9 pg (ref 26.0–34.0)
MCHC: 35 g/dL (ref 30.0–36.0)

## 2010-04-04 LAB — DIFFERENTIAL
Basophils Absolute: 0 10*3/uL (ref 0.0–0.1)
Eosinophils Absolute: 0 10*3/uL (ref 0.0–0.7)
Lymphocytes Relative: 17 % (ref 12–46)
Neutrophils Relative %: 57 % (ref 43–77)

## 2010-04-04 LAB — BASIC METABOLIC PANEL
CO2: 26 mEq/L (ref 19–32)
Calcium: 9.3 mg/dL (ref 8.4–10.5)
Creatinine, Ser: 1.06 mg/dL (ref 0.4–1.5)
Glucose, Bld: 174 mg/dL — ABNORMAL HIGH (ref 70–99)
Sodium: 138 mEq/L (ref 135–145)

## 2010-04-04 LAB — CBC WITH DIFFERENTIAL/PLATELET
BASO%: 0.1 % (ref 0.0–2.0)
Basophils Absolute: 0 10*3/uL (ref 0.0–0.1)
Eosinophils Absolute: 0.2 10*3/uL (ref 0.0–0.5)
HCT: 37 % — ABNORMAL LOW (ref 38.4–49.9)
HGB: 12.4 g/dL — ABNORMAL LOW (ref 13.0–17.1)
LYMPH%: 9.1 % — ABNORMAL LOW (ref 14.0–49.0)
MCHC: 33.4 g/dL (ref 32.0–36.0)
MONO#: 3 10*3/uL — ABNORMAL HIGH (ref 0.1–0.9)
NEUT%: 50.7 % (ref 39.0–75.0)
Platelets: 101 10*3/uL — ABNORMAL LOW (ref 140–400)
WBC: 8 10*3/uL (ref 4.0–10.3)
lymph#: 0.7 10*3/uL — ABNORMAL LOW (ref 0.9–3.3)

## 2010-04-04 LAB — OVA AND PARASITE EXAMINATION

## 2010-04-04 LAB — STOOL CULTURE

## 2010-04-05 ENCOUNTER — Ambulatory Visit: Payer: Medicare Other | Admitting: Physical Therapy

## 2010-04-05 LAB — POCT I-STAT 3, VENOUS BLOOD GAS (G3P V)
Bicarbonate: 24.9 mEq/L — ABNORMAL HIGH (ref 20.0–24.0)
TCO2: 26 mmol/L (ref 0–100)
pCO2, Ven: 47.7 mmHg (ref 45.0–50.0)
pH, Ven: 7.325 — ABNORMAL HIGH (ref 7.250–7.300)

## 2010-04-05 LAB — GLUCOSE, CAPILLARY
Glucose-Capillary: 113 mg/dL — ABNORMAL HIGH (ref 70–99)
Glucose-Capillary: 96 mg/dL (ref 70–99)
Glucose-Capillary: 99 mg/dL (ref 70–99)

## 2010-04-05 LAB — CBC
MCV: 90.4 fL (ref 78.0–100.0)
Platelets: 79 10*3/uL — ABNORMAL LOW (ref 150–400)
RBC: 4.27 MIL/uL (ref 4.22–5.81)
WBC: 6.6 10*3/uL (ref 4.0–10.5)

## 2010-04-05 LAB — BASIC METABOLIC PANEL
Chloride: 107 mEq/L (ref 96–112)
Creatinine, Ser: 0.85 mg/dL (ref 0.4–1.5)
GFR calc Af Amer: >60 mL/min (ref >60)
Potassium: 4 mEq/L (ref 3.5–5.1)

## 2010-04-05 LAB — POCT I-STAT 3, ART BLOOD GAS (G3+): pH, Arterial: 7.383 (ref 7.350–7.450)

## 2010-04-06 LAB — GLUCOSE, CAPILLARY
Glucose-Capillary: 100 mg/dL — ABNORMAL HIGH (ref 70–99)
Glucose-Capillary: 101 mg/dL — ABNORMAL HIGH (ref 70–99)
Glucose-Capillary: 102 mg/dL — ABNORMAL HIGH (ref 70–99)
Glucose-Capillary: 102 mg/dL — ABNORMAL HIGH (ref 70–99)
Glucose-Capillary: 103 mg/dL — ABNORMAL HIGH (ref 70–99)
Glucose-Capillary: 106 mg/dL — ABNORMAL HIGH (ref 70–99)
Glucose-Capillary: 109 mg/dL — ABNORMAL HIGH (ref 70–99)
Glucose-Capillary: 110 mg/dL — ABNORMAL HIGH (ref 70–99)
Glucose-Capillary: 120 mg/dL — ABNORMAL HIGH (ref 70–99)
Glucose-Capillary: 120 mg/dL — ABNORMAL HIGH (ref 70–99)
Glucose-Capillary: 120 mg/dL — ABNORMAL HIGH (ref 70–99)
Glucose-Capillary: 132 mg/dL — ABNORMAL HIGH (ref 70–99)
Glucose-Capillary: 132 mg/dL — ABNORMAL HIGH (ref 70–99)
Glucose-Capillary: 134 mg/dL — ABNORMAL HIGH (ref 70–99)
Glucose-Capillary: 138 mg/dL — ABNORMAL HIGH (ref 70–99)
Glucose-Capillary: 69 mg/dL — ABNORMAL LOW (ref 70–99)
Glucose-Capillary: 89 mg/dL (ref 70–99)
Glucose-Capillary: 91 mg/dL (ref 70–99)
Glucose-Capillary: 96 mg/dL (ref 70–99)
Glucose-Capillary: 97 mg/dL (ref 70–99)
Glucose-Capillary: 98 mg/dL (ref 70–99)
Glucose-Capillary: 98 mg/dL (ref 70–99)
Glucose-Capillary: 98 mg/dL (ref 70–99)
Glucose-Capillary: 98 mg/dL (ref 70–99)

## 2010-04-06 LAB — LACTIC ACID, PLASMA: Lactic Acid, Venous: 1.7 mmol/L (ref 0.5–2.2)

## 2010-04-06 LAB — DIFFERENTIAL
Basophils Absolute: 0.1 10*3/uL (ref 0.0–0.1)
Basophils Relative: 0 % (ref 0–1)
Basophils Relative: 0 % (ref 0–1)
Eosinophils Absolute: 0 10*3/uL (ref 0.0–0.7)
Eosinophils Absolute: 0 10*3/uL (ref 0.0–0.7)
Eosinophils Absolute: 0.1 10*3/uL (ref 0.0–0.7)
Eosinophils Relative: 0 % (ref 0–5)
Eosinophils Relative: 0 % (ref 0–5)
Lymphocytes Relative: 11 % — ABNORMAL LOW (ref 12–46)
Lymphocytes Relative: 2 % — ABNORMAL LOW (ref 12–46)
Lymphs Abs: 0.2 10*3/uL — ABNORMAL LOW (ref 0.7–4.0)
Monocytes Absolute: 5.9 10*3/uL — ABNORMAL HIGH (ref 0.1–1.0)
Monocytes Absolute: 6.9 10*3/uL — ABNORMAL HIGH (ref 0.1–1.0)
Monocytes Relative: 19 % — ABNORMAL HIGH (ref 3–12)
Neutro Abs: 13.6 10*3/uL — ABNORMAL HIGH (ref 1.7–7.7)
Neutro Abs: 17.6 10*3/uL — ABNORMAL HIGH (ref 1.7–7.7)

## 2010-04-06 LAB — CULTURE, BLOOD (ROUTINE X 2)
Culture  Setup Time: 201110170921
Culture  Setup Time: 201110170921
Culture  Setup Time: 201111170533
Culture  Setup Time: 201111170534
Culture: NO GROWTH
Culture: NO GROWTH
Culture: NO GROWTH

## 2010-04-06 LAB — CBC
HCT: 33.5 % — ABNORMAL LOW (ref 39.0–52.0)
HCT: 33.7 % — ABNORMAL LOW (ref 39.0–52.0)
HCT: 35.3 % — ABNORMAL LOW (ref 39.0–52.0)
HCT: 38.3 % — ABNORMAL LOW (ref 39.0–52.0)
HCT: 40.4 % (ref 39.0–52.0)
Hemoglobin: 11.8 g/dL — ABNORMAL LOW (ref 13.0–17.0)
Hemoglobin: 11.9 g/dL — ABNORMAL LOW (ref 13.0–17.0)
Hemoglobin: 13.1 g/dL (ref 13.0–17.0)
Hemoglobin: 14.3 g/dL (ref 13.0–17.0)
MCH: 32.3 pg (ref 26.0–34.0)
MCH: 32.6 pg (ref 26.0–34.0)
MCH: 32.6 pg (ref 26.0–34.0)
MCH: 33 pg (ref 26.0–34.0)
MCH: 33 pg (ref 26.0–34.0)
MCHC: 34 g/dL (ref 30.0–36.0)
MCHC: 34.5 g/dL (ref 30.0–36.0)
MCHC: 34.6 g/dL (ref 30.0–36.0)
MCHC: 34.9 g/dL (ref 30.0–36.0)
MCHC: 34.9 g/dL (ref 30.0–36.0)
MCHC: 35 g/dL (ref 30.0–36.0)
MCHC: 35.1 g/dL (ref 30.0–36.0)
MCHC: 35.3 g/dL (ref 30.0–36.0)
MCHC: 35.5 g/dL (ref 30.0–36.0)
MCV: 92.9 fL (ref 78.0–100.0)
MCV: 93.1 fL (ref 78.0–100.0)
MCV: 93.5 fL (ref 78.0–100.0)
MCV: 93.8 fL (ref 78.0–100.0)
MCV: 93.8 fL (ref 78.0–100.0)
Platelets: 146 10*3/uL — ABNORMAL LOW (ref 150–400)
Platelets: 66 10*3/uL — ABNORMAL LOW (ref 150–400)
Platelets: 67 10*3/uL — ABNORMAL LOW (ref 150–400)
Platelets: 72 10*3/uL — ABNORMAL LOW (ref 150–400)
Platelets: 72 10*3/uL — ABNORMAL LOW (ref 150–400)
Platelets: 77 10*3/uL — ABNORMAL LOW (ref 150–400)
Platelets: 81 10*3/uL — ABNORMAL LOW (ref 150–400)
Platelets: 81 10*3/uL — ABNORMAL LOW (ref 150–400)
RBC: 4.33 MIL/uL (ref 4.22–5.81)
RDW: 13.6 % (ref 11.5–15.5)
RDW: 14.1 % (ref 11.5–15.5)
RDW: 14.1 % (ref 11.5–15.5)
RDW: 14.2 % (ref 11.5–15.5)
RDW: 14.2 % (ref 11.5–15.5)
RDW: 14.3 % (ref 11.5–15.5)
RDW: 14.3 % (ref 11.5–15.5)
RDW: 14.3 % (ref 11.5–15.5)
RDW: 14.5 % (ref 11.5–15.5)
RDW: 14.6 % (ref 11.5–15.5)
WBC: 20.9 10*3/uL — ABNORMAL HIGH (ref 4.0–10.5)
WBC: 23.7 10*3/uL — ABNORMAL HIGH (ref 4.0–10.5)
WBC: 33 10*3/uL — ABNORMAL HIGH (ref 4.0–10.5)
WBC: 7.9 10*3/uL (ref 4.0–10.5)

## 2010-04-06 LAB — COMPREHENSIVE METABOLIC PANEL
ALT: 46 U/L (ref 0–53)
ALT: 55 U/L — ABNORMAL HIGH (ref 0–53)
ALT: 55 U/L — ABNORMAL HIGH (ref 0–53)
AST: 37 U/L (ref 0–37)
AST: 40 U/L — ABNORMAL HIGH (ref 0–37)
AST: 52 U/L — ABNORMAL HIGH (ref 0–37)
AST: 56 U/L — ABNORMAL HIGH (ref 0–37)
Albumin: 3.5 g/dL (ref 3.5–5.2)
Albumin: 3.5 g/dL (ref 3.5–5.2)
Albumin: 3.8 g/dL (ref 3.5–5.2)
Albumin: 3.8 g/dL (ref 3.5–5.2)
Albumin: 4.3 g/dL (ref 3.5–5.2)
Albumin: 4.4 g/dL (ref 3.5–5.2)
Alkaline Phosphatase: 67 U/L (ref 39–117)
Alkaline Phosphatase: 67 U/L (ref 39–117)
Alkaline Phosphatase: 80 U/L (ref 39–117)
BUN: 15 mg/dL (ref 6–23)
BUN: 21 mg/dL (ref 6–23)
BUN: 24 mg/dL — ABNORMAL HIGH (ref 6–23)
CO2: 24 mEq/L (ref 19–32)
CO2: 24 mEq/L (ref 19–32)
Calcium: 8.6 mg/dL (ref 8.4–10.5)
Calcium: 8.8 mg/dL (ref 8.4–10.5)
Calcium: 8.9 mg/dL (ref 8.4–10.5)
Calcium: 9.1 mg/dL (ref 8.4–10.5)
Calcium: 9.5 mg/dL (ref 8.4–10.5)
Chloride: 104 mEq/L (ref 96–112)
Creatinine, Ser: 1.17 mg/dL (ref 0.4–1.5)
Creatinine, Ser: 1.36 mg/dL (ref 0.4–1.5)
GFR calc Af Amer: >60 mL/min (ref >60)
GFR calc Af Amer: >60 mL/min (ref >60)
GFR calc non Af Amer: 50 mL/min — ABNORMAL LOW (ref >60)
Glucose, Bld: 103 mg/dL — ABNORMAL HIGH (ref 70–99)
Potassium: 3.7 mEq/L (ref 3.5–5.1)
Potassium: 4.1 mEq/L (ref 3.5–5.1)
Potassium: 4.7 mEq/L (ref 3.5–5.1)
Sodium: 137 mEq/L (ref 135–145)
Sodium: 140 mEq/L (ref 135–145)
Sodium: 140 mEq/L (ref 135–145)
Sodium: 141 mEq/L (ref 135–145)
Sodium: 142 mEq/L (ref 135–145)
Total Bilirubin: 1 mg/dL (ref 0.3–1.2)
Total Bilirubin: 1.1 mg/dL (ref 0.3–1.2)
Total Protein: 5.9 g/dL — ABNORMAL LOW (ref 6.0–8.3)
Total Protein: 6 g/dL (ref 6.0–8.3)
Total Protein: 6.3 g/dL (ref 6.0–8.3)
Total Protein: 6.3 g/dL (ref 6.0–8.3)
Total Protein: 6.6 g/dL (ref 6.0–8.3)
Total Protein: 6.7 g/dL (ref 6.0–8.3)

## 2010-04-06 LAB — CHROMOSOME ANALYSIS, BONE MARROW

## 2010-04-06 LAB — URINALYSIS, ROUTINE W REFLEX MICROSCOPIC
Bilirubin Urine: NEGATIVE
Glucose, UA: NEGATIVE mg/dL
Ketones, ur: NEGATIVE mg/dL
Ketones, ur: NEGATIVE mg/dL
Leukocytes, UA: NEGATIVE
Leukocytes, UA: NEGATIVE
Nitrite: NEGATIVE
Nitrite: NEGATIVE
Protein, ur: NEGATIVE mg/dL
Protein, ur: NEGATIVE mg/dL
Urobilinogen, UA: 0.2 mg/dL (ref 0.0–1.0)
pH: 5.5 (ref 5.0–8.0)

## 2010-04-06 LAB — BASIC METABOLIC PANEL
BUN: 19 mg/dL (ref 6–23)
BUN: 25 mg/dL — ABNORMAL HIGH (ref 6–23)
CO2: 22 mEq/L (ref 19–32)
Chloride: 110 mEq/L (ref 96–112)
Creatinine, Ser: 1.37 mg/dL (ref 0.4–1.5)
GFR calc non Af Amer: 50 mL/min — ABNORMAL LOW (ref >60)
Glucose, Bld: 106 mg/dL — ABNORMAL HIGH (ref 70–99)
Glucose, Bld: 113 mg/dL — ABNORMAL HIGH (ref 70–99)
Potassium: 3.5 mEq/L (ref 3.5–5.1)
Potassium: 4.4 mEq/L (ref 3.5–5.1)
Sodium: 138 mEq/L (ref 135–145)

## 2010-04-06 LAB — PROTIME-INR
INR: 1.23 (ref 0.00–1.49)
Prothrombin Time: 15.7 seconds — ABNORMAL HIGH (ref 11.6–15.2)

## 2010-04-06 LAB — URINE CULTURE
Colony Count: NO GROWTH
Culture  Setup Time: 201111180509
Culture: NO GROWTH

## 2010-04-06 LAB — HEPATITIS PANEL, ACUTE
HCV Ab: NEGATIVE
Hep A IgM: NEGATIVE
Hepatitis B Surface Ag: NEGATIVE

## 2010-04-06 LAB — CLOSTRIDIUM DIFFICILE BY PCR

## 2010-04-06 LAB — MRSA PCR SCREENING: MRSA by PCR: NEGATIVE

## 2010-04-06 LAB — PROCALCITONIN: Procalcitonin: <0.1 ng/mL

## 2010-04-06 LAB — TSH: TSH: 0.73 u[IU]/mL (ref 0.350–4.500)

## 2010-04-07 ENCOUNTER — Other Ambulatory Visit: Payer: Self-pay | Admitting: Cardiothoracic Surgery

## 2010-04-07 ENCOUNTER — Ambulatory Visit (INDEPENDENT_AMBULATORY_CARE_PROVIDER_SITE_OTHER): Payer: Self-pay | Admitting: Cardiothoracic Surgery

## 2010-04-07 ENCOUNTER — Ambulatory Visit
Admission: RE | Admit: 2010-04-07 | Discharge: 2010-04-07 | Disposition: A | Discharge status: Home / Self Care | Payer: Medicare Other | Source: Ambulatory Visit | Attending: Cardiothoracic Surgery | Admitting: Cardiothoracic Surgery

## 2010-04-07 ENCOUNTER — Ambulatory Visit: Payer: Medicare Other | Admitting: Physical Therapy

## 2010-04-07 ENCOUNTER — Ambulatory Visit: Payer: Self-pay

## 2010-04-07 DIAGNOSIS — I359 Nonrheumatic aortic valve disorder, unspecified: Secondary | ICD-10-CM

## 2010-04-07 DIAGNOSIS — I2510 Atherosclerotic heart disease of native coronary artery without angina pectoris: Secondary | ICD-10-CM

## 2010-04-08 ENCOUNTER — Telehealth: Payer: Self-pay | Admitting: Internal Medicine

## 2010-04-08 NOTE — Assessment & Plan Note (Addendum)
OFFICE VISIT  [name], [name] DOB:  Jun 15, 1927                                        April 07, 2010 CHART #:  [id number]  Mr. Dayal returns to the office in followup after his aortic valve replacement with pericardial tissue valve and coronary artery bypass grafting x3 on January 28, 2010.  Considering his numerous preoperative medical problems including several admissions to [facility] in the fall of the year with severe C. diff colitis, he has been making good progress postoperatively.  He is increasing his physical activity appropriately, he walks daily, he is still involved in outpatient rehab, and we will start cardiac rehab program next in 2 weeks.  He has had no overt symptoms of congestive heart failure.  No syncope.  On exam, his blood pressure is 129/66, pulse 65, respiratory rate 16, and O2 sats 97%.  His sternum is stable and well healed.  His lungs are clear.  Bowel sounds are crisp without any murmur of aortic insufficiency.  The right thigh and the vein harvest site appears healing well.  The patient in the lower right calf had one small slightly reddened area, but did not appear to be associated with vein harvesting site.  His wife notes that over the years he has had numerous small areas crop up and he has frequently had skin biopsies with skin incision by Dr. Yetta Barre, Dermatology.  Followup chest x-ray shows clear lung fields bilaterally.  The patient continues on Nu-Iron, allopurinol, aspirin, Lexapro, Lasix, multivitamins, omeprazole, vitamin 6, vitamin B, vitamin C, vitamin D, calcium, and K-Dur.  Because of the patient's multiple episodes of C. diff colitis, brought on by antibiotics, elected to at this point watch the area in the right lower leg, treated with Neosporin ointment topically rather than starting the patient on p.o. antibiotics.  If it does not improve rapidly he will see his dermatologist and then consult with  Dr. Orvan Falconer, about appropriate antibiotic use for the situation.  Overall he is seems to be making good progress especially considering his longstanding preoperative illness.  He has appointment with Dr. Allyson Sabal in several weeks.  I will plan to see him back in 3 months.  Sheliah Plane, MD Electronically Signed  EG/MEDQ  D:  04/07/2010  T:  04/08/2010  Job:  [id number]  cc:   Nanetta Batty, M.D. Dewayne Shorter, MD [place]. Yetta Barre, M.D.

## 2010-04-09 LAB — GLUCOSE, CAPILLARY
Glucose-Capillary: 125 mg/dL — ABNORMAL HIGH (ref 70–99)
Glucose-Capillary: 98 mg/dL (ref 70–99)

## 2010-04-09 LAB — SURGICAL PCR SCREEN: MRSA, PCR: NEGATIVE

## 2010-04-11 ENCOUNTER — Telehealth: Payer: Self-pay | Admitting: Internal Medicine

## 2010-04-12 ENCOUNTER — Ambulatory Visit: Payer: Medicare Other | Admitting: Physical Therapy

## 2010-04-12 NOTE — Letter (Signed)
Summary: Medications & Medical Hx/Patient  Medications & Medical Hx/Patient   Imported By: Sherian Rein 04/07/2010 07:57:45  _____________________________________________________________________  External Attachment:    Type:   Image     Comment:   External Document

## 2010-04-12 NOTE — Assessment & Plan Note (Signed)
Summary: new to establish/medicare/aarp/#,cd---RS'D FROM BUMP LIST/NWS   Vital Signs:  Patient profile:   77 year old male Height:      70 inches (177.80 cm) Weight:      208.0 pounds (94.55 kg) O2 Sat:      97 % on Room air Temp:     98.6 degrees F (37.00 degrees C) oral Pulse rate:   68 / minute BP sitting:   130 / 60  (left arm) Cuff size:   large  Vitals Entered By: Orlan Leavens RMA (April 04, 2010 2:36 PM)  O2 Flow:  Room air CC: New patient Is Patient Diabetic? No Pain Assessment Patient in pain? no        Primary Care Provider:  Newt Lukes MD  CC:  New patient.  History of Present Illness: new pt to me and our division, here to est care - prev at [facility]  extensive review of recent medical illness/hosp/surg and rehab efforts this year last dc summary reviewed: 1. DM type 2 with diabetic neuropathy. 2. Optic nerve infarct with decreased vision. 3. History of severe spinal stenosis L4-L5 with HNP involving L3 nerve root. 4. Chronic thrombocytopenia. 5. Lap coli in July 2012. 6. Strep bacteremia in October 2011. 7. Severe C. diff colitis in November 2011, relapse 01/2010. 8. Depression. 9. Restless legs syndrome. 10.Gout. 11.Prostate cancer with prostatectomy. 12.Obstructive sleep apnea with use of CPAP. 13.Left total hip replacement in 2006. 14.Left inguinal hernia repair. 15.History of horseshoe kidney. 16. coronary artery disease -CABG x 3; 01/28/2010 17. hx critical aortic stenosis s/p AoVR 01/2010 18. hx A flutter and rapid rate AFib -intol amiodarone, changed over to p.o. Cardizem.  He is a high fall risk and not a Coumadin candidate.  DM2 - off all meds since 01/2010 - prev on starlix for years - a1c 5.6 preop 01/2010 - does not check cbg at home - no signs or symptoms hyper/hypoglycemia  OSA - needs new supplier of mask since former supplier no longer medicare approved - last sleep study >64yr ago in [place]- does not follow with sleep specialist  regularly  prostate ca- s/p rescetion - follows with uro but missed apt due to acute illness/hosp/rehab - requests psa to send to uro ([facility]) - no voiding issues or change  depression - exac by illness and hosp - reports compliance with ongoing medical treatment and no changes in medication dose or frequency. denies adverse side effects related to current therapy.   Preventive Screening-Counseling & Management  Alcohol-Tobacco     Alcohol drinks/day: <1     Alcohol Counseling: not indicated; use of alcohol is not excessive or problematic     Smoking Status: quit     Tobacco Counseling: to remain off tobacco products  Caffeine-Diet-Exercise     Does Patient Exercise: yes     Exercise Counseling: to improve exercise regimen  Safety-Violence-Falls     Seat Belt Counseling: not indicated; patient wears seat belts     Helmet Counseling: not indicated; patient wears helmet when riding bicycle/motocycle     Firearm Counseling: not applicable     Violence Counseling: not indicated; no violence risk noted     Fall Risk Counseling: counseling provided; falls with injury noted  Clinical Review Panels:  Prevention   Last Colonoscopy:  Location:  [facility].  (05/07/2008)  Immunizations   Last Tetanus Booster:  Historical (01/23/2010)   Last Flu Vaccine:  Historical (10/23/2009)   Last Pneumovax:  Historical (01/24/2008)  Diabetes Management   Last Dilated Eye Exam:  normal (01/23/2009)   Last Flu Vaccine:  Historical (10/23/2009)   Last Pneumovax:  Historical (01/24/2008)   Current Medications (verified): 1)  Allopurinol 300 Mg Tabs (Allopurinol) .... Take 1 Tablet By Mouth Once A Day in The Evening 2)  Ropinirole Hcl 0.25 Mg Tabs (Ropinirole Hcl) .... Take 3 Tabs By Mouth At Bedtime 3)  Lexapro 20 Mg Tabs (Escitalopram Oxalate) .... Take 1 Tablet By Mouth Once A Day 4)  Omeprazole 20 Mg Tbec (Omeprazole) .... Take 1 Tablet By Mouth Once A Day in The Morning 5)  Stool  Softener 100 Mg Caps (Docusate Sodium) .... As Needed 6)  Vitamin B-6 100 Mg Tabs (Pyridoxine Hcl) .... Take 1 Tablet By Mouth Once A Day 7)  Icaps  Caps (Multiple Vitamins-Minerals) .... Take 1 Tablet By Mouth Once A Day 8)  Vitamin C 500 Mg Tabs (Ascorbic Acid) .... Take 1 Tablet By Mouth Once A Day 9)  Vitamin D 1000 Unit Tabs (Cholecalciferol) .... Take 1 Tablet By Mouth Once A Day 10)  Multivitamins  Tabs (Multiple Vitamin) .... Take 1 Tablet By Mouth Once A Day 11)  Calcium 500 Mg Tabs (Calcium Carbonate) .... Take 1 Tablet By Mouth Once A Day 12)  Crestor 20 Mg Tabs (Rosuvastatin Calcium) .... Take 1 By Mouth Once Daily 13)  Furosemide 20 Mg Tabs (Furosemide) .... Take 1/2 By Mouth Every Morning 14)  Ferrex 150 150 Mg Caps (Polysaccharide Iron Complex) .... Take 1 By Mouth Once Daily 15)  Metoprolol Tartrate 25 Mg Tabs (Metoprolol Tartrate) .... Take 2 By Mouth Once Daily 16)  Aspirin 325 Mg Tabs (Aspirin) .... Take 1 By Mouth Once Daily 17)  Clonazepam 0.5 Mg Tabs (Clonazepam) .... Take 1/2 At Bedtime As Needed 18)  Diltiazem Hcl Coated Beads 180 Mg Xr24h-Cap (Diltiazem Hcl Coated Beads) .... Take 1 Every Morning  Allergies (verified): 1)  Morphine  Past History:  Past Medical History: Adenomatous Colon Polyps 03/1993 Hyperlipidemia Hypertension Arthritis Prostate Cancer s/p prostectomy 1993 Depression Sleep Apnea CPAP Anal fissure Hx Blood Clots: right eye 1987 - legally blind Hearing Loss Gout Diabetes mellitus, type II CAD s/p CABG x 3 (01/2010) AS s/p AVR (01/2010) Atrial fibrillation - not anticoag candidate due to decond and falls  MD roster: card - [facility] - [facility] - start optho - [facility] derm - dan jones ID - [facility] - [facility] pul[facility] - [facility] - [facility] - [facility] ([facility])  Past Surgical History: Hernia Surgery, left 03/15/09 Anal sphincterotomy 1996 Prostatectomy 1993 Hip Replacement-Left 2006 Cataract extraction left 4/11 & right  5/11 Cholecystectomy 2011 Coronary artery bypass graft (01/28/2010) Aortic Valve Replacement:  (01/28/2010)  Family History: Family History of Heart Disease: Mother,  Brothers-HTN No FH of Colon Cancer:  Family History of Alcoholism/Addiction (father) Family History of Sudden Death (father)  Per patient records Mother died of lung cancer in 82 Father died of brain hemorrage 1956 1 brother died in 57 of MI @ 75 Family History of Arthritis & gout  (brother)  Social History: Married, lives with spouse - 2 kids: 1 boy, 1 girl Retired Environmental manager Patient prev smoked Cigar Alcohol Use - no Daily Caffeine Use 2 cup/day Illicit Drug Use - no Smoking Status:  quit Does Patient Exercise:  yes  Review of Systems       see HPI above. I have reviewed all other systems and they were negative.   Physical Exam  General:  alert, well-developed, well-nourished, and cooperative to examination.   deconditioned - wife at side Head:  Normocephalic and atraumatic without obvious abnormalities. No apparent alopecia or balding. Eyes:  gross vision intact - no conjunctivitis Ears:  R ear normal and L ear normal.   Mouth:  teeth and gums in good repair; mucous membranes moist, without lesions or ulcers. oropharynx clear without exudate, no erythema.  Neck:  supple, full ROM, no masses, no thyromegaly; no thyroid nodules or tenderness. no JVD or carotid bruits.   Lungs:  normal respiratory effort, no intercostal retractions or use of accessory muscles; normal breath sounds bilaterally - no crackles and no wheezes.    Heart:  normal rate, regular rhythm, and grade  2/6 systolic murmur is unchanged.   Abdomen:  soft, non-tender, normal bowel sounds, no distention; no masses and no appreciable hepatomegaly or splenomegaly.   Rectal:  defer uro Genitalia:  defer uro Prostate:  defer uro Msk:  No deformity or scoliosis noted of thoracic or lumbar spine.   Neurologic:  alert & oriented X3 and  cranial nerves II-XII symetrically intact.  strength normal in all extremities, sensation intact to light touch, and gait normal. speech fluent without dysarthria or aphasia; follows commands with good comprehension.  Skin:  no rashes, vesicles, ulcers, or erythema. No nodules or irregularity to palpation.  Psych:  reserved but oriented X3, memory intact for recent and remote, normally interactive, good eye contact, not anxious appearing, not depressed appearing, and not agitated.      Impression & Recommendations:  Problem # 1:  DIABETES MELLITUS, TYPE II (ICD-250.00)  ?diet controlled - off OHA since 01/2010 - last a1c 5.6 01/26/10 in hosp emr recheck now to advise on tx same - no med change rec at this time His updated medication list for this problem includes:    Aspirin 325 Mg Tabs (Aspirin) .Marland Kitchen... Take 1 by mouth once daily  Orders: TLB-A1C / Hgb A1C (Glycohemoglobin) (83036-A1C)  Last Eye Exam: normal (01/23/2009)  Problem # 2:  HYPERTROPHY PROSTATE W/UR OBST & OTH LUTS (ICD-600.01) hx prostate cancer - follows reg with uro but appt missed due to hosp and illness/rehab - check psa now and fax to uro ([facility]) in [place] - f/u there if abn wife/pt understand same Orders: TLB-PSA (Prostate Specific Antigen) (84153-PSA)  Problem # 3:  OBSTRUCTIVE SLEEP APNEA (ICD-327.23) remote sleep study - now in need of new supplier for home equipment will refer to sleep specialist to review, esp in setting of other med/hx changes since last eval for OSA Orders: Sleep Disorder Referral (Sleep Disorder)  Problem # 4:  CORONARY ARTERY DISEASE (ICD-414.00) CABG x 3 01/2010 - no active anginal symptoms - ongoing rehab efforts - followup cards and cts on same ([facility], [facility]) no med change rec hosp/op reviewed today and post op complications His updated medication list for this problem includes:    Furosemide 20 Mg Tabs (Furosemide) .Marland Kitchen... Take 1/2 by mouth every morning    Metoprolol Tartrate 25 Mg Tabs  (Metoprolol tartrate) .Marland Kitchen... 1 by mouth two times a day    Aspirin 325 Mg Tabs (Aspirin) .Marland Kitchen... Take 1 by mouth once daily    Diltiazem Hcl Coated Beads 180 Mg Xr24h-cap (Diltiazem hcl coated beads) .Marland Kitchen... Take 1 every morning  Problem # 5:  HYPERTENSION (ICD-401.9)  His updated medication list for this problem includes:    Furosemide 20 Mg Tabs (Furosemide) .Marland Kitchen... Take 1/2 by mouth every morning    Metoprolol Tartrate 25 Mg Tabs (  Metoprolol tartrate) .Marland Kitchen... 1 by mouth two times a day    Diltiazem Hcl Coated Beads 180 Mg Xr24h-cap (Diltiazem hcl coated beads) .Marland Kitchen... Take 1 every morning  BP today: 130/60 Prior BP: 177/80 (12/30/2009)  Problem # 6:  HYPERLIPIDEMIA (ICD-272.4)  His updated medication list for this problem includes:    Crestor 20 Mg Tabs (Rosuvastatin calcium) .Marland Kitchen... Take 1 by mouth once daily Time spent with patient and wife 45 minutes, more than 50% of this time was spent reviewing and counseling patient on hosp, rehab, current meds and need for f/u with specialists as planned  Complete Medication List: 1)  Allopurinol 300 Mg Tabs (Allopurinol) .... Take 1 tablet by mouth once a day in the evening 2)  Ropinirole Hcl 0.25 Mg Tabs (Ropinirole hcl) .... Take 3 tabs by mouth at bedtime 3)  Lexapro 20 Mg Tabs (Escitalopram oxalate) .... Take 1 tablet by mouth once a day 4)  Omeprazole 20 Mg Tbec (Omeprazole) .... Take 1 tablet by mouth once a day in the morning 5)  Stool Softener 100 Mg Caps (Docusate sodium) .... As needed 6)  Vitamin B-6 100 Mg Tabs (Pyridoxine hcl) .... Take 1 tablet by mouth once a day 7)  Icaps Caps (Multiple vitamins-minerals) .... Take 1 tablet by mouth once a day 8)  Vitamin C 500 Mg Tabs (Ascorbic acid) .... Take 1 tablet by mouth once a day 9)  Vitamin D 1000 Unit Tabs (Cholecalciferol) .... Take 1 tablet by mouth two times a day 10)  Multivitamins Tabs (Multiple vitamin) .... Take 1 tablet by mouth once a day 11)  Calcium 500 Mg Tabs (Calcium carbonate)  .... Take 1 tablet by mouth once a day 12)  Crestor 20 Mg Tabs (Rosuvastatin calcium) .... Take 1 by mouth once daily 13)  Furosemide 20 Mg Tabs (Furosemide) .... Take 1/2 by mouth every morning 14)  Ferrex 150 150 Mg Caps (Polysaccharide iron complex) .... Take 1 by mouth once daily 15)  Metoprolol Tartrate 25 Mg Tabs (Metoprolol tartrate) .Marland Kitchen.. 1 by mouth two times a day 16)  Aspirin 325 Mg Tabs (Aspirin) .... Take 1 by mouth once daily 17)  Clonazepam 0.5 Mg Tabs (Clonazepam) .... Take 1/2 at bedtime as needed 18)  Diltiazem Hcl Coated Beads 180 Mg Xr24h-cap (Diltiazem hcl coated beads) .... Take 1 every morning 19)  Klor-con 10 10 Meq Cr-tabs (Potassium chloride) .Marland Kitchen.. 1 by mouth once daily  Patient Instructions: 1)  it was good to see you today.  2)  medications and interval history reviewed - no medication changes 3)  test(s) ordered today - your results will be called to you after review in 48-72 hours from the time of test completion - will fax copy of PSA to dr. Earlene Plater urology as requested 4)  will work on CPAP supply issues - we may refer to sleep specialist to help with same . Our office will contact you regarding this appointment once made.  5)  Please schedule a follow-up appointment in 3 months to review diabetes status and other medical issues, call sooner if problems.    Orders Added: 1)  TLB-PSA (Prostate Specific Antigen) [84153-PSA] 2)  TLB-A1C / Hgb A1C (Glycohemoglobin) [83036-A1C] 3)  Sleep Disorder Referral [Sleep Disorder] 4)  New Patient Level IV [81191]

## 2010-04-12 NOTE — Progress Notes (Addendum)
Summary: sore on leg  Phone Note Call from Patient   Caller: Spouse  [place]   Summary of Call: Pt has sore on leg. He was seen by Dr Tyrone Sage who has made referral to Dermatology. He mentioned he may be concerned about having another infection in his blood.  Pt wonders if he should come to our office for labs prior to Derm. Appt.  Pt was advised to keep Derm. appt. they are able to draw labs and will contact our office for appt. if needed.  Tomasita Morrow RN  April 08, 2010 10:35 AM  Initial call taken by: Tomasita Morrow RN,  April 08, 2010 10:35 AM

## 2010-04-13 ENCOUNTER — Telehealth: Payer: Self-pay | Admitting: *Deleted

## 2010-04-13 ENCOUNTER — Telehealth: Payer: Self-pay

## 2010-04-13 NOTE — Telephone Encounter (Signed)
States he has the Cdiff again. 3 liquid stools so far today. Has not tried immodium. Told her she could get & give him that. Asked her to make an appt for him to be seen. We do not have anything this week so she should see if pcp will see him. To md here to see if he can order without a visit or if labs are needed.Faustino Congress

## 2010-04-13 NOTE — Telephone Encounter (Signed)
States she cannot get an appt here until 4/10. Told her she needed to get him in with pcp asap. This cannot wait until 4/10. Stated she will do this. Told her i will call her when I hear from md here.Faustino Congress

## 2010-04-13 NOTE — Telephone Encounter (Signed)
Pt's spouse called stating that pt has CDiff and he has tried to get an appointment with Dr Orvan Falconer but was advised that there were no available appointment and to contact PCP. Spouse called to schedule an appointment with [facility] and was told that there are no appointment until Monday 03/26. Spouse is not satisfied about this and is requesting a work in appoint per scheduler that transferred her to triage, please advise.

## 2010-04-14 ENCOUNTER — Ambulatory Visit: Payer: Medicare Other | Admitting: Physical Therapy

## 2010-04-14 NOTE — Telephone Encounter (Signed)
Would resume flagyl until able to be seen for OV (will send erx if pt/family agree) - if symptoms worse or signs of dehydration, should go to ER - thanks

## 2010-04-15 NOTE — Telephone Encounter (Signed)
Pt's spouse states that pt has Rx for Flagyl and will start ASAP. Pt has a appt with Derm today and does not believe an appt is necessary at this time but will call back to schedule if needed.

## 2010-04-19 ENCOUNTER — Telehealth: Payer: Self-pay | Admitting: *Deleted

## 2010-04-19 ENCOUNTER — Ambulatory Visit: Payer: Medicare Other | Admitting: Physical Therapy

## 2010-04-21 ENCOUNTER — Ambulatory Visit: Payer: Medicare Other | Admitting: Physical Therapy

## 2010-04-21 NOTE — Progress Notes (Signed)
  Phone Note Outgoing Call   Call placed by: Golden Circle RN,  April 11, 2010 12:02 PM Details for Reason: wife called  Summary of Call: she left message that she spoke with md over the weekend & did not know if he needed to be seen wed or if she should just call in a report. I called her back & LM asking her to call me Initial call taken by: Golden Circle RN,  April 11, 2010 12:02 PM  Follow-up for Phone Call        spoke with wife. states she spoke with Dr. Orvan Falconer over the weekend for a place that broke out on his leg. saw a derm, Donzetta Starch. was told it was an infected hair follicle. now on antibiotic. states the drug was cleared with Dr. Orvan Falconer. allergic to first one Follow-up by: Golden Circle RN,  April 11, 2010 3:30 PM  Additional Follow-up for Phone Call Additional follow up Details #1::        there are no appts this week with his md or another. will call pt back after I hear from md what he wants to happen next Additional Follow-up by: Golden Circle RN,  April 11, 2010 3:37 PM

## 2010-04-21 NOTE — Telephone Encounter (Signed)
RN spoke with Dr. Orvan Falconer.  Shared message from pt's wife with MD.  Pt OK to come at scheduled visit in April.  Pt to call if symptoms return. [name], RN

## 2010-04-24 DIAGNOSIS — A4902 Methicillin resistant Staphylococcus aureus infection, unspecified site: Secondary | ICD-10-CM

## 2010-04-24 HISTORY — DX: Methicillin resistant Staphylococcus aureus infection, unspecified site: A49.02

## 2010-04-25 ENCOUNTER — Ambulatory Visit: Payer: Medicare Other | Admitting: Physical Therapy

## 2010-04-26 ENCOUNTER — Ambulatory Visit: Payer: Medicare Other | Attending: Physical Medicine & Rehabilitation | Admitting: Physical Therapy

## 2010-04-26 ENCOUNTER — Ambulatory Visit: Payer: Medicare Other | Admitting: Physical Therapy

## 2010-04-26 DIAGNOSIS — IMO0001 Reserved for inherently not codable concepts without codable children: Secondary | ICD-10-CM | POA: Insufficient documentation

## 2010-04-26 DIAGNOSIS — M6281 Muscle weakness (generalized): Secondary | ICD-10-CM | POA: Insufficient documentation

## 2010-04-26 DIAGNOSIS — R5381 Other malaise: Secondary | ICD-10-CM | POA: Insufficient documentation

## 2010-04-26 DIAGNOSIS — R269 Unspecified abnormalities of gait and mobility: Secondary | ICD-10-CM | POA: Insufficient documentation

## 2010-05-02 ENCOUNTER — Other Ambulatory Visit: Payer: Self-pay | Admitting: Cardiovascular Disease

## 2010-05-02 ENCOUNTER — Encounter (HOSPITAL_COMMUNITY): Payer: Medicare Other | Attending: Cardiovascular Disease

## 2010-05-02 ENCOUNTER — Ambulatory Visit (INDEPENDENT_AMBULATORY_CARE_PROVIDER_SITE_OTHER): Payer: Medicare Other | Admitting: Pulmonary Disease

## 2010-05-02 ENCOUNTER — Encounter: Payer: Self-pay | Admitting: Pulmonary Disease

## 2010-05-02 VITALS — BP 124/66 | HR 73 | Temp 98.2°F | Ht 70.0 in | Wt 212.4 lb

## 2010-05-02 DIAGNOSIS — E119 Type 2 diabetes mellitus without complications: Secondary | ICD-10-CM | POA: Insufficient documentation

## 2010-05-02 DIAGNOSIS — G4733 Obstructive sleep apnea (adult) (pediatric): Secondary | ICD-10-CM

## 2010-05-02 DIAGNOSIS — N289 Disorder of kidney and ureter, unspecified: Secondary | ICD-10-CM | POA: Insufficient documentation

## 2010-05-02 DIAGNOSIS — Z951 Presence of aortocoronary bypass graft: Secondary | ICD-10-CM | POA: Insufficient documentation

## 2010-05-02 DIAGNOSIS — H548 Legal blindness, as defined in USA: Secondary | ICD-10-CM | POA: Insufficient documentation

## 2010-05-02 DIAGNOSIS — K219 Gastro-esophageal reflux disease without esophagitis: Secondary | ICD-10-CM | POA: Insufficient documentation

## 2010-05-02 DIAGNOSIS — I10 Essential (primary) hypertension: Secondary | ICD-10-CM | POA: Insufficient documentation

## 2010-05-02 DIAGNOSIS — R5381 Other malaise: Secondary | ICD-10-CM | POA: Insufficient documentation

## 2010-05-02 DIAGNOSIS — I4891 Unspecified atrial fibrillation: Secondary | ICD-10-CM | POA: Insufficient documentation

## 2010-05-02 DIAGNOSIS — I739 Peripheral vascular disease, unspecified: Secondary | ICD-10-CM | POA: Insufficient documentation

## 2010-05-02 DIAGNOSIS — Z5189 Encounter for other specified aftercare: Secondary | ICD-10-CM | POA: Insufficient documentation

## 2010-05-02 DIAGNOSIS — Z87891 Personal history of nicotine dependence: Secondary | ICD-10-CM | POA: Insufficient documentation

## 2010-05-02 DIAGNOSIS — Z954 Presence of other heart-valve replacement: Secondary | ICD-10-CM | POA: Insufficient documentation

## 2010-05-02 DIAGNOSIS — E785 Hyperlipidemia, unspecified: Secondary | ICD-10-CM | POA: Insufficient documentation

## 2010-05-02 DIAGNOSIS — I359 Nonrheumatic aortic valve disorder, unspecified: Secondary | ICD-10-CM | POA: Insufficient documentation

## 2010-05-02 DIAGNOSIS — I2510 Atherosclerotic heart disease of native coronary artery without angina pectoris: Secondary | ICD-10-CM | POA: Insufficient documentation

## 2010-05-02 DIAGNOSIS — Z7982 Long term (current) use of aspirin: Secondary | ICD-10-CM | POA: Insufficient documentation

## 2010-05-02 NOTE — Progress Notes (Signed)
  Subjective:    Patient ID: [name], male    DOB: Mar 02, 1927, 77 y.o.   MRN: [id number]  HPI 82/M for evaluation of obstructive sleep apnea  Oct '11 >> staph sepsis Nov 11 >> c .diff colitis, low plts Underwent CABG x3  Pig AVR in jan '12, startde cardiac rehab today He had remote sleep study in [facility], another sleep study 2005  - now in need of new supplier for home equipment  On VPAP auto ? 12 cm (got this machine 2 yrs ago) , mirage full face mask, pr increased to 12 cm in jan'12 after download check Referred to review, esp in setting of other med/hx changes since last eval for OSA - he also needs CPAP supplier ESS 18 , bedtime 10p, minimal latency, Nocturia + since prostate sx, oob at 0930 , feels refreshed , no headaches, daytime naps + Download 9/17-05/02/10 shows avg pr 15 cm,  AHI 14.4 residual with predominant hypopneas, some leak & excellent compliance There is no history suggestive of cataplexy, sleep paralysis or parasomnias     Review of Systems  Constitutional: Negative for fever, appetite change and unexpected weight change.  HENT: Positive for trouble swallowing. Negative for ear pain, congestion, sore throat, rhinorrhea, sneezing, dental problem and postnasal drip.   Eyes: Negative for redness.  Respiratory: Positive for shortness of breath. Negative for cough and wheezing.   Cardiovascular: Negative for chest pain, palpitations and leg swelling.  Gastrointestinal: Negative for nausea, vomiting, abdominal pain and diarrhea.  Genitourinary: Positive for urgency. Negative for dysuria.  Musculoskeletal: Negative for joint swelling.  Skin: Negative for rash.  Neurological: Positive for syncope and light-headedness. Negative for headaches.  Hematological: Does not bruise/bleed easily.  Psychiatric/Behavioral: Positive for dysphoric mood. The patient is not nervous/anxious.        Objective:   Physical Exam    Gen. Pleasant, well-nourished, in no distress,  normal affect ENT - no lesions, no post nasal drip, class 2 airway Neck: No JVD, no thyromegaly, no carotid bruits Lungs: no use of accessory muscles, no dullness to percussion, clear without rales or rhonchi  Cardiovascular: Rhythm regular, heart sounds  normal, no murmurs or gallops, no peripheral edema, sternotomy scar Abdomen: soft and non-tender, no hepatosplenomegaly, BS normal. Musculoskeletal: No deformities, no cyanosis or clubbing Neuro:  alert, non focal     Assessment & Plan:

## 2010-05-02 NOTE — Patient Instructions (Addendum)
We will get you a new supplier for CPAP. Current pressure will be  checked

## 2010-05-02 NOTE — Assessment & Plan Note (Addendum)
We will try to track down his baseline study. He appears to be compliant with CPAP. We will hook him up with a new DME company Increase pressure to 17 cm to see if we can eliminate hypopneas Weight loss encouraged, compliance with goal of at least 6 hrs every night is the expectation - he is meeting this goal Advised against medications with sedative side effects Cautioned against driving when sleepy - understanding that sleepiness will vary on a day to day basis

## 2010-05-03 ENCOUNTER — Encounter: Payer: Self-pay | Admitting: Internal Medicine

## 2010-05-03 ENCOUNTER — Ambulatory Visit (INDEPENDENT_AMBULATORY_CARE_PROVIDER_SITE_OTHER): Payer: Medicare Other | Admitting: Internal Medicine

## 2010-05-03 DIAGNOSIS — A0472 Enterocolitis due to Clostridium difficile, not specified as recurrent: Secondary | ICD-10-CM

## 2010-05-03 DIAGNOSIS — Z952 Presence of prosthetic heart valve: Secondary | ICD-10-CM

## 2010-05-03 DIAGNOSIS — A4902 Methicillin resistant Staphylococcus aureus infection, unspecified site: Secondary | ICD-10-CM

## 2010-05-03 DIAGNOSIS — Z951 Presence of aortocoronary bypass graft: Secondary | ICD-10-CM

## 2010-05-03 DIAGNOSIS — Z954 Presence of other heart-valve replacement: Secondary | ICD-10-CM

## 2010-05-03 LAB — GLUCOSE, CAPILLARY: Glucose-Capillary: 105 mg/dL — ABNORMAL HIGH (ref 70–99)

## 2010-05-03 NOTE — Assessment & Plan Note (Signed)
His diarrhea has resolved I would not do anything further at this time unless he develops recurrent diarrhea.

## 2010-05-03 NOTE — Progress Notes (Signed)
  Subjective:    Patient ID: [name], male    DOB: 1927-10-03, 77 y.o.   MRN: [id number]  HPI Mr. Jakubiak is in for his routine followup visit. He is feeling much better since he does aortic valve replacement and coronary artery bypass grafting. Postoperatively he did have problems with the MRSA abscess on his right calf that required incision and drainage. He was started on oral doxycycline and I recommended that he also start Flagyl as prophylaxis for recurrent C. difficile colitis. He had adverse reaction to the doxycycline so I switched him to oral Septra and for her and added measure her of protection against C. difficile I had him restart oral vancomycin which he had at home. His abscess resolved. He had 2 loose stools after starting the Septra but in retrospect he believes that was probably due to lactose intolerance after having 2 milkshakes. He is now off all antibiotics. He's not had any fever or diarrhea.    Review of Systems     Objective:   Physical Exam  Constitutional: He appears well-nourished. No distress.  HENT:  Mouth/Throat: Oropharynx is clear and moist. No oropharyngeal exudate.  Cardiovascular: Normal rate and regular rhythm.   No murmur heard. Pulmonary/Chest: Breath sounds normal. He has no wheezes.       A sternal incision is healing nicely without any evidence of infection.  Abdominal: Soft. Bowel sounds are normal. He exhibits no distension. There is no tenderness.  Skin:       There is an area of dusky redness on his inner right calf with some residual induration. There is no fluctuance or tenderness or drainage.          Assessment & Plan:

## 2010-05-04 ENCOUNTER — Other Ambulatory Visit: Payer: Self-pay | Admitting: Cardiovascular Disease

## 2010-05-04 ENCOUNTER — Encounter (HOSPITAL_COMMUNITY): Payer: Medicare Other

## 2010-05-04 LAB — GLUCOSE, CAPILLARY
Glucose-Capillary: 104 mg/dL — ABNORMAL HIGH (ref 70–99)
Glucose-Capillary: 76 mg/dL (ref 70–99)

## 2010-05-06 ENCOUNTER — Encounter (HOSPITAL_COMMUNITY): Payer: Medicare Other

## 2010-05-09 ENCOUNTER — Encounter (HOSPITAL_COMMUNITY): Payer: Medicare Other

## 2010-05-11 ENCOUNTER — Encounter (HOSPITAL_COMMUNITY): Payer: Medicare Other

## 2010-05-13 ENCOUNTER — Encounter (HOSPITAL_COMMUNITY): Payer: Medicare Other

## 2010-05-13 ENCOUNTER — Other Ambulatory Visit: Payer: Self-pay | Admitting: Cardiovascular Disease

## 2010-05-16 ENCOUNTER — Encounter (HOSPITAL_COMMUNITY): Payer: Medicare Other

## 2010-05-16 ENCOUNTER — Other Ambulatory Visit: Payer: Self-pay | Admitting: Cardiovascular Disease

## 2010-05-18 ENCOUNTER — Encounter: Payer: Self-pay | Admitting: Pulmonary Disease

## 2010-05-18 ENCOUNTER — Encounter (HOSPITAL_COMMUNITY): Payer: Medicare Other

## 2010-05-20 ENCOUNTER — Encounter (HOSPITAL_COMMUNITY): Payer: Medicare Other

## 2010-05-23 ENCOUNTER — Encounter (HOSPITAL_COMMUNITY): Payer: Medicare Other

## 2010-05-25 ENCOUNTER — Encounter (HOSPITAL_COMMUNITY): Payer: Medicare Other | Attending: Cardiovascular Disease

## 2010-05-25 ENCOUNTER — Encounter (HOSPITAL_COMMUNITY): Payer: Medicare Other

## 2010-05-25 DIAGNOSIS — Z954 Presence of other heart-valve replacement: Secondary | ICD-10-CM | POA: Insufficient documentation

## 2010-05-25 DIAGNOSIS — E119 Type 2 diabetes mellitus without complications: Secondary | ICD-10-CM | POA: Insufficient documentation

## 2010-05-25 DIAGNOSIS — Z5189 Encounter for other specified aftercare: Secondary | ICD-10-CM | POA: Insufficient documentation

## 2010-05-25 DIAGNOSIS — Z7982 Long term (current) use of aspirin: Secondary | ICD-10-CM | POA: Insufficient documentation

## 2010-05-25 DIAGNOSIS — E785 Hyperlipidemia, unspecified: Secondary | ICD-10-CM | POA: Insufficient documentation

## 2010-05-25 DIAGNOSIS — H548 Legal blindness, as defined in USA: Secondary | ICD-10-CM | POA: Insufficient documentation

## 2010-05-25 DIAGNOSIS — Z87891 Personal history of nicotine dependence: Secondary | ICD-10-CM | POA: Insufficient documentation

## 2010-05-25 DIAGNOSIS — I4891 Unspecified atrial fibrillation: Secondary | ICD-10-CM | POA: Insufficient documentation

## 2010-05-25 DIAGNOSIS — I2510 Atherosclerotic heart disease of native coronary artery without angina pectoris: Secondary | ICD-10-CM | POA: Insufficient documentation

## 2010-05-25 DIAGNOSIS — N289 Disorder of kidney and ureter, unspecified: Secondary | ICD-10-CM | POA: Insufficient documentation

## 2010-05-25 DIAGNOSIS — R5381 Other malaise: Secondary | ICD-10-CM | POA: Insufficient documentation

## 2010-05-25 DIAGNOSIS — I359 Nonrheumatic aortic valve disorder, unspecified: Secondary | ICD-10-CM | POA: Insufficient documentation

## 2010-05-25 DIAGNOSIS — I739 Peripheral vascular disease, unspecified: Secondary | ICD-10-CM | POA: Insufficient documentation

## 2010-05-25 DIAGNOSIS — Z951 Presence of aortocoronary bypass graft: Secondary | ICD-10-CM | POA: Insufficient documentation

## 2010-05-25 DIAGNOSIS — I10 Essential (primary) hypertension: Secondary | ICD-10-CM | POA: Insufficient documentation

## 2010-05-25 DIAGNOSIS — K219 Gastro-esophageal reflux disease without esophagitis: Secondary | ICD-10-CM | POA: Insufficient documentation

## 2010-05-25 DIAGNOSIS — G4733 Obstructive sleep apnea (adult) (pediatric): Secondary | ICD-10-CM | POA: Insufficient documentation

## 2010-05-27 ENCOUNTER — Encounter (HOSPITAL_COMMUNITY): Payer: Medicare Other

## 2010-05-30 ENCOUNTER — Encounter (HOSPITAL_COMMUNITY): Payer: Medicare Other

## 2010-05-31 ENCOUNTER — Encounter: Payer: Self-pay | Admitting: Internal Medicine

## 2010-06-01 ENCOUNTER — Encounter (HOSPITAL_COMMUNITY): Payer: Medicare Other

## 2010-06-03 ENCOUNTER — Encounter (HOSPITAL_COMMUNITY): Payer: Medicare Other

## 2010-06-06 ENCOUNTER — Encounter (HOSPITAL_COMMUNITY): Payer: Medicare Other

## 2010-06-08 ENCOUNTER — Encounter (HOSPITAL_COMMUNITY): Payer: Medicare Other

## 2010-06-10 ENCOUNTER — Encounter: Payer: Self-pay | Admitting: Internal Medicine

## 2010-06-10 ENCOUNTER — Encounter (HOSPITAL_COMMUNITY): Payer: Medicare Other

## 2010-06-10 ENCOUNTER — Other Ambulatory Visit: Payer: Self-pay | Admitting: Cardiovascular Disease

## 2010-06-10 NOTE — Op Note (Signed)
NAME:  [name], [name]                     ACCOUNT NO.:  [id number]   MEDICAL RECORD NO.:  [id number]                   PATIENT TYPE:  AMB   LOCATION:  [facility]                                 FACILITY:  [facility]   PHYSICIAN:  Ronald L. Ovidio Hanger, M.D.           DATE OF BIRTH:  07-16-1927   DATE OF PROCEDURE:  09/10/2003  DATE OF DISCHARGE:                                 OPERATIVE REPORT   DIAGNOSIS:  Questionable right hydronephrosis.   OPERATIVE PROCEDURES:  1. Cystoscopy.  2. Bilateral retrograde ureteropyelograms.   SURGEON:  [name], M.D.   ANESTHESIA:  LMA.   ESTIMATED BLOOD LOSS:  Negligible.   TUBES:  None.   COMPLICATIONS:  None.   INDICATION FOR PROCEDURE:  Ms. Perotti is a very nice 77 year old white male  who had a radical prostatectomy for prostate cancer in 1993.  He has done  great from that but recently had an ultrasound that had a questionable right  hydronephrosis.  He has a known horseshoe kidney.  IVP with tomography was  performed.  The left side was essentially normal with malrotation, as to be  expected, on the right side.  It was very difficult to visualize.  The  distal ureter could not be seen well, and there was some questionable  dilatation.  After understanding risks, benefits, and alternatives, he has  elected to proceed with cystoscopy and bilateral retrograde  ureteropyelogram.   PROCEDURE IN DETAIL:  The patient was placed in the supine position, after  proper LMA anesthesia was prepped and draped with Betadine in sterile  fashion.  Cystourethroscopy was performed with a 22.5 [place] Olympus  panendoscope.  The urethrovesical junction was noted to be widely patent.  There were no lesions in the bladder.  Grade 1 trabeculation was noted.  Efflux of clear urine was noted from the normally-placed ureteral orifices  bilaterally.  Utilizing a 6 [place] open-ended catheter, bilateral retrograde  ureteropyelograms were performed with  Renografin.  He was noted to have  delicate ureters bilaterally.  No lesions were noted in it.  He had  bilateral malrotation, as to be expected with horseshoe kidney, but the  posterior calyces were quite delicate and no filling defects were noted, and  no lesions or obstruction were noted.  The system appeared to drain well.  The bladder was drained, the panendoscope was removed.  The patient was  taken to the recovery room in stable condition.                                               Ronald L. Ovidio Hanger, M.D.   RLD/MEDQ  D:  09/10/2003  T:  09/11/2003  Job:  [id number]

## 2010-06-10 NOTE — Op Note (Signed)
[name], [name]           ACCOUNT NO.:  [id number]   MEDICAL RECORD NO.:  [id number]          PATIENT TYPE:  INP   LOCATION:  0009                         FACILITY:  [facility]   PHYSICIAN:  Georges Lynch. Gioffre, M.D.DATE OF BIRTH:  1927/04/11   DATE OF PROCEDURE:  07/19/2004  DATE OF DISCHARGE:                                 OPERATIVE REPORT   SURGEON:  Georges Lynch. Ethelle Lyon, M.D.   ASSISTANT:  Madlyn Frankel. Charlann Boxer, M.D.   PREOPERATIVE DIAGNOSIS:  Severe degenerative arthritis of the left hip.   POSTOPERATIVE DIAGNOSIS:  Severe degenerative arthritis of the left hip.   OPERATION:  Left total hip arthroplasty utilizing the DePuy porous coated  system.  The acetabular cup was an ASR size 54 porous coated cup.  We used a  Summit tapered hip stem, porous coated, size 7 with a high offset.  The ball  size was a 47 mm metal ball, tapered sleeve size +5.   PROCEDURE:  Under spinal anesthesia, routine orthopedic prep and draping of  the left hip was carried out.  The patient received 1 gram IV Ancef preop.  Following this, a posterolateral approach of the hip was carried out,  bleeders identified and cauterized. A self-retaining retractor was inserted.  I incised the iliotibial band and extended the incision rostrally and  separated the gluteal muscles by blunt dissection with great care taken not  to injure the underlying sciatic nerve.  Following this, I then detached the  external rotators.  I then did a capsulectomy, dislocated the head, and cut  the femoral neck at the appropriate neck length utilizing the trial ball and  stem for measurement purposes.  Following this, we then reamed and rasped  the femoral shaft up to a size 7 Summit femoral stem.  We had a very nice  fit.  Following this, we then reamed the acetabulum up to a size 54 mm cup  and then inserted the permanent metal ASR cup which was 54 mm diameter.  We  then went through trials and felt we first tired a +2 and then went to  a +5  with a high offset neck.  The 47 mm diameter ball was then inserted, went  through the trials, had excellent fit and excellent stability.  Note, he was  a little over half inch short prior to surgery.  Following this, we removed  the trial components, inserted our permanent femoral stem, and then our  permanent tapered sleeve which was the +5, then the ASR cup measured 54 mm  dimensions and we then inserted a 47 mm ASR ball.  We then irrigated the  acetabulum to make sure there were no soft tissue structures in the  acetabulum.  We then reduced the hip, took the hip through range of motion,  and had excellent stability.  We then put two drill holes in the greater  trochanter and reattached the external rotators in the usual fashion.  We  then closed the wound in layers in the usual fashion.  The skin was closed  with metal stapes.  A sterile Neosporin dressing was applied.  The  patient  left the operating room in satisfactory condition.       RAG/MEDQ  D:  07/19/2004  T:  07/19/2004  Job:  [id number]

## 2010-06-10 NOTE — Consult Note (Signed)
[name], [name]           ACCOUNT NO.:  [id number]   MEDICAL RECORD NO.:  [id number]          PATIENT TYPE:  INP   LOCATION:  1510                         FACILITY:  [facility]   PHYSICIAN:  Mallory Shirk, MD     DATE OF BIRTH:  1927/07/26   DATE OF CONSULTATION:  07/24/2004  DATE OF DISCHARGE:                                   CONSULTATION   REASON FOR CONSULTATION:  Somnolence/altered mental status, management of  medical problems i.e. diabetes, hypertension.   HISTORY OF PRESENT ILLNESS:  [name] is a very pleasant 77 year old  Caucasian gentleman with a history of obstructive sleep apnea using CPAP at  night, diabetes mellitus, HTN, is status post left total hip arthroplasty  postop day #5; at rounds this a.m. when Dr. Otelia Sergeant saw him, the patient was  unable to keep up a conversation, was appearing somnolent, tired,  unarousable and an IM consult was requested for evaluation.   During the time of my evaluation, the patient was sitting up in the chair,  talking about his golf game and was able to ambulate in the halls using a  walker, with his daughter.  Daughter at bedside, states that mentation is at  baseline.  The patient was able to accurately state his age, date of birth,  and is alert and oriented x3, answers questions appropriately.  The patient  also had some hiccups that started after his surgery and have been  continuous since then.  At the time of exam the patient denies any acute  discomfort.   PAST MEDICAL HISTORY:  1.  History of CVA.  2.  Diabetes mellitus.  3.  Hypertension.  4.  Sleep apnea.  5.  Prostate CA.  6.  Status post left hip arthroplasty.   MEDICATIONS AT THE TIME OF CONSULTATION:  1.  Allopurinol 300 mg p.o. daily.  2.  Diltiazem 360 mg p.o. daily.  3.  Dipyridamole 50 mg p.o. b.i.d.  4.  Docusate sodium 100 mg p.o. b.i.d.  5.  Lexapro 10 mg p.o. daily.  6.  Starlix 120 mg p.o. t.i.d.  7.  Protonix 40 mg p.o. daily.  8.   Pramipexole (Mirapex) 0.75 mg p.o. daily.  9.  Zocor 40 mg p.o. daily.  10. Trinsicon 1 capsule p.o. t.i.d.  11. Coumadin per pharmacy protocol.  12. Tylenol 650 q.6h. p.r.n.  13. Dulcolax 10 mg pr p.r.n. constipation.  14. Benadryl 25 mg p.o. q.6h. p.r.n.  15. Enema of choice daily p.r.n.  16. Maalox 30 milliliters p.o. p.r.n.  17. Reglan 10 mg p.o. q.8h. p.r.n.  18. Senna/docusate 1 tablet p.o. p.r.n.   ALLERGIES:  THE PATIENT HAS NO KNOWN DRUG ALLERGIES.   FAMILY HISTORY:  Noncontributory.   SOCIAL HISTORY:  The patient drinks occasional alcohol, does not smoke.   REVIEW OF SYSTEMS:  Greater than 10 systems reviewed other than HPI  negative.   PHYSICAL EXAM:  VITAL SIGNS:  Blood pressure 158/71, pulse 82, respirations  18, temperature 98.7.  GENERAL:  Very pleasant elderly Caucasian gentleman sitting up in a chair,  in no acute distress, alert and oriented  x3.  Cooperative with the exam.  HEENT:  Normocephalic and atraumatic, PERRL, sclerae anicteric, mucous  membranes moist.  NECK:  Supple, no LAD, no JVD.  LUNGS:  Clear to auscultation bilaterally, decreased air entry in the bases  bilaterally, no wheezes.  CARDIOVASCULAR:  S1, S2 regular rate and rhythm, no murmurs, rubs or  gallops.  ABDOMEN:  Soft, positive bowel sounds, no tenderness, no masses.  EXTREMITIES:  No cyanosis, clubbing or edema. Hip dressing clean, dry and  intact.  Large ecchymosis on the left flank surrounding the incision.  NEUROLOGIC:  Cranial nerves II-XII grossly intact.  Sensory and motor are  within normal limits.  DTRs 2+ symmetrical all four extremities.  Finger-  nose-finger within normal limits.  No focal deficits seen.   LABS:  WBC 12.8, hemoglobin 12.0, hematocrit 34.6, platelets 146, sodium  137, potassium 3.9, chloride 103, carbon dioxide 22, BUN 15, creatinine 1.0,  glucose 125, calcium 8.5, total bilirubin 2.6, AST 44, ALT 50, albumin 2.6,  beta Natriuretic Peptide 251.0.    IMPRESSION:  A 77 year old Caucasian gentleman status post left total hip  arthroplasty postop day #5.   1.  Mental status:  The patient appears to be at baseline and no cognitive      defects seen, no focal neurologic deficit seen on exam.  Per daughter at      bedside, the patient is at baseline mentation.  We will continue to      observe the patient, all narcotic analgesics have been discontinued.      The patients pain managed with Tylenol 650 mg p.o. q.6h. p.r.n.  Family      states that the patient had an anxiolytic at home, we will start him on      Xanax 0.25 mg p.o. b.i.d.  2.  Pain management:  As stated above with Tylenol.  3.  Hiccups:  The patients hiccups have lasted more than 48 hours, but less      than a month by definition these are persistent hiccups.  We will start      a trial of Baclofen 5 mg p.o. t.i.d.  The patient is able to sleep with      these hiccups.  4.  Diabetes mellitus:  The patient is on Starlix.  CBGs were well-      controlled, range 101-145, we will check an A1c in the morning along      with a fasting lipid profile.  5.  Hypertension:  The patients blood pressure is 158/71, pain likely      contributory to elevated blood pressures, the patients systolic during      this hospital stay have ranged from 104-167.  We will start him on a low      dose ACE inhibitor in light of his diabetes, Lisinopril 2.5 mg p.o.      daily and we will start him on a perioperative beta-blocker at 12.5 mg      p.o. b.i.d.  6.  Restless leg syndrome:  We will continue his Mirapex.  7.  Gout:  We will continue his allopurinol.  8.  History of cerebrovascular accident.  His ASA and dipyridamole is      continued.  9.  Obstructive sleep apnea:  The patient has a CPAP which he uses at home,      at bedside, may use this at night.   Thank you so much for this consult.  [facility] team C will follow this  patient  with you.  If you have any questions please do not hesitate to  call [phone].       GDK/MEDQ  D:  07/24/2004  T:  07/24/2004  Job:  [id number]   cc:   Kerrin Champagne, M.D.  [address]  [place]  [place] [address]  Fax: [phone]   Madlyn Frankel. Charlann Boxer, M.D.  [facility]  [address]  [address]  [place]  [place] [address]  Fax: [phone]   Georges Lynch. Darrelyn Hillock, M.D.  [facility]  [address]  [address]  [place]  [place] [address]  Fax: [phone]   Brooke Bonito, M.D.  [address]  [place]  [place] [address]  Fax: [phone]

## 2010-06-10 NOTE — H&P (Signed)
[name], [name]           ACCOUNT NO.:  [id number]   MEDICAL RECORD NO.:  [id number]          PATIENT TYPE:  INP   LOCATION:  NA                           FACILITY:  [facility]   PHYSICIAN:  Georges Lynch. Gioffre, M.D.DATE OF BIRTH:  11/10/1927   DATE OF ADMISSION:  DATE OF DISCHARGE:                                HISTORY & PHYSICAL   CHIEF COMPLAINT:  Left hip pain for several months.  He has degenerative  arthritis in both hips but the left is far worse than the right.  He has  painful ambulation.  He has had no relief with Celebrex and he is ready to  proceed with a left total hip arthroplasty.   DRUG ALLERGIES:  NONE.   PRIMARY CARE Yarnell Arvidson:  Dr. Juleen China.   CURRENT MEDICATIONS:  1.  He is on aspirin 80 mg twice daily.  2.  Prilosec 20 mg daily.  3.  Dipyridamole 50 mg two times daily.  4.  Allopurinol 300 mg once daily.  5.  Celebrex 200 mg once daily.  6.  Lexapro 10 mg daily.  7.  Starlix 120 mg three times daily.  8.  Mirapex 0.25 mg up to four at bedtime.  9.  Cardizem LA 360 mg daily.  10. Centrum Silver vitamins.  11. Calcium twice daily.  12. Tylenol Arthritis, two in the morning.  13. Tylenol PM two at night.  14. One fish oil tablet daily.  15. Vitamin C daily.  16. Stool softener daily.  17. Crestor 10 mg daily.   PREVIOUS SURGICAL HISTORY:  The patient had a blood clot in his right ocular  artery and he had surgery with Dr. Julius Bowels at [facility].  He also had  prostate cancer and his prostate was removed by Dr. Earlene Plater in 1993.  History  of colon polyps, nonmalignant, anal fissure repair in 1996, right hernia  repair in 1999, left hernia repair in 2001.   PAST MEDICAL HISTORY:  Significant for cerebrovascular accident, non-insulin  dependent diabetes mellitus, pneumonia, hypertension, sleep apnea, and  prostate cancer.   REVIEW OF SYSTEMS:  GENERAL:  Denies weight change, fever, chills or  fatigue.  HEENT:  Denies headache, visual changes, tinnitus,  hearing loss,  sore throat.  CARDIOVASCULAR:  Denies chest pain, palpitations, shortness of  breath, orthopnea.  PULMONARY:  Denies dyspnea, wheezing, cough, sputum  production, hemoptysis.  GASTROINTESTINAL:  Denies nausea and vomiting,  hematemesis or abdominal pain.  GENITOURINARY:  Denies dysuria, frequency,  urgency, hematuria.  ENDOCRINE:  Denies polyuria, polyuria, appetite change,  heat or cold intolerance.  MUSCULOSKELETAL:  The patient has bilateral hip  pain.  NEUROLOGICAL:  Denies dizziness, vertigo, syncope, seizures.  SKIN:  Denies itching, rashes, masses or moles.   PHYSICAL EXAMINATION:  VITAL SIGNS:  Temperature is 97.8, pulse is 76,  respirations 18, blood pressure 150/80.  GENERAL:  A 77 year old male in no acute distress.  HEENT:  Hearing aids bilaterally.  PERRLA.  EOM's intact.  Pharynx clear.  NECK:  Supple.  CHEST:  Clear to auscultation bilaterally.  No wheezing, rales or rhonchi  noted.  HEART:  A  2/6 systolic ejection murmur, regular rate.  ABDOMEN:  Positive bowel sounds, soft and nontender.  EXTREMITIES:  Examination of his left hip reveals painful restricted range  of motion.  He has an antalgic gait.  He is about 0.5 inch shorter on the  left when compared to the right.  SKIN: Warm and dry.   X-ray of his left hip showed decreased joint space.   IMPRESSION:  Degenerative arthritis, left hip.   PLAN:  The patient is to be admitted to [facility] July 18, 2004  to undergo a left total hip arthroplasty.      Haynes Hoehn  D:  07/14/2004  T:  07/14/2004  Job:  [id number]

## 2010-06-10 NOTE — Discharge Summary (Signed)
[name], [name]           ACCOUNT NO.:  [id number]   MEDICAL RECORD NO.:  [id number]          PATIENT TYPE:  INP   LOCATION:  1510                         FACILITY:  [facility]   PHYSICIAN:  Georges Lynch. Gioffre, M.D.DATE OF BIRTH:  10-26-1927   DATE OF ADMISSION:  07/19/2004  DATE OF DISCHARGE:  07/26/2004                                 DISCHARGE SUMMARY   ADMISSION DIAGNOSES:  1.  Degenerative arthritis, left hip.  2.  Cerebrovascular accident.  3.  Non-insulin-dependent diabetes.  4.  Pneumonia.  5.  Hypertension.  6.  Sleep apnea.  7.  Prostate cancer.   POSTOPERATIVE DIAGNOSES:  1.  Degenerative arthritis, left hip, status post left total knee      arthroplasty.  2.  Cerebrovascular accident.  3.  Non-insulin-dependent diabetes.  4.  Pneumonia.  5.  Hypertension.  6.  Sleep apnea.  7.  Prostate cancer.   PROCEDURE:  Patient was taken to the operating room on July 19, 2004 to  undergo a left total knee arthroplasty.   SURGEON:  Georges Lynch. Darrelyn Hillock, M.D.   ASSISTANT:  Madlyn Frankel. Charlann Boxer, M.D.   ANESTHESIA:  Spinal.   BRIEF HISTORY:  This is a 77 year old male who has had left hip pain for  several months.  He has degenerative arthritis of both hips, but the left is  far worse than the right.  He has difficulty ambulating, and he has very  painful ambulation.  He has had no relief with nonsteroidal anti-  inflammatory medications and elects to proceed with a left total hip  arthroplasty.   LABORATORY DATA:  Admission CBC:  WBC 5.1, RBC 4.36, hemoglobin 14.4,  platelet count 137, slightly low.  Admission PT 12.8, INR 1, PTT 30.  Admission chemistries:  Sodium 140, potassium 4.1, chloride 103, CO2 28,  glucose 92, BUN 20, creatinine 1.  Calcium 9.2, total protein 6.7, albumin  4.2.  BNP 251, slightly elevated.  Hemoglobin A1C 5.7, which is within  normal limits.  Admission urinalysis normal.  TSH 1.179, normal.  Patient's  blood type is A+.  Negative antibody  screen.   Preoperative x-ray of the left hip reveals moderate to severe osteoarthritis  involving the left hip.   Preoperative chest x-ray:  No acute disease.   Postoperative x-ray of the left hip:  The left hip prosthesis has normal  alignment.   Chest x-ray repeated postop:  Small bilateral pleural effusions, right  greater than left.  Linear atelectasis, right lower lobe, otherwise no  active disease.   Abdominal ultrasound:  Mild fatty infiltration of the liver.  Mild  gallbladder wall thickening.  Minimal sludge.  No evidence of stones.  A  horse-shoe shaped kidney with a simple right renal cyst.   HOSPITAL COURSE:  The patient was admitted to [facility] and taken  to the operating room.  He underwent the above-stated procedure without  complication.  He tolerated the procedure well and was allowed to return to  the recovery room and then to the orthopedic floor to continue his  postoperative care.  The patient was placed on PCA analgesia for  pain  control.  The patient's pain was well controlled, and his PCA was  discontinued on postoperative day #3.  The patient had a postoperative drop  in his hemoglobin to 7.1.  By postoperative day #3, he was transfused with 2  units of packed red cells.  His hemoglobin stabilized to 11.8 at the time of  discharge.  PT/OT was consulted for gait training ambulation.  Patient was  doing well in regards to ambulation, but he developed a problem with  hiccups.  This occurred on postop day #2.   Patient did require a blood transfusion on postoperative day #4, due to  decreasing his hemoglobin.  Medical consult was called on July 2nd.  Due to  his complicated medical history, he continued to have problems with hiccups  and was having some mental status changes as well.  It is found that the  patient had restless legs syndrome, which may have contributed to his  difficulty sleeping, and he was prescribed with the medication to help  with  the symptoms.  Finally, by postoperative day #6, it was felt that the  patient had progressed enough with the therapist, and had the other medical  issues worked up so that he was stable enough to go home.  Patient was  discharged home on July 26, 2004.   DISCHARGE MEDICATIONS:  1.  Coumadin per pharmacy protocol.  2.  Robaxin every 6 hours as needed for spasm.  3.  Vicodin 1-2 every 6 hours as needed for pain.   FOLLOW UP:  Patient will follow up with Dr. Darrelyn Hillock one week from the date  of discharge from the hospital.  He will follow up with Dr. Juleen China in one  week as well.   ACTIVITY:  Full weightbearing with walker.   WOUND CARE:  Daily dressing changes.   CONDITION ON DISCHARGE:  Stable.      Haynes Hoehn  D:  08/24/2004  T:  08/24/2004  Job:  [id number]

## 2010-06-10 NOTE — Op Note (Signed)
[facility]  Patient:    [name], [name]                  MRN: [id number] Proc. Date: 03/16/99 Adm. Date:  [id number] Attending:  Brandy Hale CC:         Lucrezia Starch. Ovidio Hanger, M.D.             Bernadene Person, M.D.                           Operative Report  PREOPERATIVE DIAGNOSIS:  Left inguinal hernia.  POSTOPERATIVE DIAGNOSIS:  Left inguinal hernia.  OPERATION PERFORMED:  Repair of left inguinal hernia with mesh.  SURGEON:  Angelia Mould. Derrell Lolling, M.D.  ANESTHESIA:  INDICATIONS FOR PROCEDURE:  The patient is a 77 year old white male who underwent a right inguinal hernia repair in 1999 and a radical prostatectomy in 1993.  He comes back to see [place] at this time because of a left inguinal hernia which is enlarging. He requests that this be repaired before [place].  One examination he has a moderate-sized left inguinal hernia which is completely reducible. No evidence of recurrent hernia on the right.  He is brought to the operating room electively for repair of his left inguinal hernia.  DESCRIPTION OF PROCEDURE:  The patient was placed supine on the operating table. He was monitored and sedated by the anesthesia department.  The left groin was prepped and draped in a sterile fashion.  1% Xylocaine with epinephrine was used as a local infiltration anesthetic and a left ilioinguinal nerve block.  A transverse incision was made in the left groin.  Dissection was carried down through the subcutaneous tissues.  The aponeurosis of the external oblique was exposed and incised in the direction of its fibers, opening up the external inguinal ring. The external oblique was retracted.  The cord structures were mobilized and encircled with a Penrose drain.  Cremasteric muscle fibers were skeletonized.  We dissected out a large indirect hernia sac and this was dissected all the way back to the level of the internal  ring.  We opened the sac and found that there was some fatty omentum in the sac which was easily reduced.  The sac was then twisted and suture ligated at the level of the internal ring with a suture ligature of 3-0 silk. he redundant sac was excised and sent for pathology.  The floor of the inguinal canal was somewhat weak but not bulging very much.  Medially near the midline we felt in the deep muscle tissues some firm tissue which I interpreted as being related either to his lower lidline scar from his prostatectomy or mesh from his right-sided hernia.  This was not exposed or disturbed.  The floor of the inguinal canal was repaired and reinforced with an onlay graft of Marlex mesh.  The mesh was cut into an appropriate shape and sutured in place with running sutures of 2-0 Prolene.  The mesh was sutured so as to generously overlap the fascia at the pubic tubercle, along the inguinal ligament inferiorly, then along the conjoined tendon superiorly.  The mesh was incised laterally so as to wrap around the cord structures at the internal ring.  The tails of the mesh were overlapped laterally and the suture lines were completed.  This provided a very secure repair both medial and lateral to the cord structures but allowed an  adequate opening for the cord structures.  The wound was irrigated with saline.  Hemostasis was excellent. The external oblique was closed with a running suture of 2-0 Vicryl, placing the cord structures deep to the external oblique.  Scarpas fascia was closed with 3-0 Vicryl and the skin closed with a running subcuticular suture of 4-0 Vicryl and  Steri-Strips.  Clean bandages were placed and the patient taken to the recovery  room in stable condition.  Estimated blood loss was about 10 cc.  Complications  were none.  Sponge, needle and instrument counts were correct. DD:  03/16/99 TD:  03/16/99 Job: [id number]

## 2010-06-13 ENCOUNTER — Encounter (HOSPITAL_COMMUNITY): Payer: Medicare Other

## 2010-06-15 ENCOUNTER — Encounter (HOSPITAL_COMMUNITY): Payer: Medicare Other

## 2010-06-17 ENCOUNTER — Encounter (HOSPITAL_COMMUNITY): Payer: Medicare Other

## 2010-06-20 ENCOUNTER — Encounter (HOSPITAL_COMMUNITY): Payer: Medicare Other

## 2010-06-22 ENCOUNTER — Encounter (HOSPITAL_COMMUNITY): Payer: Medicare Other

## 2010-06-22 ENCOUNTER — Encounter: Payer: Self-pay | Admitting: Internal Medicine

## 2010-06-23 ENCOUNTER — Ambulatory Visit (INDEPENDENT_AMBULATORY_CARE_PROVIDER_SITE_OTHER): Payer: Medicare Other | Admitting: Cardiothoracic Surgery

## 2010-06-23 DIAGNOSIS — I2510 Atherosclerotic heart disease of native coronary artery without angina pectoris: Secondary | ICD-10-CM

## 2010-06-23 DIAGNOSIS — I359 Nonrheumatic aortic valve disorder, unspecified: Secondary | ICD-10-CM

## 2010-06-24 ENCOUNTER — Encounter (HOSPITAL_COMMUNITY): Payer: Medicare Other | Attending: Cardiovascular Disease

## 2010-06-24 ENCOUNTER — Encounter (HOSPITAL_COMMUNITY): Payer: Medicare Other

## 2010-06-24 DIAGNOSIS — I2510 Atherosclerotic heart disease of native coronary artery without angina pectoris: Secondary | ICD-10-CM | POA: Insufficient documentation

## 2010-06-24 DIAGNOSIS — E119 Type 2 diabetes mellitus without complications: Secondary | ICD-10-CM | POA: Insufficient documentation

## 2010-06-24 DIAGNOSIS — R5381 Other malaise: Secondary | ICD-10-CM | POA: Insufficient documentation

## 2010-06-24 DIAGNOSIS — N289 Disorder of kidney and ureter, unspecified: Secondary | ICD-10-CM | POA: Insufficient documentation

## 2010-06-24 DIAGNOSIS — I4891 Unspecified atrial fibrillation: Secondary | ICD-10-CM | POA: Insufficient documentation

## 2010-06-24 DIAGNOSIS — I10 Essential (primary) hypertension: Secondary | ICD-10-CM | POA: Insufficient documentation

## 2010-06-24 DIAGNOSIS — H548 Legal blindness, as defined in USA: Secondary | ICD-10-CM | POA: Insufficient documentation

## 2010-06-24 DIAGNOSIS — E785 Hyperlipidemia, unspecified: Secondary | ICD-10-CM | POA: Insufficient documentation

## 2010-06-24 DIAGNOSIS — Z954 Presence of other heart-valve replacement: Secondary | ICD-10-CM | POA: Insufficient documentation

## 2010-06-24 DIAGNOSIS — I739 Peripheral vascular disease, unspecified: Secondary | ICD-10-CM | POA: Insufficient documentation

## 2010-06-24 DIAGNOSIS — Z87891 Personal history of nicotine dependence: Secondary | ICD-10-CM | POA: Insufficient documentation

## 2010-06-24 DIAGNOSIS — Z5189 Encounter for other specified aftercare: Secondary | ICD-10-CM | POA: Insufficient documentation

## 2010-06-24 DIAGNOSIS — I359 Nonrheumatic aortic valve disorder, unspecified: Secondary | ICD-10-CM | POA: Insufficient documentation

## 2010-06-24 DIAGNOSIS — K219 Gastro-esophageal reflux disease without esophagitis: Secondary | ICD-10-CM | POA: Insufficient documentation

## 2010-06-24 DIAGNOSIS — Z7982 Long term (current) use of aspirin: Secondary | ICD-10-CM | POA: Insufficient documentation

## 2010-06-24 DIAGNOSIS — G4733 Obstructive sleep apnea (adult) (pediatric): Secondary | ICD-10-CM | POA: Insufficient documentation

## 2010-06-24 DIAGNOSIS — Z951 Presence of aortocoronary bypass graft: Secondary | ICD-10-CM | POA: Insufficient documentation

## 2010-06-24 NOTE — Assessment & Plan Note (Signed)
OFFICE VISIT  [name], [name] DOB:  [birth year]                                        Jun 23, 2010 CHART #:  [id number]  The patient comes to the office today in followup after his aortic valve replacement and coronary artery bypass grafting x3 done January 28, 2010. Considering his very poor preoperative state, he seems to be making a reasonable recovery.  He is now about halfway through cardiac rehab and will complete it in the middle of July.  His wife does note that he does not have much energy and has some cognitive issues.  This appears to be chronic and was ongoing prior to surgery, but it has been persistent. He has had no recurrent angina or evidence of congestive heart failure. A follow up stress test and echocardiogram were done at [facility] and showed good function of his valve, normal LV function without evidence of ischemia.  On exam today, his blood pressure 138/77, pulse is 70, respiratory rate is 16, and O2 saturation is 95% on room air.  Sternum is stable and well healed.  His lungs are clear bilaterally.  He has very mild pedal edema. He was treated by Dermatology for a staph infection involving the right lower leg separate from the endovascular vein harvest site.  There was one small scar that was crusted area in right lower leg that was removed in the office today.  There does not appear to be any active infection.  Overall, he seems to be making reasonable progress postoperatively.  I have encouraged him to be enrolled in the cardiac rehab program.  His wife will discuss with his primary care doctor, Dr. Felicity Coyer whether he should have further evaluation for his cognition.  He did have a CT scan ordered by Ophthalmology 2 weeks ago and according to the patient's report was normal.  I have not made him a return appointment but would be glad to see him anytime in his or Dr. Hazle Coca request.  Sheliah Plane,  MD Electronically Signed  EG/MEDQ  D:  06/23/2010  T:  06/24/2010  Job:  [id number]  cc:   Nanetta Batty, M.D. Valerie A. Felicity Coyer, MD Cliffton Asters, M.D.

## 2010-06-27 ENCOUNTER — Encounter (HOSPITAL_COMMUNITY): Payer: Medicare Other

## 2010-06-28 ENCOUNTER — Encounter: Payer: Self-pay | Admitting: Internal Medicine

## 2010-06-29 ENCOUNTER — Encounter (HOSPITAL_COMMUNITY): Payer: Medicare Other

## 2010-07-01 ENCOUNTER — Encounter (HOSPITAL_COMMUNITY): Payer: Medicare Other

## 2010-07-04 ENCOUNTER — Encounter (HOSPITAL_COMMUNITY): Payer: Medicare Other

## 2010-07-06 ENCOUNTER — Encounter (HOSPITAL_COMMUNITY): Payer: Medicare Other

## 2010-07-08 ENCOUNTER — Other Ambulatory Visit: Payer: Self-pay | Admitting: Oncology

## 2010-07-08 ENCOUNTER — Encounter (HOSPITAL_COMMUNITY): Payer: Medicare Other

## 2010-07-08 ENCOUNTER — Encounter (HOSPITAL_BASED_OUTPATIENT_CLINIC_OR_DEPARTMENT_OTHER): Payer: Medicare Other | Admitting: Oncology

## 2010-07-08 DIAGNOSIS — D72821 Monocytosis (symptomatic): Secondary | ICD-10-CM

## 2010-07-08 DIAGNOSIS — E119 Type 2 diabetes mellitus without complications: Secondary | ICD-10-CM

## 2010-07-08 DIAGNOSIS — I10 Essential (primary) hypertension: Secondary | ICD-10-CM

## 2010-07-08 DIAGNOSIS — Z8546 Personal history of malignant neoplasm of prostate: Secondary | ICD-10-CM

## 2010-07-08 DIAGNOSIS — I2510 Atherosclerotic heart disease of native coronary artery without angina pectoris: Secondary | ICD-10-CM

## 2010-07-08 DIAGNOSIS — D696 Thrombocytopenia, unspecified: Secondary | ICD-10-CM

## 2010-07-08 LAB — COMPREHENSIVE METABOLIC PANEL
ALT: 16 U/L (ref 0–53)
CO2: 29 mEq/L (ref 19–32)
Calcium: 9.3 mg/dL (ref 8.4–10.5)
Chloride: 103 mEq/L (ref 96–112)
Creatinine, Ser: 1.02 mg/dL (ref 0.50–1.35)
Glucose, Bld: 92 mg/dL (ref 70–99)
Total Bilirubin: 0.5 mg/dL (ref 0.3–1.2)

## 2010-07-08 LAB — CBC WITH DIFFERENTIAL/PLATELET
Basophils Absolute: 0 10*3/uL (ref 0.0–0.1)
EOS%: 1.2 % (ref 0.0–7.0)
Eosinophils Absolute: 0.1 10*3/uL (ref 0.0–0.5)
HCT: 42.1 % (ref 38.4–49.9)
HGB: 14.6 g/dL (ref 13.0–17.1)
MONO#: 1.6 10*3/uL — ABNORMAL HIGH (ref 0.1–0.9)
NEUT#: 2.1 10*3/uL (ref 1.5–6.5)
RDW: 16.1 % — ABNORMAL HIGH (ref 11.0–14.6)
lymph#: 1.9 10*3/uL (ref 0.9–3.3)

## 2010-07-08 LAB — MORPHOLOGY

## 2010-07-08 LAB — CHCC SMEAR

## 2010-07-11 ENCOUNTER — Ambulatory Visit (INDEPENDENT_AMBULATORY_CARE_PROVIDER_SITE_OTHER): Payer: Medicare Other | Admitting: Internal Medicine

## 2010-07-11 ENCOUNTER — Encounter (HOSPITAL_COMMUNITY): Payer: Medicare Other

## 2010-07-11 ENCOUNTER — Ambulatory Visit (HOSPITAL_COMMUNITY): Payer: Medicare Other

## 2010-07-11 ENCOUNTER — Encounter: Payer: Self-pay | Admitting: Internal Medicine

## 2010-07-11 VITALS — BP 128/60 | HR 63 | Temp 97.5°F | Ht 70.0 in | Wt 218.8 lb

## 2010-07-11 DIAGNOSIS — L03113 Cellulitis of right upper limb: Secondary | ICD-10-CM

## 2010-07-11 DIAGNOSIS — E119 Type 2 diabetes mellitus without complications: Secondary | ICD-10-CM

## 2010-07-11 DIAGNOSIS — IMO0002 Reserved for concepts with insufficient information to code with codable children: Secondary | ICD-10-CM

## 2010-07-11 DIAGNOSIS — I10 Essential (primary) hypertension: Secondary | ICD-10-CM

## 2010-07-11 MED ORDER — SULFAMETHOXAZOLE-TMP DS 800-160 MG PO TABS: 1.0000 | ORAL_TABLET | Freq: Two times a day (BID) | ORAL | Status: DC

## 2010-07-11 NOTE — Progress Notes (Signed)
Subjective:    Patient ID: [name], male    DOB: 1927-12-01, 77 y.o.   MRN: [id number]  HPI Here for follow up - reviewed chronic med issues:  DM2 - off all meds since 01/2010 - prev on starlix for years - a1c 5.6 preop 01/2010 - does not check cbg at home - no signs or symptoms hyper/hypoglycemia  OSA - follows with pulm/sleep specialist for same - on CPAP, no falling asleep or other sleep problems  prostate ca- s/p resection 1993 - follows with uro q6-19mo- requests psa to send to uro ([facility]) - no voiding issues or change  depression - exac by illness and hosp early 2012 - reports compliance with ongoing medical treatment and no changes in medication dose or frequency. denies adverse side effects related to current therapy.   Past Medical History  Diagnosis Date  . Adenomatous colon polyp 03/1993  . Hyperlipidemia   . Hypertension   . Arthritis   . Prostate cancer   . Depression   . OSA (obstructive sleep apnea)   . Hearing loss   . Diabetes mellitus, type 2   . Atrial fibrillation     not anticoag candidate due to decond and falls  . Legally blind 1987  . AORTIC STENOSIS     s/p AVR 01/2010  . CLOSTRIDIUM DIFFICILE COLITIS 12/30/2009  . CORONARY ARTERY DISEASE     s/p CABG 01/28/10  . DEGENERATIVE DISC DISEASE 11/30/2009  . DIABETES MELLITUS, TYPE II 04/04/2010  . GERD 04/29/2008  . HIP REPLACEMENT, RIGHT, HX OF 2006  . HYPERLIPIDEMIA 04/04/2010  . HYPERTENSION 04/04/2010  . HYPERTROPHY PROSTATE W/UR OBST & OTH LUTS   . MRSA infection 04/2010  . Gout   . THROMBOCYTOPENIA     Review of Systems  Constitutional: Positive for fatigue. Negative for fever and unexpected weight change.  Respiratory: Negative for shortness of breath.   Cardiovascular: Negative for chest pain.  Neurological: Negative for headaches.       Objective:   Physical Exam BP 128/60  Pulse 63  Temp(Src) 97.5 F (36.4 C) (Oral)  Ht 5\' 10"  (1.778 m)  Wt 218 lb 12.8 oz (99.247 kg)  BMI 31.39  kg/m2  SpO2 97% Wt Readings from Last 3 Encounters:  07/11/10 218 lb 12.8 oz (99.247 kg)  05/03/10 210 lb 4 oz (95.369 kg)  05/02/10 212 lb 6.4 oz (96.344 kg)    Physical Exam  Constitutional:  oriented to person, place, and time. appears well-developed and well-nourished. No distress. Wife at side Neck: Normal range of motion. Neck supple. No JVD present. No thyromegaly present.  Cardiovascular: Normal rate, regular rhythm and normal heart sounds.  No murmur heard. no edema Pulmonary/Chest: Effort normal and breath sounds normal. No respiratory distress. no wheezes.  Neurological: he is alert and oriented to person, place, and time. No cranial nerve deficit. Coordination normal.  Skin: Skin is warm and dry.  Small wound/scan with min surrounding erythema on R forearm, extensor surface Psychiatric: he has a normal mood and affect. behavior is normal. Judgment and thought content normal.   Lab Results  Component Value Date   WBC 10.1 02/14/2010   HGB 14.6 07/08/2010   HCT 42.1 07/08/2010   PLT 88* 07/08/2010   ALT 16 07/08/2010   ALT 16 07/08/2010   AST 18 07/08/2010   AST 18 07/08/2010   NA 141 07/08/2010   NA 141 07/08/2010   K 4.5 07/08/2010   K 4.5 07/08/2010   CL  103 07/08/2010   CL 103 07/08/2010   CREATININE 1.02 07/08/2010   CREATININE 1.02 07/08/2010   BUN 17 07/08/2010   BUN 17 07/08/2010   CO2 29 07/08/2010   CO2 29 07/08/2010   TSH 3.929 02/09/2010   PSA 0.21 04/04/2010   INR 1.52* 01/28/2010   HGBA1C 5.2 04/04/2010        Assessment & Plan:  See problem list. Medications and labs reviewed today.  Cellulitis RUE - hx MRSA - small wound/scab on R forearm with early cellulitis - began as pustule per spouse as LLE MRSA wound did - intol of doxy 04/2010, will tx 7 d septra (tol same despite hallucinations) and advised local cleansing - follow up ID if continued problems

## 2010-07-11 NOTE — Patient Instructions (Signed)
It was good to see you today. We have reviewed your prior records including labs and tests today Septra for arm skin infection - Your prescription(s) have been submitted to your pharmacy. Please take as directed and contact our office if you believe you are having problem(s) with the medication(s). Please schedule followup in August for physical and labs, call sooner if problems.

## 2010-07-11 NOTE — Assessment & Plan Note (Signed)
Diet controlled Check a1c now and advised attn to weight/diet as ongoing Lab Results  Component Value Date   HGBA1C 5.2 04/04/2010

## 2010-07-12 ENCOUNTER — Encounter: Payer: Self-pay | Admitting: Internal Medicine

## 2010-07-12 NOTE — Assessment & Plan Note (Signed)
The current medical regimen is effective;  continue present plan and medications.  BP Readings from Last 3 Encounters:  07/11/10 128/60  05/03/10 168/75  05/02/10 124/66

## 2010-07-13 ENCOUNTER — Encounter (HOSPITAL_COMMUNITY): Payer: Medicare Other

## 2010-07-15 ENCOUNTER — Encounter (HOSPITAL_COMMUNITY): Payer: Medicare Other

## 2010-07-18 ENCOUNTER — Encounter (HOSPITAL_COMMUNITY): Payer: Medicare Other

## 2010-07-20 ENCOUNTER — Encounter (HOSPITAL_COMMUNITY): Payer: Medicare Other

## 2010-07-22 ENCOUNTER — Encounter (HOSPITAL_COMMUNITY): Payer: Medicare Other

## 2010-07-25 ENCOUNTER — Encounter (HOSPITAL_COMMUNITY): Payer: Medicare Other | Attending: Cardiovascular Disease

## 2010-07-25 ENCOUNTER — Encounter (HOSPITAL_COMMUNITY): Payer: Medicare Other

## 2010-07-25 DIAGNOSIS — H548 Legal blindness, as defined in USA: Secondary | ICD-10-CM | POA: Insufficient documentation

## 2010-07-25 DIAGNOSIS — Z954 Presence of other heart-valve replacement: Secondary | ICD-10-CM | POA: Insufficient documentation

## 2010-07-25 DIAGNOSIS — I10 Essential (primary) hypertension: Secondary | ICD-10-CM | POA: Insufficient documentation

## 2010-07-25 DIAGNOSIS — Z5189 Encounter for other specified aftercare: Secondary | ICD-10-CM | POA: Insufficient documentation

## 2010-07-25 DIAGNOSIS — G4733 Obstructive sleep apnea (adult) (pediatric): Secondary | ICD-10-CM | POA: Insufficient documentation

## 2010-07-25 DIAGNOSIS — I359 Nonrheumatic aortic valve disorder, unspecified: Secondary | ICD-10-CM | POA: Insufficient documentation

## 2010-07-25 DIAGNOSIS — Z7982 Long term (current) use of aspirin: Secondary | ICD-10-CM | POA: Insufficient documentation

## 2010-07-25 DIAGNOSIS — Z87891 Personal history of nicotine dependence: Secondary | ICD-10-CM | POA: Insufficient documentation

## 2010-07-25 DIAGNOSIS — I739 Peripheral vascular disease, unspecified: Secondary | ICD-10-CM | POA: Insufficient documentation

## 2010-07-25 DIAGNOSIS — Z951 Presence of aortocoronary bypass graft: Secondary | ICD-10-CM | POA: Insufficient documentation

## 2010-07-25 DIAGNOSIS — E785 Hyperlipidemia, unspecified: Secondary | ICD-10-CM | POA: Insufficient documentation

## 2010-07-25 DIAGNOSIS — E119 Type 2 diabetes mellitus without complications: Secondary | ICD-10-CM | POA: Insufficient documentation

## 2010-07-25 DIAGNOSIS — N289 Disorder of kidney and ureter, unspecified: Secondary | ICD-10-CM | POA: Insufficient documentation

## 2010-07-25 DIAGNOSIS — I4891 Unspecified atrial fibrillation: Secondary | ICD-10-CM | POA: Insufficient documentation

## 2010-07-25 DIAGNOSIS — R5381 Other malaise: Secondary | ICD-10-CM | POA: Insufficient documentation

## 2010-07-25 DIAGNOSIS — K219 Gastro-esophageal reflux disease without esophagitis: Secondary | ICD-10-CM | POA: Insufficient documentation

## 2010-07-25 DIAGNOSIS — I2510 Atherosclerotic heart disease of native coronary artery without angina pectoris: Secondary | ICD-10-CM | POA: Insufficient documentation

## 2010-07-27 ENCOUNTER — Encounter (HOSPITAL_COMMUNITY): Payer: Medicare Other

## 2010-07-29 ENCOUNTER — Encounter (HOSPITAL_COMMUNITY): Payer: Medicare Other

## 2010-08-01 ENCOUNTER — Encounter (HOSPITAL_COMMUNITY): Payer: Medicare Other

## 2010-08-03 ENCOUNTER — Encounter (HOSPITAL_COMMUNITY): Payer: Medicare Other

## 2010-08-05 ENCOUNTER — Encounter (HOSPITAL_COMMUNITY): Payer: Medicare Other

## 2010-08-05 ENCOUNTER — Telehealth: Payer: Self-pay | Admitting: *Deleted

## 2010-08-05 NOTE — Telephone Encounter (Signed)
Wife concerned about large boil on the pt's buttocks.  Pt has had heart valve replacement in the past and has been treated by Dr. Orvan Falconer in the past for infection and resultin C. Diff.  Wife requesting appt for today.  RN advised that the Center does not have a MD in on Fridays.  RN advised that the pt be taken to the ED at either [facility] or [facility].  Wife verbalized understanding. [name], RN.

## 2010-08-22 ENCOUNTER — Encounter (HOSPITAL_COMMUNITY): Payer: Self-pay | Attending: Cardiovascular Disease

## 2010-08-22 DIAGNOSIS — I359 Nonrheumatic aortic valve disorder, unspecified: Secondary | ICD-10-CM | POA: Insufficient documentation

## 2010-08-22 DIAGNOSIS — E785 Hyperlipidemia, unspecified: Secondary | ICD-10-CM | POA: Insufficient documentation

## 2010-08-22 DIAGNOSIS — I4891 Unspecified atrial fibrillation: Secondary | ICD-10-CM | POA: Insufficient documentation

## 2010-08-22 DIAGNOSIS — R5381 Other malaise: Secondary | ICD-10-CM | POA: Insufficient documentation

## 2010-08-22 DIAGNOSIS — Z951 Presence of aortocoronary bypass graft: Secondary | ICD-10-CM | POA: Insufficient documentation

## 2010-08-22 DIAGNOSIS — I739 Peripheral vascular disease, unspecified: Secondary | ICD-10-CM | POA: Insufficient documentation

## 2010-08-22 DIAGNOSIS — E119 Type 2 diabetes mellitus without complications: Secondary | ICD-10-CM | POA: Insufficient documentation

## 2010-08-22 DIAGNOSIS — Z7982 Long term (current) use of aspirin: Secondary | ICD-10-CM | POA: Insufficient documentation

## 2010-08-22 DIAGNOSIS — Z87891 Personal history of nicotine dependence: Secondary | ICD-10-CM | POA: Insufficient documentation

## 2010-08-22 DIAGNOSIS — I2510 Atherosclerotic heart disease of native coronary artery without angina pectoris: Secondary | ICD-10-CM | POA: Insufficient documentation

## 2010-08-22 DIAGNOSIS — N289 Disorder of kidney and ureter, unspecified: Secondary | ICD-10-CM | POA: Insufficient documentation

## 2010-08-22 DIAGNOSIS — K219 Gastro-esophageal reflux disease without esophagitis: Secondary | ICD-10-CM | POA: Insufficient documentation

## 2010-08-22 DIAGNOSIS — H548 Legal blindness, as defined in USA: Secondary | ICD-10-CM | POA: Insufficient documentation

## 2010-08-22 DIAGNOSIS — Z5189 Encounter for other specified aftercare: Secondary | ICD-10-CM | POA: Insufficient documentation

## 2010-08-22 DIAGNOSIS — Z954 Presence of other heart-valve replacement: Secondary | ICD-10-CM | POA: Insufficient documentation

## 2010-08-22 DIAGNOSIS — I10 Essential (primary) hypertension: Secondary | ICD-10-CM | POA: Insufficient documentation

## 2010-08-22 DIAGNOSIS — G4733 Obstructive sleep apnea (adult) (pediatric): Secondary | ICD-10-CM | POA: Insufficient documentation

## 2010-08-24 ENCOUNTER — Encounter (HOSPITAL_COMMUNITY): Payer: Self-pay | Attending: Cardiovascular Disease

## 2010-08-24 DIAGNOSIS — E119 Type 2 diabetes mellitus without complications: Secondary | ICD-10-CM | POA: Insufficient documentation

## 2010-08-24 DIAGNOSIS — I10 Essential (primary) hypertension: Secondary | ICD-10-CM | POA: Insufficient documentation

## 2010-08-24 DIAGNOSIS — E785 Hyperlipidemia, unspecified: Secondary | ICD-10-CM | POA: Insufficient documentation

## 2010-08-24 DIAGNOSIS — Z5189 Encounter for other specified aftercare: Secondary | ICD-10-CM | POA: Insufficient documentation

## 2010-08-24 DIAGNOSIS — Z87891 Personal history of nicotine dependence: Secondary | ICD-10-CM | POA: Insufficient documentation

## 2010-08-24 DIAGNOSIS — H548 Legal blindness, as defined in USA: Secondary | ICD-10-CM | POA: Insufficient documentation

## 2010-08-24 DIAGNOSIS — N289 Disorder of kidney and ureter, unspecified: Secondary | ICD-10-CM | POA: Insufficient documentation

## 2010-08-24 DIAGNOSIS — I739 Peripheral vascular disease, unspecified: Secondary | ICD-10-CM | POA: Insufficient documentation

## 2010-08-24 DIAGNOSIS — G4733 Obstructive sleep apnea (adult) (pediatric): Secondary | ICD-10-CM | POA: Insufficient documentation

## 2010-08-24 DIAGNOSIS — I4891 Unspecified atrial fibrillation: Secondary | ICD-10-CM | POA: Insufficient documentation

## 2010-08-24 DIAGNOSIS — Z951 Presence of aortocoronary bypass graft: Secondary | ICD-10-CM | POA: Insufficient documentation

## 2010-08-24 DIAGNOSIS — R5381 Other malaise: Secondary | ICD-10-CM | POA: Insufficient documentation

## 2010-08-24 DIAGNOSIS — K219 Gastro-esophageal reflux disease without esophagitis: Secondary | ICD-10-CM | POA: Insufficient documentation

## 2010-08-24 DIAGNOSIS — I2510 Atherosclerotic heart disease of native coronary artery without angina pectoris: Secondary | ICD-10-CM | POA: Insufficient documentation

## 2010-08-24 DIAGNOSIS — Z954 Presence of other heart-valve replacement: Secondary | ICD-10-CM | POA: Insufficient documentation

## 2010-08-24 DIAGNOSIS — I359 Nonrheumatic aortic valve disorder, unspecified: Secondary | ICD-10-CM | POA: Insufficient documentation

## 2010-08-24 DIAGNOSIS — Z7982 Long term (current) use of aspirin: Secondary | ICD-10-CM | POA: Insufficient documentation

## 2010-08-26 ENCOUNTER — Encounter (HOSPITAL_COMMUNITY): Payer: Self-pay

## 2010-08-29 ENCOUNTER — Other Ambulatory Visit: Payer: Self-pay | Admitting: *Deleted

## 2010-08-29 ENCOUNTER — Telehealth: Payer: Self-pay

## 2010-08-29 ENCOUNTER — Encounter (HOSPITAL_COMMUNITY): Payer: Self-pay

## 2010-08-29 MED ORDER — ROPINIROLE HCL 0.25 MG PO TABS: 0.2500 mg | ORAL_TABLET | Freq: Three times a day (TID) | ORAL | Status: DC

## 2010-08-29 NOTE — Telephone Encounter (Signed)
You are welcome to check with pt/wife but my med list shows requip 0.25mg  tid - i defer to wife if home dose is different that our list - please correct if needed - thanks

## 2010-08-29 NOTE — Telephone Encounter (Signed)
Pharmacy called requesting clarification on pt's Requip E-Rx sent in today, 1 po tid or 3 tabs po qhs? Please advise.

## 2010-08-30 NOTE — Telephone Encounter (Signed)
Left message on machine for pt to return my call  

## 2010-08-31 ENCOUNTER — Encounter (HOSPITAL_COMMUNITY): Payer: Self-pay

## 2010-08-31 ENCOUNTER — Other Ambulatory Visit: Payer: Self-pay

## 2010-08-31 MED ORDER — ROPINIROLE HCL 0.25 MG PO TABS: 0.7500 mg | ORAL_TABLET | Freq: Every day | ORAL | Status: DC

## 2010-08-31 NOTE — Telephone Encounter (Signed)
Left several messages on machine for pt to return my call, closing phone note until further contact from pt.

## 2010-09-02 ENCOUNTER — Encounter (HOSPITAL_COMMUNITY): Payer: Self-pay

## 2010-09-05 ENCOUNTER — Encounter (HOSPITAL_COMMUNITY): Payer: Self-pay

## 2010-09-07 ENCOUNTER — Encounter (HOSPITAL_COMMUNITY): Payer: Self-pay

## 2010-09-08 ENCOUNTER — Ambulatory Visit (INDEPENDENT_AMBULATORY_CARE_PROVIDER_SITE_OTHER): Payer: Medicare Other | Admitting: Pulmonary Disease

## 2010-09-08 ENCOUNTER — Encounter: Payer: Self-pay | Admitting: Pulmonary Disease

## 2010-09-08 ENCOUNTER — Other Ambulatory Visit: Payer: Medicare Other

## 2010-09-08 VITALS — BP 128/68 | HR 67 | Temp 98.6°F | Ht 69.0 in | Wt 218.6 lb

## 2010-09-08 DIAGNOSIS — G4733 Obstructive sleep apnea (adult) (pediatric): Secondary | ICD-10-CM

## 2010-09-08 NOTE — Patient Instructions (Signed)
We will check download from your cpap machine at 17 cm - turn in the card We will check your oxygen levels during sleep If these are ok, your tiredness may be side effect of medications

## 2010-09-08 NOTE — Progress Notes (Signed)
  Subjective:    Patient ID: [name], male    DOB: 1927-07-21, 77 y.o.   MRN: [id number]  HPI 83/M for FU of obstructive sleep apnea  Oct '11 >> staph sepsis  Nov 11 >> c .diff colitis, low plts  Underwent CABG x3 Pig AVR in jan '12, started cardiac rehab today  He had remote sleep study in [place], another  Study in 2005 - now in need of new supplier for home equipment  On VPAP auto ? 12 cm (got this machine 2 yrs ago) , mirage full face mask, pr increased to 12 cm in jan'12 after download check  Referred to review, esp in setting of other med/hx changes since last eval for OSA - he also needs CPAP supplier  ESS 18 , bedtime 10p, minimal latency, Nocturia + since prostate sx, oob at 0930 , feels refreshed , no headaches, daytime naps +  Download 9/17-05/02/10 shows avg pr 15 cm, AHI 14.4 residual with predominant hypopneas, some leak & excellent compliance >> Pr increased to 17 cm  09/08/2010 Pt states is using machine every night approx 10 hours C/o increased tiredness since his cardiac problems, on more meds now  There is no history suggestive of cataplexy, sleep paralysis or parasomnias     Review of Systems Pt denies any significant  nasal congestion or excess secretions, fever, chills, sweats, unintended wt loss, pleuritic or exertional cp, orthopnea pnd or leg swelling.  Pt also denies any obvious fluctuation in symptoms with weather or environmental change or other alleviating or aggravating factors.    Pt denies any increase in rescue therapy over baseline, denies waking up needing it or having early am exacerbations or coughing/wheezing/ or dyspnea      Objective:   Physical Exam  Gen. Pleasant, well-nourished, in no distress ENT - no lesions, no post nasal drip Neck: No JVD, no thyromegaly, no carotid bruits Lungs: no use of accessory muscles, no dullness to percussion, clear without rales or rhonchi  Cardiovascular: Rhythm regular, heart sounds  normal, no  murmurs or gallops, no peripheral edema Musculoskeletal: No deformities, no cyanosis or clubbing        Assessment & Plan:

## 2010-09-08 NOTE — Assessment & Plan Note (Signed)
Feel CPAP adequate at 17 cm, good compliance per report Wil chk donwload  Also  Noct oximetry for desaturations Weight loss encouraged, compliance with goal of at least 4-6 hrs every night is the expectation. Advised against medications with sedative side effects Cautioned against driving when sleepy - understanding that sleepiness will vary on a day to day basis

## 2010-09-09 ENCOUNTER — Encounter (HOSPITAL_COMMUNITY): Payer: Self-pay

## 2010-09-12 ENCOUNTER — Encounter (HOSPITAL_COMMUNITY): Payer: Self-pay

## 2010-09-13 ENCOUNTER — Ambulatory Visit (INDEPENDENT_AMBULATORY_CARE_PROVIDER_SITE_OTHER): Payer: Medicare Other | Admitting: Internal Medicine

## 2010-09-13 ENCOUNTER — Encounter: Payer: Self-pay | Admitting: Internal Medicine

## 2010-09-13 ENCOUNTER — Other Ambulatory Visit (INDEPENDENT_AMBULATORY_CARE_PROVIDER_SITE_OTHER): Payer: Medicare Other

## 2010-09-13 VITALS — BP 142/60 | HR 61 | Temp 98.2°F | Ht 70.0 in | Wt 218.8 lb

## 2010-09-13 DIAGNOSIS — Z2911 Encounter for prophylactic immunotherapy for respiratory syncytial virus (RSV): Secondary | ICD-10-CM

## 2010-09-13 DIAGNOSIS — R5381 Other malaise: Secondary | ICD-10-CM

## 2010-09-13 DIAGNOSIS — Z23 Encounter for immunization: Secondary | ICD-10-CM

## 2010-09-13 DIAGNOSIS — Z136 Encounter for screening for cardiovascular disorders: Secondary | ICD-10-CM

## 2010-09-13 DIAGNOSIS — Z0000 Encounter for general adult medical examination without abnormal findings: Secondary | ICD-10-CM

## 2010-09-13 DIAGNOSIS — C61 Malignant neoplasm of prostate: Secondary | ICD-10-CM

## 2010-09-13 DIAGNOSIS — R5383 Other fatigue: Secondary | ICD-10-CM

## 2010-09-13 DIAGNOSIS — E785 Hyperlipidemia, unspecified: Secondary | ICD-10-CM

## 2010-09-13 DIAGNOSIS — E119 Type 2 diabetes mellitus without complications: Secondary | ICD-10-CM

## 2010-09-13 LAB — HEPATIC FUNCTION PANEL
ALT: 32 U/L (ref 0–53)
Albumin: 4.6 g/dL (ref 3.5–5.2)
Alkaline Phosphatase: 65 U/L (ref 39–117)
Bilirubin, Direct: 0.2 mg/dL (ref 0.0–0.3)
Total Protein: 6.9 g/dL (ref 6.0–8.3)

## 2010-09-13 LAB — LIPID PANEL
Cholesterol: 138 mg/dL (ref 0–200)
HDL: 57.7 mg/dL (ref >39.00)
LDL Cholesterol: 66 mg/dL (ref 0–99)
Total CHOL/HDL Ratio: 2
Triglycerides: 74 mg/dL (ref 0.0–149.0)
VLDL: 14.8 mg/dL (ref 0.0–40.0)

## 2010-09-13 LAB — CBC WITH DIFFERENTIAL/PLATELET
Basophils Relative: 0.2 % (ref 0.0–3.0)
Eosinophils Relative: 0.6 % (ref 0.0–5.0)
Lymphocytes Relative: 10.4 % — ABNORMAL LOW (ref 12.0–46.0)
Monocytes Absolute: 2.7 10*3/uL — ABNORMAL HIGH (ref 0.1–1.0)
Monocytes Relative: 30.1 % — ABNORMAL HIGH (ref 3.0–12.0)
Neutrophils Relative %: 58.7 % (ref 43.0–77.0)
Platelets: 91 10*3/uL — ABNORMAL LOW (ref 150.0–400.0)
RBC: 4.77 Mil/uL (ref 4.22–5.81)
WBC: 9 10*3/uL (ref 4.5–10.5)

## 2010-09-13 LAB — BASIC METABOLIC PANEL
BUN: 19 mg/dL (ref 6–23)
Calcium: 9.5 mg/dL (ref 8.4–10.5)
Chloride: 104 mEq/L (ref 96–112)
Creatinine, Ser: 0.9 mg/dL (ref 0.4–1.5)
GFR: 83.87 mL/min (ref >60.00)

## 2010-09-13 LAB — HEMOGLOBIN A1C: Hgb A1c MFr Bld: 5.6 % (ref 4.6–6.5)

## 2010-09-13 MED ORDER — ROPINIROLE HCL 0.25 MG PO TABS: 0.7500 mg | ORAL_TABLET | Freq: Every day | ORAL | Status: DC

## 2010-09-13 MED ORDER — ALLOPURINOL 300 MG PO TABS: 300.0000 mg | ORAL_TABLET | Freq: Every day | ORAL | Status: DC

## 2010-09-13 MED ORDER — METOPROLOL TARTRATE 25 MG PO TABS: 25.0000 mg | ORAL_TABLET | Freq: Two times a day (BID) | ORAL | Status: DC

## 2010-09-13 MED ORDER — POTASSIUM CHLORIDE ER 10 MEQ PO TBCR: 10.0000 meq | EXTENDED_RELEASE_TABLET | Freq: Every day | ORAL | Status: DC

## 2010-09-13 MED ORDER — DILTIAZEM HCL ER COATED BEADS 360 MG PO TB24: 360.0000 mg | ORAL_TABLET | Freq: Every day | ORAL | Status: DC

## 2010-09-13 MED ORDER — OMEPRAZOLE 20 MG PO CPDR: 20.0000 mg | DELAYED_RELEASE_CAPSULE | Freq: Every day | ORAL | Status: DC

## 2010-09-13 MED ORDER — FUROSEMIDE 40 MG PO TABS: 20.0000 mg | ORAL_TABLET | Freq: Every day | ORAL | Status: DC

## 2010-09-13 MED ORDER — RANITIDINE HCL 75 MG PO TABS: 75.0000 mg | ORAL_TABLET | ORAL | Status: DC

## 2010-09-13 MED ORDER — ESCITALOPRAM OXALATE 20 MG PO TABS: 20.0000 mg | ORAL_TABLET | Freq: Every day | ORAL | Status: DC

## 2010-09-13 MED ORDER — ROSUVASTATIN CALCIUM 10 MG PO TABS: 20.0000 mg | ORAL_TABLET | Freq: Every day | ORAL | Status: DC

## 2010-09-13 NOTE — Patient Instructions (Addendum)
It was good to see you today. We have reviewed your prior records including labs and tests today; EKG and exam look good Try GasX or Beano for gas as discussed Test(s) ordered today. Your results will be called to you after review (48-72hours after test completion). If any changes need to be made, you will be notified at that time. Medications reviewed, no changes at this time. Refill on medication(s) as discussed today. Shingles vaccine done today as discussed -  Please schedule follow up in 6 months, call sooner if problems.

## 2010-09-13 NOTE — Assessment & Plan Note (Signed)
Diet controlled Check a1c now and advised attn to weight/diet as ongoing Lab Results  Component Value Date   HGBA1C 5.2 04/04/2010

## 2010-09-13 NOTE — Progress Notes (Signed)
Subjective:    Patient ID: [name], male    DOB: 1927/07/28, 77 y.o.   MRN: [id number]  HPI   Here for medicare wellness  Diet: heart healthy, diabetic Physical activity: sedentary Depression/mood screen: negative Hearing: intact to whispered voice Visual acuity: grossly normal, performs annual eye exam  ADLs: capable Fall risk: low-mod Home safety: fair Cognitive evaluation: intact to orientation, naming, recall and repetition EOL planning: adv directives, full code/ I agree  I have personally reviewed and have noted 1. The patient's medical and social history 2. Their use of alcohol, tobacco or illicit drugs 3. Their current medications and supplements 4. The patient's functional ability including ADL's, fall risks, home safety risks and hearing or visual impairment. 5. Diet and physical activities 6. Evidence for depression or mood disorders  Also reviewed chronic med issues:  DM2 - off all meds since 01/2010 - prev on starlix for years - a1c 5.6 preop 01/2010 - does not check cbg at home - no signs or symptoms hyper/hypoglycemia  OSA - follows with pulm/sleep specialist for same - on CPAP, no falling asleep or other sleep problems  prostate ca- s/p resection 1993 - follows with uro q6-79mo- ([facility]) - no voiding issues or change in flow  depression - exac by illness and hosp early 2012 - reports compliance with ongoing medical treatment and no changes in medication dose or frequency. denies adverse side effects related to current therapy.   Past Medical History  Diagnosis Date  . Adenomatous colon polyp 03/1993  . Prostate cancer   . Depression   . OSA (obstructive sleep apnea)   . Hearing loss   . Atrial fibrillation     not anticoag candidate due to decond and falls  . Legally blind 1987  . AORTIC STENOSIS     s/p AVR 01/2010  . CLOSTRIDIUM DIFFICILE COLITIS 12/30/2009  . CORONARY ARTERY DISEASE     s/p CABG 01/28/10  . HIP REPLACEMENT, RIGHT, HX OF 2006   . HYPERTROPHY PROSTATE W/UR OBST & OTH LUTS   . MRSA infection 04/2010  . Gout   . THROMBOCYTOPENIA   . Arthritis   . DEGENERATIVE DISC DISEASE   . Diabetes mellitus, type 2   . GERD   . Hyperlipidemia   . Hypertension    Family History  Problem Relation Age of Onset  . Heart disease Mother   . Lung cancer Mother   . Hypertension Brother   . Alcohol abuse Father   . Sudden death Father   . Aneurysm Father   . Heart attack Brother   . Gout Brother   . Arthritis Brother   . Arthritis Other   . Gout Other    History  Substance Use Topics  . Smoking status: Former Smoker -- 1.0 packs/day for 2 years    Types: Cigars    Quit date: 07/01/2009  . 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 Alcohol Use: No    Review of Systems  Constitutional: Positive for fatigue. Negative for fever and unexpected weight change.  Respiratory: Negative for shortness of breath.   Cardiovascular: Negative for chest pain.  Neurological: Negative for headaches.  No other specific complaints in a complete review of systems (except as listed in HPI above).      Objective:   Physical Exam  BP 142/60  Pulse 61  Temp(Src) 98.2 F (36.8 C) (Oral)  Ht 5\' 10"  (1.778 m)  Wt  218 lb 12.8 oz (99.247 kg)  BMI 31.39 kg/m2  SpO2 97% Wt Readings from Last 3 Encounters:  09/13/10 218 lb 12.8 oz (99.247 kg)  09/08/10 218 lb 9.6 oz (99.156 kg)  07/11/10 218 lb 12.8 oz (99.247 kg)   Constitutional:  oriented to person, place, and time. appears well-developed and well-nourished. No distress. Wife at side Eyes: PERRL, EOMI, no icterus/conjunctivitis Neck: Normal range of motion. Neck supple. No JVD present. No thyromegaly present.  Cardiovascular: Normal rate, regular rhythm and normal heart sounds.  No murmur heard. no edema BLE Pulmonary/Chest: Effort normal and breath sounds normal. No respiratory distress. no wheezes.  Abd: SNTND, +BS, no  mass GU: defer to uro Neurological: he is alert and oriented to person, place, and time. No cranial nerve deficit. Coordination normal.  Skin: Skin is warm and dry.  Small wound/scan with min surrounding erythema on R forearm, extensor surface Psychiatric: he has a normal mood and affect. behavior is normal. Judgment and thought content normal.   Lab Results  Component Value Date   WBC 10.1 02/14/2010   HGB 14.6 07/08/2010   HCT 42.1 07/08/2010   PLT 88* 07/08/2010   ALT 16 07/08/2010   ALT 16 07/08/2010   AST 18 07/08/2010   AST 18 07/08/2010   NA 141 07/08/2010   NA 141 07/08/2010   K 4.5 07/08/2010   K 4.5 07/08/2010   CL 103 07/08/2010   CL 103 07/08/2010   CREATININE 1.02 07/08/2010   CREATININE 1.02 07/08/2010   BUN 17 07/08/2010   BUN 17 07/08/2010   CO2 29 07/08/2010   CO2 29 07/08/2010   TSH 3.929 02/09/2010   PSA 0.21 04/04/2010   INR 1.52* 01/28/2010   HGBA1C 5.2 04/04/2010   EKG: sinus @ 69 bpm, no ischemic changes     Assessment & Plan:   AWV - v70.0 - Today patient counseled on age appropriate routine health concerns for screening and prevention, each reviewed and up to date or declined. Immunizations reviewed and up to date or declined. Labs ordered/ECG reviewed. Risk factors for depression reviewed and negative/treated. Hearing function and visual acuity are intact. ADLs screened and addressed as needed. Functional ability and level of safety reviewed and appropriate. Education, counseling and referrals performed based on assessed risks today. Patient provided with a copy of personalized plan for preventive services.  Also see problem list. Medications and labs reviewed today.

## 2010-09-13 NOTE — Assessment & Plan Note (Signed)
On statin due to hx CAD - check lipids now

## 2010-09-14 ENCOUNTER — Encounter (HOSPITAL_COMMUNITY): Payer: Self-pay

## 2010-09-15 ENCOUNTER — Telehealth: Payer: Self-pay | Admitting: Pulmonary Disease

## 2010-09-15 NOTE — Telephone Encounter (Signed)
Reviewed ONO on CPAP >> no desaturation Does not need O2

## 2010-09-15 NOTE — Telephone Encounter (Signed)
I informed pt spouse of RA's findings and recommendations. Pt verbalized understanding. Julaine Hua, CMA

## 2010-09-16 ENCOUNTER — Encounter (HOSPITAL_COMMUNITY): Payer: Self-pay

## 2010-09-19 ENCOUNTER — Encounter (HOSPITAL_COMMUNITY): Payer: Self-pay

## 2010-09-20 ENCOUNTER — Encounter: Payer: Self-pay | Admitting: Pulmonary Disease

## 2010-09-21 ENCOUNTER — Encounter (HOSPITAL_COMMUNITY): Payer: Self-pay

## 2010-09-23 ENCOUNTER — Encounter (HOSPITAL_COMMUNITY): Payer: Self-pay

## 2010-09-26 ENCOUNTER — Encounter (HOSPITAL_COMMUNITY): Payer: Self-pay | Attending: Cardiovascular Disease

## 2010-09-26 DIAGNOSIS — I4891 Unspecified atrial fibrillation: Secondary | ICD-10-CM | POA: Insufficient documentation

## 2010-09-26 DIAGNOSIS — N289 Disorder of kidney and ureter, unspecified: Secondary | ICD-10-CM | POA: Insufficient documentation

## 2010-09-26 DIAGNOSIS — K219 Gastro-esophageal reflux disease without esophagitis: Secondary | ICD-10-CM | POA: Insufficient documentation

## 2010-09-26 DIAGNOSIS — I359 Nonrheumatic aortic valve disorder, unspecified: Secondary | ICD-10-CM | POA: Insufficient documentation

## 2010-09-26 DIAGNOSIS — E119 Type 2 diabetes mellitus without complications: Secondary | ICD-10-CM | POA: Insufficient documentation

## 2010-09-26 DIAGNOSIS — Z5189 Encounter for other specified aftercare: Secondary | ICD-10-CM | POA: Insufficient documentation

## 2010-09-26 DIAGNOSIS — H548 Legal blindness, as defined in USA: Secondary | ICD-10-CM | POA: Insufficient documentation

## 2010-09-26 DIAGNOSIS — I739 Peripheral vascular disease, unspecified: Secondary | ICD-10-CM | POA: Insufficient documentation

## 2010-09-26 DIAGNOSIS — E785 Hyperlipidemia, unspecified: Secondary | ICD-10-CM | POA: Insufficient documentation

## 2010-09-26 DIAGNOSIS — Z7982 Long term (current) use of aspirin: Secondary | ICD-10-CM | POA: Insufficient documentation

## 2010-09-26 DIAGNOSIS — Z87891 Personal history of nicotine dependence: Secondary | ICD-10-CM | POA: Insufficient documentation

## 2010-09-26 DIAGNOSIS — G4733 Obstructive sleep apnea (adult) (pediatric): Secondary | ICD-10-CM | POA: Insufficient documentation

## 2010-09-26 DIAGNOSIS — Z954 Presence of other heart-valve replacement: Secondary | ICD-10-CM | POA: Insufficient documentation

## 2010-09-26 DIAGNOSIS — I2510 Atherosclerotic heart disease of native coronary artery without angina pectoris: Secondary | ICD-10-CM | POA: Insufficient documentation

## 2010-09-26 DIAGNOSIS — I10 Essential (primary) hypertension: Secondary | ICD-10-CM | POA: Insufficient documentation

## 2010-09-26 DIAGNOSIS — Z951 Presence of aortocoronary bypass graft: Secondary | ICD-10-CM | POA: Insufficient documentation

## 2010-09-26 DIAGNOSIS — R5381 Other malaise: Secondary | ICD-10-CM | POA: Insufficient documentation

## 2010-09-28 ENCOUNTER — Encounter (HOSPITAL_COMMUNITY): Payer: Self-pay

## 2010-09-30 ENCOUNTER — Encounter (HOSPITAL_COMMUNITY): Payer: Self-pay

## 2010-10-03 ENCOUNTER — Encounter (HOSPITAL_BASED_OUTPATIENT_CLINIC_OR_DEPARTMENT_OTHER): Payer: Medicare Other | Admitting: Oncology

## 2010-10-03 ENCOUNTER — Other Ambulatory Visit: Payer: Self-pay | Admitting: Oncology

## 2010-10-03 ENCOUNTER — Encounter (HOSPITAL_COMMUNITY): Payer: Self-pay

## 2010-10-03 DIAGNOSIS — D72821 Monocytosis (symptomatic): Secondary | ICD-10-CM

## 2010-10-03 DIAGNOSIS — Z8546 Personal history of malignant neoplasm of prostate: Secondary | ICD-10-CM

## 2010-10-03 DIAGNOSIS — I10 Essential (primary) hypertension: Secondary | ICD-10-CM

## 2010-10-03 DIAGNOSIS — E119 Type 2 diabetes mellitus without complications: Secondary | ICD-10-CM

## 2010-10-03 LAB — CBC WITH DIFFERENTIAL/PLATELET
BASO%: 0.1 % (ref 0.0–2.0)
EOS%: 0.1 % (ref 0.0–7.0)
MCH: 31.5 pg (ref 27.2–33.4)
MCHC: 34.9 g/dL (ref 32.0–36.0)
MONO#: 1.9 10*3/uL — ABNORMAL HIGH (ref 0.1–0.9)
RBC: 4.82 10*6/uL (ref 4.20–5.82)
RDW: 14.3 % (ref 11.0–14.6)
WBC: 7.4 10*3/uL (ref 4.0–10.3)
lymph#: 0.9 10*3/uL (ref 0.9–3.3)
nRBC: 0 % (ref 0–0)

## 2010-10-05 ENCOUNTER — Encounter (HOSPITAL_COMMUNITY): Payer: Self-pay

## 2010-10-06 ENCOUNTER — Telehealth: Payer: Self-pay | Admitting: Internal Medicine

## 2010-10-06 NOTE — Telephone Encounter (Signed)
Forwarded to Dr. Leschber for review.  °

## 2010-10-07 ENCOUNTER — Telehealth: Payer: Self-pay | Admitting: Pulmonary Disease

## 2010-10-07 ENCOUNTER — Encounter (HOSPITAL_COMMUNITY): Payer: Self-pay

## 2010-10-07 NOTE — Telephone Encounter (Signed)
Download 8/2 -09/07/10 showed avg pressure 11 cm, avg usage 5 h, minimal leak

## 2010-10-10 ENCOUNTER — Encounter (HOSPITAL_COMMUNITY): Payer: Self-pay

## 2010-10-12 ENCOUNTER — Encounter (HOSPITAL_COMMUNITY): Payer: Self-pay

## 2010-10-12 NOTE — Telephone Encounter (Signed)
I informed pt spouse of RA's findings and recommendations. Pt spouse verbalized understanding.

## 2010-10-14 ENCOUNTER — Encounter: Payer: Self-pay | Admitting: Pulmonary Disease

## 2010-10-14 ENCOUNTER — Encounter (HOSPITAL_COMMUNITY): Payer: Self-pay

## 2010-10-17 ENCOUNTER — Encounter (HOSPITAL_COMMUNITY): Payer: Self-pay

## 2010-10-19 ENCOUNTER — Encounter (HOSPITAL_COMMUNITY): Payer: Self-pay

## 2010-10-21 ENCOUNTER — Encounter (HOSPITAL_COMMUNITY): Payer: Self-pay

## 2010-10-24 ENCOUNTER — Encounter (HOSPITAL_COMMUNITY): Payer: Self-pay | Attending: Cardiovascular Disease

## 2010-10-24 DIAGNOSIS — E119 Type 2 diabetes mellitus without complications: Secondary | ICD-10-CM | POA: Insufficient documentation

## 2010-10-24 DIAGNOSIS — Z951 Presence of aortocoronary bypass graft: Secondary | ICD-10-CM | POA: Insufficient documentation

## 2010-10-24 DIAGNOSIS — Z5189 Encounter for other specified aftercare: Secondary | ICD-10-CM | POA: Insufficient documentation

## 2010-10-24 DIAGNOSIS — I10 Essential (primary) hypertension: Secondary | ICD-10-CM | POA: Insufficient documentation

## 2010-10-24 DIAGNOSIS — I359 Nonrheumatic aortic valve disorder, unspecified: Secondary | ICD-10-CM | POA: Insufficient documentation

## 2010-10-24 DIAGNOSIS — E785 Hyperlipidemia, unspecified: Secondary | ICD-10-CM | POA: Insufficient documentation

## 2010-10-24 DIAGNOSIS — K219 Gastro-esophageal reflux disease without esophagitis: Secondary | ICD-10-CM | POA: Insufficient documentation

## 2010-10-24 DIAGNOSIS — H548 Legal blindness, as defined in USA: Secondary | ICD-10-CM | POA: Insufficient documentation

## 2010-10-24 DIAGNOSIS — Z954 Presence of other heart-valve replacement: Secondary | ICD-10-CM | POA: Insufficient documentation

## 2010-10-24 DIAGNOSIS — N289 Disorder of kidney and ureter, unspecified: Secondary | ICD-10-CM | POA: Insufficient documentation

## 2010-10-24 DIAGNOSIS — R5381 Other malaise: Secondary | ICD-10-CM | POA: Insufficient documentation

## 2010-10-24 DIAGNOSIS — G4733 Obstructive sleep apnea (adult) (pediatric): Secondary | ICD-10-CM | POA: Insufficient documentation

## 2010-10-24 DIAGNOSIS — I4891 Unspecified atrial fibrillation: Secondary | ICD-10-CM | POA: Insufficient documentation

## 2010-10-24 DIAGNOSIS — Z7982 Long term (current) use of aspirin: Secondary | ICD-10-CM | POA: Insufficient documentation

## 2010-10-24 DIAGNOSIS — I739 Peripheral vascular disease, unspecified: Secondary | ICD-10-CM | POA: Insufficient documentation

## 2010-10-24 DIAGNOSIS — I2510 Atherosclerotic heart disease of native coronary artery without angina pectoris: Secondary | ICD-10-CM | POA: Insufficient documentation

## 2010-10-24 DIAGNOSIS — Z87891 Personal history of nicotine dependence: Secondary | ICD-10-CM | POA: Insufficient documentation

## 2010-10-26 ENCOUNTER — Encounter (HOSPITAL_COMMUNITY): Payer: Self-pay

## 2010-10-28 ENCOUNTER — Encounter (HOSPITAL_COMMUNITY): Payer: Self-pay

## 2010-10-31 ENCOUNTER — Encounter (HOSPITAL_COMMUNITY): Payer: Self-pay

## 2010-11-02 ENCOUNTER — Encounter (HOSPITAL_COMMUNITY): Payer: Self-pay

## 2010-11-04 ENCOUNTER — Ambulatory Visit (INDEPENDENT_AMBULATORY_CARE_PROVIDER_SITE_OTHER): Payer: Medicare Other

## 2010-11-04 ENCOUNTER — Encounter (HOSPITAL_COMMUNITY): Payer: Self-pay

## 2010-11-04 DIAGNOSIS — Z23 Encounter for immunization: Secondary | ICD-10-CM

## 2010-11-07 ENCOUNTER — Encounter (HOSPITAL_COMMUNITY): Payer: Self-pay

## 2010-11-09 ENCOUNTER — Encounter (HOSPITAL_COMMUNITY): Payer: Self-pay

## 2010-11-11 ENCOUNTER — Encounter (HOSPITAL_COMMUNITY): Payer: Self-pay

## 2010-11-14 ENCOUNTER — Encounter (HOSPITAL_COMMUNITY): Payer: Self-pay

## 2010-11-16 ENCOUNTER — Encounter (HOSPITAL_COMMUNITY): Payer: Self-pay

## 2010-11-18 ENCOUNTER — Encounter (HOSPITAL_COMMUNITY): Payer: Self-pay

## 2010-11-21 ENCOUNTER — Encounter (HOSPITAL_COMMUNITY): Payer: Self-pay

## 2010-11-23 ENCOUNTER — Encounter (HOSPITAL_COMMUNITY): Payer: Self-pay

## 2010-11-25 ENCOUNTER — Encounter (HOSPITAL_COMMUNITY): Payer: Self-pay

## 2010-11-25 DIAGNOSIS — Z954 Presence of other heart-valve replacement: Secondary | ICD-10-CM | POA: Insufficient documentation

## 2010-11-25 DIAGNOSIS — I10 Essential (primary) hypertension: Secondary | ICD-10-CM | POA: Insufficient documentation

## 2010-11-25 DIAGNOSIS — Z951 Presence of aortocoronary bypass graft: Secondary | ICD-10-CM | POA: Insufficient documentation

## 2010-11-25 DIAGNOSIS — K219 Gastro-esophageal reflux disease without esophagitis: Secondary | ICD-10-CM | POA: Insufficient documentation

## 2010-11-25 DIAGNOSIS — Z87891 Personal history of nicotine dependence: Secondary | ICD-10-CM | POA: Insufficient documentation

## 2010-11-25 DIAGNOSIS — E119 Type 2 diabetes mellitus without complications: Secondary | ICD-10-CM | POA: Insufficient documentation

## 2010-11-25 DIAGNOSIS — R5381 Other malaise: Secondary | ICD-10-CM | POA: Insufficient documentation

## 2010-11-25 DIAGNOSIS — H548 Legal blindness, as defined in USA: Secondary | ICD-10-CM | POA: Insufficient documentation

## 2010-11-25 DIAGNOSIS — G4733 Obstructive sleep apnea (adult) (pediatric): Secondary | ICD-10-CM | POA: Insufficient documentation

## 2010-11-25 DIAGNOSIS — E785 Hyperlipidemia, unspecified: Secondary | ICD-10-CM | POA: Insufficient documentation

## 2010-11-25 DIAGNOSIS — I359 Nonrheumatic aortic valve disorder, unspecified: Secondary | ICD-10-CM | POA: Insufficient documentation

## 2010-11-25 DIAGNOSIS — I739 Peripheral vascular disease, unspecified: Secondary | ICD-10-CM | POA: Insufficient documentation

## 2010-11-25 DIAGNOSIS — Z7982 Long term (current) use of aspirin: Secondary | ICD-10-CM | POA: Insufficient documentation

## 2010-11-25 DIAGNOSIS — I2510 Atherosclerotic heart disease of native coronary artery without angina pectoris: Secondary | ICD-10-CM | POA: Insufficient documentation

## 2010-11-25 DIAGNOSIS — Z5189 Encounter for other specified aftercare: Secondary | ICD-10-CM | POA: Insufficient documentation

## 2010-11-25 DIAGNOSIS — N289 Disorder of kidney and ureter, unspecified: Secondary | ICD-10-CM | POA: Insufficient documentation

## 2010-11-25 DIAGNOSIS — I4891 Unspecified atrial fibrillation: Secondary | ICD-10-CM | POA: Insufficient documentation

## 2010-11-28 ENCOUNTER — Encounter (HOSPITAL_COMMUNITY): Payer: Self-pay

## 2010-11-30 ENCOUNTER — Encounter (HOSPITAL_COMMUNITY): Payer: Self-pay

## 2010-12-02 ENCOUNTER — Encounter (HOSPITAL_COMMUNITY): Payer: Self-pay

## 2010-12-05 ENCOUNTER — Other Ambulatory Visit: Payer: Self-pay

## 2010-12-05 ENCOUNTER — Encounter (HOSPITAL_COMMUNITY): Payer: Self-pay

## 2010-12-05 ENCOUNTER — Emergency Department (HOSPITAL_COMMUNITY): Payer: Medicare Other

## 2010-12-05 ENCOUNTER — Emergency Department (HOSPITAL_COMMUNITY)
Admission: EM | Admit: 2010-12-05 | Discharge: 2010-12-05 | Disposition: A | Discharge status: Home / Self Care | Payer: Medicare Other | Attending: Emergency Medicine | Admitting: Emergency Medicine

## 2010-12-05 DIAGNOSIS — H548 Legal blindness, as defined in USA: Secondary | ICD-10-CM | POA: Insufficient documentation

## 2010-12-05 DIAGNOSIS — E119 Type 2 diabetes mellitus without complications: Secondary | ICD-10-CM | POA: Insufficient documentation

## 2010-12-05 DIAGNOSIS — I4891 Unspecified atrial fibrillation: Secondary | ICD-10-CM | POA: Insufficient documentation

## 2010-12-05 DIAGNOSIS — Y92009 Unspecified place in unspecified non-institutional (private) residence as the place of occurrence of the external cause: Secondary | ICD-10-CM | POA: Insufficient documentation

## 2010-12-05 DIAGNOSIS — M129 Arthropathy, unspecified: Secondary | ICD-10-CM | POA: Insufficient documentation

## 2010-12-05 DIAGNOSIS — W010XXA Fall on same level from slipping, tripping and stumbling without subsequent striking against object, initial encounter: Secondary | ICD-10-CM | POA: Insufficient documentation

## 2010-12-05 DIAGNOSIS — S20219A Contusion of unspecified front wall of thorax, initial encounter: Secondary | ICD-10-CM | POA: Insufficient documentation

## 2010-12-05 DIAGNOSIS — Z8639 Personal history of other endocrine, nutritional and metabolic disease: Secondary | ICD-10-CM | POA: Insufficient documentation

## 2010-12-05 DIAGNOSIS — I10 Essential (primary) hypertension: Secondary | ICD-10-CM | POA: Insufficient documentation

## 2010-12-05 DIAGNOSIS — R0602 Shortness of breath: Secondary | ICD-10-CM | POA: Insufficient documentation

## 2010-12-05 DIAGNOSIS — Z8546 Personal history of malignant neoplasm of prostate: Secondary | ICD-10-CM | POA: Insufficient documentation

## 2010-12-05 DIAGNOSIS — Z79899 Other long term (current) drug therapy: Secondary | ICD-10-CM | POA: Insufficient documentation

## 2010-12-05 DIAGNOSIS — S298XXA Other specified injuries of thorax, initial encounter: Secondary | ICD-10-CM | POA: Insufficient documentation

## 2010-12-05 DIAGNOSIS — I2510 Atherosclerotic heart disease of native coronary artery without angina pectoris: Secondary | ICD-10-CM | POA: Insufficient documentation

## 2010-12-05 DIAGNOSIS — Z862 Personal history of diseases of the blood and blood-forming organs and certain disorders involving the immune mechanism: Secondary | ICD-10-CM | POA: Insufficient documentation

## 2010-12-05 DIAGNOSIS — R1011 Right upper quadrant pain: Secondary | ICD-10-CM | POA: Insufficient documentation

## 2010-12-05 DIAGNOSIS — G4733 Obstructive sleep apnea (adult) (pediatric): Secondary | ICD-10-CM | POA: Insufficient documentation

## 2010-12-05 DIAGNOSIS — E785 Hyperlipidemia, unspecified: Secondary | ICD-10-CM | POA: Insufficient documentation

## 2010-12-05 DIAGNOSIS — IMO0002 Reserved for concepts with insufficient information to code with codable children: Secondary | ICD-10-CM | POA: Insufficient documentation

## 2010-12-05 DIAGNOSIS — F3289 Other specified depressive episodes: Secondary | ICD-10-CM | POA: Insufficient documentation

## 2010-12-05 DIAGNOSIS — F329 Major depressive disorder, single episode, unspecified: Secondary | ICD-10-CM | POA: Insufficient documentation

## 2010-12-05 DIAGNOSIS — K219 Gastro-esophageal reflux disease without esophagitis: Secondary | ICD-10-CM | POA: Insufficient documentation

## 2010-12-05 LAB — APTT: aPTT: 31 seconds (ref 24–37)

## 2010-12-05 LAB — CBC
Platelets: 87 10*3/uL — ABNORMAL LOW (ref 150–400)
RDW: 13.4 % (ref 11.5–15.5)
WBC: 6.3 10*3/uL (ref 4.0–10.5)

## 2010-12-05 LAB — BASIC METABOLIC PANEL
Chloride: 104 mEq/L (ref 96–112)
GFR calc Af Amer: 89 mL/min — ABNORMAL LOW (ref >90)
Potassium: 4.2 mEq/L (ref 3.5–5.1)

## 2010-12-05 LAB — PROTIME-INR
INR: 1.04 (ref 0.00–1.49)
Prothrombin Time: 13.8 seconds (ref 11.6–15.2)

## 2010-12-05 MED ORDER — HYDROCODONE-ACETAMINOPHEN 5-325 MG PO TABS: 1.0000 | ORAL_TABLET | ORAL | Status: AC | PRN

## 2010-12-05 MED ORDER — HYDROCODONE-ACETAMINOPHEN 5-325 MG PO TABS: 1.0000 | ORAL_TABLET | Freq: Once | ORAL | Status: DC

## 2010-12-05 MED ORDER — FENTANYL CITRATE 0.05 MG/ML IJ SOLN
50.0000 ug | Freq: Once | INTRAMUSCULAR | Status: AC
  Administered 2010-12-05: 50 ug via INTRAVENOUS
  Filled 2010-12-05: qty 2

## 2010-12-05 NOTE — Discharge Instructions (Signed)
Blunt Chest Trauma  Chest injury caused by blunt trauma can be very painful. The pain is worse with movement or breathing. Most often these injuries result in bruised or fractured ribs. Minor rib fractures may not be seen on the initial X-rays. Most broken and bruised ribs from blunt chest injuries are not serious and get better within 1 to 3 weeks with rest and mild pain medicine. Internal organs (heart, lungs) can be injured with blunt trauma. Further examination may be needed.  Limit activities until you can move around without much pain. Do not do any strenuous work until your injury is healed. Ice packs may be put on the injured area for 20 to 30 minutes several times daily to reduce pain. Rib belts may also be used to reduce pain and should be worn as directed by your caregiver. Take several deep breaths hourly to keep your lungs clear.   SEEK IMMEDIATE MEDICAL CARE IF:   · You develop increased pain, shortness of breath, or cough up blood.   · You develop nausea, vomiting, or abdominal pain.   · You develop fever, dizziness, weakness, or fainting.   Document Released: 02/17/2004 Document Revised: 09/21/2010 Document Reviewed: 01/09/2005  [employer]® Patient Information ©2012 [employer].

## 2010-12-05 NOTE — ED Provider Notes (Signed)
History     CSN: [id number] Arrival date & time: 12/05/2010  7:55 AM   First MD Initiated Contact with Patient 12/05/10 0809      Chief Complaint  Patient presents with  . Fall  . Rib Injury    (Consider location/radiation/quality/duration/timing/severity/associated sxs/prior treatment) HPI Comments: Patient states that he had a fall 6 days ago.  It was a mechanical fall in his driveway.  He had no loss of consciousness.  He landed on his right side and did hit his right hip, right chest and right head.  He comes in today because he has increased right lower rib pain and right upper abdominal pain.  Patient has no lightheadedness, dizziness.  No noted hematuria or bloody stools.  Patient only describes shortness of breath with deep breaths.  Over the last week he's been able to perform his water aerobics class as well as his cardiac rehabilitation without any difficulty.  This morning when he went to get up as when he had increased pain and why they came in today.  No nausea or vomiting.  Patient is a 77 y.o. male presenting with fall. The history is provided by the patient. No language interpreter was used.  Fall The accident occurred more than 2 days ago. The fall occurred while walking. He landed on concrete. The volume of blood lost was minimal. The point of impact was the head. The pain is moderate. He was ambulatory at the scene. There was no entrapment after the fall. There was no drug use involved in the accident. There was no alcohol use involved in the accident. Associated symptoms include abdominal pain. Pertinent negatives include no visual change, no fever, no numbness, no bowel incontinence, no nausea, no vomiting, no hematuria, no headaches, no hearing loss, no loss of consciousness and no tingling.    Past Medical History  Diagnosis Date  . Adenomatous colon polyp 03/1993  . Prostate cancer   . Depression   . OSA (obstructive sleep apnea)   . Hearing loss   . Atrial  fibrillation     not anticoag candidate due to decond and falls  . Legally blind 1987  . AORTIC STENOSIS     s/p AVR 01/2010  . CLOSTRIDIUM DIFFICILE COLITIS 12/30/2009  . CORONARY ARTERY DISEASE     s/p CABG 01/28/10  . HIP REPLACEMENT, RIGHT, HX OF 2006  . HYPERTROPHY PROSTATE W/UR OBST & OTH LUTS   . MRSA infection 04/2010  . Gout   . THROMBOCYTOPENIA   . Arthritis   . DEGENERATIVE DISC DISEASE   . Diabetes mellitus, type 2   . GERD   . Hyperlipidemia   . Hypertension     Past Surgical History  Procedure Date  . Coronary artery bypass graft 01/28/2010    x3  . Aortic valve replacement 01/28/2010  . Prostatectomy 1993  . Hernia repair 03/15/2009    left  . Anal sphincterotomy 1996  . Total hip arthroplasty 2006    left  . Cataract extraction, bilateral     4/11 & 5/11  . Cholecystectomy 2011    Family History  Problem Relation Age of Onset  . Heart disease Mother   . Lung cancer Mother   . Hypertension Brother   . Alcohol abuse Father   . Sudden death Father   . Aneurysm Father   . Heart attack Brother   . Gout Brother   . Arthritis Brother   . Arthritis Other   . Gout Other  History  Substance Use Topics  . Smoking status: Former Smoker -- 1.0 packs/day for 2 years    Types: Cigars    Quit date: 07/01/2009  . 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 Alcohol Use: No      Review of Systems  Constitutional: Negative.  Negative for fever and chills.  HENT: Negative.   Eyes: Negative.  Negative for discharge and redness.  Respiratory: Positive for shortness of breath. Negative for cough.   Cardiovascular: Positive for chest pain.  Gastrointestinal: Positive for abdominal pain. Negative for nausea, vomiting and bowel incontinence.  Genitourinary: Negative.  Negative for hematuria.  Musculoskeletal: Negative.  Negative for back pain.  Skin: Positive for color change. Negative for rash.    Neurological: Negative for tingling, loss of consciousness, syncope, numbness and headaches.  Hematological: Negative.  Negative for adenopathy.  Psychiatric/Behavioral: Negative.  Negative for confusion.  All other systems reviewed and are negative.    Allergies  Doxycycline; Septra; and Morphine  Home Medications   Current Outpatient Rx  Name Route Sig Dispense Refill  . ALLOPURINOL 300 MG PO TABS Oral Take 1 tablet (300 mg total) by mouth at bedtime. 90 tablet 3  . VITAMIN C 500 MG PO TABS Oral Take 500 mg by mouth daily.      . ASPIRIN 325 MG PO TABS Oral Take 325 mg by mouth daily.      Marland Kitchen CETIRIZINE HCL 10 MG PO TABS Oral Take 10 mg by mouth daily.      Marland Kitchen VITAMIN D 1000 UNITS PO TABS Oral Take 1,000 Units by mouth daily.      Marland Kitchen DILTIAZEM HCL COATED BEADS 360 MG PO TB24 Oral Take 1 tablet (360 mg total) by mouth daily. 90 tablet 3  . DOCUSATE SODIUM 100 MG PO CAPS Oral Take 100 mg by mouth daily.      Marland Kitchen ESCITALOPRAM OXALATE 20 MG PO TABS Oral Take 1 tablet (20 mg total) by mouth daily. 90 tablet 3  . FUROSEMIDE 40 MG PO TABS Oral Take 0.5 tablets (20 mg total) by mouth daily. 90 tablet 2  . METOPROLOL TARTRATE 25 MG PO TABS Oral Take 1 tablet (25 mg total) by mouth 2 (two) times daily. 180 tablet 3  . ICAPS PO Oral Take by mouth daily.      Marland Kitchen MUPIROCIN 2 % EX OINT      . OMEPRAZOLE 20 MG PO CPDR Oral Take 1 capsule (20 mg total) by mouth daily after breakfast. 90 capsule 3  . POTASSIUM CHLORIDE CR 10 MEQ PO TBCR Oral Take 1 tablet (10 mEq total) by mouth daily. 90 tablet 3  . VITAMIN B-6 100 MG PO TABS Oral Take 100 mg by mouth daily.      Marland Kitchen RANITIDINE HCL 75 MG PO TABS Oral Take 1 tablet (75 mg total) by mouth every morning. 90 tablet 3  . ROPINIROLE HCL 0.25 MG PO TABS Oral Take 3 tablets (0.75 mg total) by mouth at bedtime. 270 tablet 1    Please disregard previous RX, Thanks!  Marland Kitchen ROSUVASTATIN CALCIUM 10 MG PO TABS Oral Take 2 tablets (20 mg total) by mouth daily. 90 tablet 3     BP 141/50  Pulse 58  Temp(Src) 97.9 F (36.6 C) (Oral)  Resp 18  SpO2 95%  Physical Exam  Constitutional: He is oriented to person, place, and time. He appears well-developed and well-nourished.  Non-toxic appearance. He does  not have a sickly appearance.  HENT:  Head: Normocephalic.       At patient's right upper eye and eyebrow area there is yellow discoloration and a small abrasion.  Eyes: Conjunctivae, EOM and lids are normal. Pupils are equal, round, and reactive to light.  Neck: Trachea normal, normal range of motion and full passive range of motion without pain. Neck supple.  Cardiovascular: Regular rhythm and normal heart sounds.   Pulmonary/Chest: Effort normal and breath sounds normal. No respiratory distress.       There is tenderness to palpation over the right lateral lower chest wall.  No crepitus is noted.  No bruising or discoloration noted.  Abdominal: Soft. Normal appearance. He exhibits no distension. There is no tenderness. There is no rebound and no CVA tenderness.       No rebound or guarding on abdominal exam.  No focal right upper quadrant tenderness.  Musculoskeletal: Normal range of motion.  Neurological: He is alert and oriented to person, place, and time. He has normal strength.  Skin: Skin is warm, dry and intact. No rash noted.  Psychiatric: He has a normal mood and affect. His behavior is normal. Judgment and thought content normal.    ED Course  Procedures (including critical care time)  Results for orders placed during the hospital encounter of 12/05/10  CBC      Component Value Range   WBC 6.3  4.0 - 10.5 (K/uL)   RBC 4.70  4.22 - 5.81 (MIL/uL)   Hemoglobin 15.0  13.0 - 17.0 (g/dL)   HCT 16.1  09.6 - 04.5 (%)   MCV 90.4  78.0 - 100.0 (fL)   MCH 31.9  26.0 - 34.0 (pg)   MCHC 35.3  30.0 - 36.0 (g/dL)   RDW 40.9  81.1 - 91.4 (%)   Platelets PENDING  150 - 400 (K/uL)  BASIC METABOLIC PANEL      Component Value Range   Sodium 141  135 - 145  (mEq/L)   Potassium 4.2  3.5 - 5.1 (mEq/L)   Chloride 104  96 - 112 (mEq/L)   CO2 28  19 - 32 (mEq/L)   Glucose, Bld 110 (*) 70 - 99 (mg/dL)   BUN 18  6 - 23 (mg/dL)   Creatinine, Ser 7.82  0.50 - 1.35 (mg/dL)   Calcium 95.6  8.4 - 10.5 (mg/dL)   GFR calc non Af Amer 77 (*) >90 (mL/min)   GFR calc Af Amer 89 (*) >90 (mL/min)  APTT      Component Value Range   aPTT 31  24 - 37 (seconds)  PROTIME-INR      Component Value Range   Prothrombin Time 13.8  11.6 - 15.2 (seconds)   INR 1.04  0.00 - 1.49    Dg Chest 2 View  12/05/2010  *RADIOLOGY REPORT*  Clinical Data: Chest pain.  Status post fall.  CHEST - 2 VIEW  Comparison: 04/07/2010  Findings: Prior median sternotomy and CABG procedure.  Mild cardiac enlargement.  No pleural effusion or pulmonary edema identified.  Similar appearance of scarring within the right lung base.  The bones are osteopenic.  No displaced rib fractures are identified.  Spondylosis identified within the thoracic spine.  IMPRESSION:  1.  No acute findings. 2.  Right base scarring.  Original Report Authenticated By: Rosealee Albee, M.D.       Date: 12/05/2010  Rate: 53  Rhythm: sinus bradycardia  QRS Axis: normal  Intervals: PR prolonged  ST/T  Wave abnormalities: normal  Conduction Disutrbances:first-degree A-V block   Narrative Interpretation:   Old EKG Reviewed: changes noted from 02/07/2010 where patient had atrial fibrillation with RVR.    MDM  Patient with no rib fracture seen on his chest x-ray.  No signs of hemothorax, pneumothorax, pleural effusion.  No findings for any retroperitoneal bleeding as the patient has a benign abdomen, no lightheadedness, no dizziness and a normal and stable hemoglobin level.  Patient likely has chest wall contusion related to his fall.  Patient has been instructed to followup with his primary care physician Dr. Felicity Coyer later this week for reevaluation particularly if his pain is not improving.  He understands this at  time of discharge and his wife is present in the room.  He'll receive pain medication for home use and has been warned about constipation as well.  Patient has tenderness to palpation and so is unlikely to be cardiac pain at this time.  He has no acute EKG changes.  The pain is not exertional.  PE also seems unlikely as patient has normal oxygen saturations, is not tachycardic and is not short of breath.        Nat Christen, MD 12/05/10 [phone]

## 2010-12-05 NOTE — ED Notes (Signed)
Pt reports he was bringing the trash can up from the curb and trash can got caught and pt fell forward hitting the right side (6 days ago). Pt reports increased discomfort to right side, increases with deep breathing. No deformity noted. Tenderness with palpation. Perrla. Pt is currently doing cardia rehab and uses cane for ambulation.

## 2010-12-05 NOTE — ED Notes (Signed)
Patient presents with right rib pain since 6 days ago after falling, tripping on an uneven driveway.  Patient reporting he hit is head and right hip.

## 2010-12-07 ENCOUNTER — Encounter (HOSPITAL_COMMUNITY): Payer: Self-pay

## 2010-12-09 ENCOUNTER — Encounter (HOSPITAL_COMMUNITY)
Admission: RE | Admit: 2010-12-09 | Discharge: 2010-12-09 | Disposition: A | Discharge status: Home / Self Care | Payer: Self-pay | Source: Ambulatory Visit | Attending: Cardiovascular Disease | Admitting: Cardiovascular Disease

## 2010-12-09 ENCOUNTER — Other Ambulatory Visit (HOSPITAL_COMMUNITY): Payer: Self-pay | Admitting: *Deleted

## 2010-12-12 ENCOUNTER — Encounter (HOSPITAL_COMMUNITY)
Admission: RE | Admit: 2010-12-12 | Discharge: 2010-12-12 | Disposition: A | Discharge status: Home / Self Care | Payer: Self-pay | Source: Ambulatory Visit | Attending: Cardiovascular Disease | Admitting: Cardiovascular Disease

## 2010-12-14 ENCOUNTER — Encounter (HOSPITAL_COMMUNITY)
Admission: RE | Admit: 2010-12-14 | Discharge: 2010-12-14 | Disposition: A | Discharge status: Home / Self Care | Payer: Self-pay | Source: Ambulatory Visit | Attending: Cardiovascular Disease | Admitting: Cardiovascular Disease

## 2010-12-16 ENCOUNTER — Encounter (HOSPITAL_COMMUNITY): Payer: Self-pay

## 2010-12-19 ENCOUNTER — Encounter (HOSPITAL_COMMUNITY)
Admission: RE | Admit: 2010-12-19 | Discharge: 2010-12-19 | Disposition: A | Discharge status: Home / Self Care | Payer: Self-pay | Source: Ambulatory Visit | Attending: Cardiovascular Disease | Admitting: Cardiovascular Disease

## 2010-12-21 ENCOUNTER — Encounter (HOSPITAL_COMMUNITY)
Admission: RE | Admit: 2010-12-21 | Discharge: 2010-12-21 | Disposition: A | Discharge status: Home / Self Care | Payer: Self-pay | Source: Ambulatory Visit | Attending: Cardiovascular Disease | Admitting: Cardiovascular Disease

## 2010-12-23 ENCOUNTER — Encounter (HOSPITAL_COMMUNITY)
Admission: RE | Admit: 2010-12-23 | Discharge: 2010-12-23 | Disposition: A | Discharge status: Home / Self Care | Payer: Self-pay | Source: Ambulatory Visit | Attending: Cardiovascular Disease | Admitting: Cardiovascular Disease

## 2010-12-26 ENCOUNTER — Encounter (HOSPITAL_COMMUNITY)
Admission: RE | Admit: 2010-12-26 | Discharge: 2010-12-26 | Disposition: A | Discharge status: Home / Self Care | Payer: Self-pay | Source: Ambulatory Visit | Attending: Cardiovascular Disease | Admitting: Cardiovascular Disease

## 2010-12-26 DIAGNOSIS — Z7982 Long term (current) use of aspirin: Secondary | ICD-10-CM | POA: Insufficient documentation

## 2010-12-26 DIAGNOSIS — I359 Nonrheumatic aortic valve disorder, unspecified: Secondary | ICD-10-CM | POA: Insufficient documentation

## 2010-12-26 DIAGNOSIS — I10 Essential (primary) hypertension: Secondary | ICD-10-CM | POA: Insufficient documentation

## 2010-12-26 DIAGNOSIS — G4733 Obstructive sleep apnea (adult) (pediatric): Secondary | ICD-10-CM | POA: Insufficient documentation

## 2010-12-26 DIAGNOSIS — Z954 Presence of other heart-valve replacement: Secondary | ICD-10-CM | POA: Insufficient documentation

## 2010-12-26 DIAGNOSIS — Z951 Presence of aortocoronary bypass graft: Secondary | ICD-10-CM | POA: Insufficient documentation

## 2010-12-26 DIAGNOSIS — E785 Hyperlipidemia, unspecified: Secondary | ICD-10-CM | POA: Insufficient documentation

## 2010-12-26 DIAGNOSIS — I2510 Atherosclerotic heart disease of native coronary artery without angina pectoris: Secondary | ICD-10-CM | POA: Insufficient documentation

## 2010-12-26 DIAGNOSIS — H548 Legal blindness, as defined in USA: Secondary | ICD-10-CM | POA: Insufficient documentation

## 2010-12-26 DIAGNOSIS — Z5189 Encounter for other specified aftercare: Secondary | ICD-10-CM | POA: Insufficient documentation

## 2010-12-26 DIAGNOSIS — I4891 Unspecified atrial fibrillation: Secondary | ICD-10-CM | POA: Insufficient documentation

## 2010-12-26 DIAGNOSIS — K219 Gastro-esophageal reflux disease without esophagitis: Secondary | ICD-10-CM | POA: Insufficient documentation

## 2010-12-26 DIAGNOSIS — R5381 Other malaise: Secondary | ICD-10-CM | POA: Insufficient documentation

## 2010-12-26 DIAGNOSIS — Z87891 Personal history of nicotine dependence: Secondary | ICD-10-CM | POA: Insufficient documentation

## 2010-12-26 DIAGNOSIS — E119 Type 2 diabetes mellitus without complications: Secondary | ICD-10-CM | POA: Insufficient documentation

## 2010-12-26 DIAGNOSIS — N289 Disorder of kidney and ureter, unspecified: Secondary | ICD-10-CM | POA: Insufficient documentation

## 2010-12-26 DIAGNOSIS — I739 Peripheral vascular disease, unspecified: Secondary | ICD-10-CM | POA: Insufficient documentation

## 2010-12-28 ENCOUNTER — Encounter (HOSPITAL_COMMUNITY)
Admission: RE | Admit: 2010-12-28 | Discharge: 2010-12-28 | Disposition: A | Discharge status: Home / Self Care | Payer: Self-pay | Source: Ambulatory Visit | Attending: Cardiovascular Disease | Admitting: Cardiovascular Disease

## 2010-12-29 ENCOUNTER — Encounter: Payer: Self-pay | Admitting: *Deleted

## 2010-12-30 ENCOUNTER — Encounter (HOSPITAL_COMMUNITY): Payer: Self-pay

## 2011-01-02 ENCOUNTER — Encounter (HOSPITAL_COMMUNITY)
Admission: RE | Admit: 2011-01-02 | Discharge: 2011-01-02 | Disposition: A | Discharge status: Home / Self Care | Payer: Self-pay | Source: Ambulatory Visit | Attending: Cardiovascular Disease | Admitting: Cardiovascular Disease

## 2011-01-04 ENCOUNTER — Encounter (HOSPITAL_COMMUNITY)
Admission: RE | Admit: 2011-01-04 | Discharge: 2011-01-04 | Disposition: A | Discharge status: Home / Self Care | Payer: Self-pay | Source: Ambulatory Visit | Attending: Cardiovascular Disease | Admitting: Cardiovascular Disease

## 2011-01-06 ENCOUNTER — Other Ambulatory Visit (HOSPITAL_BASED_OUTPATIENT_CLINIC_OR_DEPARTMENT_OTHER): Payer: Medicare Other | Admitting: Lab

## 2011-01-06 ENCOUNTER — Ambulatory Visit (HOSPITAL_BASED_OUTPATIENT_CLINIC_OR_DEPARTMENT_OTHER): Payer: Medicare Other | Admitting: Oncology

## 2011-01-06 ENCOUNTER — Encounter (HOSPITAL_COMMUNITY)
Admission: RE | Admit: 2011-01-06 | Discharge: 2011-01-06 | Disposition: A | Discharge status: Home / Self Care | Payer: Self-pay | Source: Ambulatory Visit | Attending: Cardiovascular Disease | Admitting: Cardiovascular Disease

## 2011-01-06 ENCOUNTER — Other Ambulatory Visit: Payer: Self-pay | Admitting: Oncology

## 2011-01-06 VITALS — BP 150/66 | HR 65 | Temp 97.3°F | Ht 70.0 in | Wt 229.3 lb

## 2011-01-06 DIAGNOSIS — K219 Gastro-esophageal reflux disease without esophagitis: Secondary | ICD-10-CM

## 2011-01-06 DIAGNOSIS — Z8546 Personal history of malignant neoplasm of prostate: Secondary | ICD-10-CM

## 2011-01-06 DIAGNOSIS — D72821 Monocytosis (symptomatic): Secondary | ICD-10-CM

## 2011-01-06 DIAGNOSIS — D696 Thrombocytopenia, unspecified: Secondary | ICD-10-CM

## 2011-01-06 DIAGNOSIS — I2510 Atherosclerotic heart disease of native coronary artery without angina pectoris: Secondary | ICD-10-CM

## 2011-01-06 DIAGNOSIS — R0602 Shortness of breath: Secondary | ICD-10-CM

## 2011-01-06 DIAGNOSIS — E119 Type 2 diabetes mellitus without complications: Secondary | ICD-10-CM

## 2011-01-06 DIAGNOSIS — I10 Essential (primary) hypertension: Secondary | ICD-10-CM

## 2011-01-06 LAB — CBC WITH DIFFERENTIAL/PLATELET
Basophils Absolute: 0 10*3/uL (ref 0.0–0.1)
EOS%: 0.3 % (ref 0.0–7.0)
HCT: 42.2 % (ref 38.4–49.9)
HGB: 14.8 g/dL (ref 13.0–17.1)
MCH: 31.4 pg (ref 27.2–33.4)
MCV: 89.4 fL (ref 79.3–98.0)
MONO%: 18.3 % — ABNORMAL HIGH (ref 0.0–14.0)
NEUT%: 49.3 % (ref 39.0–75.0)
lymph#: 1.9 10*3/uL (ref 0.9–3.3)

## 2011-01-06 LAB — COMPREHENSIVE METABOLIC PANEL
Albumin: 4.5 g/dL (ref 3.5–5.2)
Alkaline Phosphatase: 92 U/L (ref 39–117)
BUN: 14 mg/dL (ref 6–23)
Calcium: 10 mg/dL (ref 8.4–10.5)
Glucose, Bld: 99 mg/dL (ref 70–99)
Potassium: 4.4 mEq/L (ref 3.5–5.3)

## 2011-01-06 LAB — CHCC SMEAR

## 2011-01-06 NOTE — Progress Notes (Signed)
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  Extensive work up including bone marrow biopsy was negative.   CURRENT THERAPY:  Watchful observation.   INTERVAL HISTORY: [name] 77 y.o. male returns for regular follow up with his wife. He complains of dyspnea exertion been going on ever since and he was diagnosed with chronic issue. He had undergone CABG in 2011. He was told that he does not have congestive heart failure. He also had infections for probably cause with a negative pulmonary function tests. He cannot walk more than 200 feet without dyspnea. He has some minor fatigue however he is stable to perform light activities daily living. He denies any sign of visible source of bleeding. He denies drenching night sweats, recurrent infection, myalgia, arthralgia.  Patient denies headache, confusion, drenching night sweats, palpable lymph node swelling, mucositis, odynophagia, dysphagia, nausea vomiting, jaundice, chest pain, palpitation, productive cough, gum bleeding, epistaxis, hematemesis, hemoptysis, abdominal pain, abdominal swelling, early satiety, melena, hematochezia, hematuria, skin rash, spontaneous bleeding, joint swelling, joint pain, heat or cold intolerance, bowel bladder incontinence, back pain, focal motor weakness, paresthesia, depression, suicidal or homocidal ideation, feeling hopelessness.  At baseline he has visual acuity issue and was in a history of stroke.  MEDICAL HISTORY: Past Medical History  Diagnosis Date  . Adenomatous colon polyp 03/1993  . Prostate cancer   . Depression   . OSA (obstructive sleep apnea)   . Hearing loss   . Atrial fibrillation     not anticoag candidate due to decond and falls  . Legally blind 1987  . AORTIC STENOSIS     s/p AVR 01/2010  . CLOSTRIDIUM DIFFICILE COLITIS 12/30/2009  . CORONARY ARTERY DISEASE     s/p  CABG 01/28/10  . HIP REPLACEMENT, RIGHT, HX OF 2006  . HYPERTROPHY PROSTATE W/UR OBST & OTH LUTS   . MRSA infection 04/2010  . Gout   . THROMBOCYTOPENIA   . Arthritis   . DEGENERATIVE DISC DISEASE   . Diabetes mellitus, type 2   . GERD   . Hyperlipidemia   . Hypertension     SURGICAL HISTORY:  Past Surgical History  Procedure Date  . Coronary artery bypass graft 01/28/2010    x3  . Aortic valve replacement 01/28/2010  . Prostatectomy 1993  . Hernia repair 03/15/2009    left  . Anal sphincterotomy 1996  . Total hip arthroplasty 2006    left  . Cataract extraction, bilateral     4/11 & 5/11  . Cholecystectomy 2011    MEDICATIONS: Current Outpatient Prescriptions  Medication Sig Dispense Refill  . acetaminophen (TYLENOL) 500 MG tablet Take 1,000 mg by mouth daily.        Marland Kitchen allopurinol (ZYLOPRIM) 300 MG tablet Take 1 tablet (300 mg total) by mouth at bedtime.  90 tablet  3  . Ascorbic Acid (VITAMIN C PO) Take 1 tablet by mouth every morning.        Marland Kitchen aspirin 325 MG tablet Take 325 mg by mouth every morning.       Marland Kitchen CALCIUM PO Take 1 tablet by mouth every morning.        . Cholecalciferol (VITAMIN D-3 PO) Take 2,000 Units by mouth daily.        Marland Kitchen diltiazem (CARDIZEM CD) 180 MG 24 hr capsule Take 180 mg by mouth 2 (two) times daily.       Marland Kitchen docusate sodium (  COLACE) 100 MG capsule Take 100 mg by mouth daily.        Marland Kitchen escitalopram (LEXAPRO) 20 MG tablet Take 1 tablet (20 mg total) by mouth daily.  90 tablet  3  . furosemide (LASIX) 40 MG tablet Take 0.5 tablets (20 mg total) by mouth daily.  90 tablet  2  . metoprolol tartrate (LOPRESSOR) 25 MG tablet Take 1 tablet (25 mg total) by mouth 2 (two) times daily.  180 tablet  3  . Multiple Vitamins-Minerals (ICAPS MV PO) Take 1 tablet by mouth every morning.        . Multiple Vitamins-Minerals (MULTIVITAMINS THER. W/MINERALS) TABS Take 1 tablet by mouth every morning.        Marland Kitchen omeprazole (PRILOSEC) 20 MG capsule Take 1 capsule (20 mg  total) by mouth daily after breakfast.  90 capsule  3  . polysaccharide iron (NIFEREX) 150 MG CAPS capsule Take 150 mg by mouth daily.        . potassium chloride (K-DUR) 10 MEQ tablet Take 1 tablet (10 mEq total) by mouth daily.  90 tablet  3  . Pyridoxine HCl (VITAMIN B-6 PO) Take 1 tablet by mouth every morning.        . ranitidine (ZANTAC) 150 MG tablet Take 75 mg by mouth Every morning.      Marland Kitchen rOPINIRole (REQUIP) 0.25 MG tablet Take 3 tablets (0.75 mg total) by mouth at bedtime.  270 tablet  1  . rosuvastatin (CRESTOR) 10 MG tablet Take 10 mg by mouth 2 (two) times daily.        . Sertaconazole Nitrate 2 % CREA Apply topically as needed. Athletes foot       . Thiamine HCl (VITAMIN B-1 PO) Take 1 tablet by mouth every morning.        Marland Kitchen DISCONTD: rosuvastatin (CRESTOR) 10 MG tablet Take 2 tablets (20 mg total) by mouth daily.  90 tablet  3  . DISCONTD: diltiazem (CARDIZEM LA) 360 MG 24 hr tablet Take 1 tablet (360 mg total) by mouth daily.  90 tablet  3    ALLERGIES:  is allergic to doxycycline; septra; and morphine.  REVIEW OF SYSTEMS:  The rest of the 14-point review of system was negative.   Filed Vitals:   01/06/11 1520  BP: 150/66  Pulse: 65  Temp: 97.3 F (36.3 C)   Wt Readings from Last 3 Encounters:  01/06/11 229 lb 4.8 oz (104.01 kg)  07/08/10 217 lb 6.4 oz (98.612 kg)  12/05/10 220 lb (99.791 kg)   ECOG Performance status: 2 due to SOB.   PHYSICAL EXAMINATION:   General:  Mildly obese male in no acute distress.  Eyes:  no scleral icterus.  ENT:  There were no oropharyngeal lesions.  Neck was without thyromegaly.  Lymphatics:  Negative cervical, supraclavicular or axillary adenopathy.  Respiratory: lungs were clear bilaterally without wheezing or crackles.  Cardiovascular:  Regular rate and rhythm, S1/S2, without murmur, rub or gallop.  There was 1+ bilateral pedal edema.  GI:  abdomen was soft, flat, nontender, nondistended, without organomegaly.  Muscoloskeletal:  no  spinal tenderness of palpation of vertebral spine.  Skin exam was without echymosis, petichae.  Neuro exam was nonfocal.  Patient needed minimal assistance to get on and off exam table.  Gait was normal.  Patient was alerted and oriented.  Attention was good.   Language was appropriate.  Mood was normal without depression.  Speech was not pressured.  Thought content was not tangential.  LABORATORY/RADIOLOGY DATA:  Lab Results  Component Value Date   WBC 6.0 01/06/2011   HGB 14.8 01/06/2011   HCT 42.2 01/06/2011   PLT 85* 01/06/2011   GLUCOSE 99 01/06/2011   CHOL 138 09/13/2010   TRIG 74.0 09/13/2010   HDL 57.70 09/13/2010   LDLCALC 66 09/13/2010   ALT 21 01/06/2011   AST 20 01/06/2011   NA 140 01/06/2011   K 4.4 01/06/2011   CL 101 01/06/2011   CREATININE 1.09 01/06/2011   BUN 14 01/06/2011   CO2 30 01/06/2011   TSH 1.65 09/13/2010   PSA 0.21 09/13/2010   INR 1.04 12/05/2010   HGBA1C 5.6 09/13/2010    ASSESSMENT AND PLAN:   1. Chronic thrombocytopenia:  Unclear etiology.  Most likely mild ITP.  Extensive work up was negative for consumptive process, portal hypertension, primary bone marrow failure.   I discussed with [name] and his wife that the chance of spontaneous hemorrhage with platelets 30,000 is minimal. His platelet count have been hovering around 80,000. I recommend observation at this time. In the future, if his thrombocytopenia worsens to less than 50 or he develops pancytopenia and his performance is still reasonably stable, I may consider diagnostic bone marrow biopsy at that time. As of today a repeat bone a biopsy is of low clinical utility.  2. Coronary artery disease:  S/p CABG in 2011.   He is on aspirin, diltiazem, metoprolol, and Crestor.  3. History of gout:   He is on allopurinol. 4. Diabetes mellitus, type II:  Diet control per PCP.  5. Hypertension:  Well controlled on metoprolol, Lasix, and Cardizem per PCP. 6. Hyperlipidemia:  He is on Crestor per  PCP. 7. Gastroesophageal reflux disease:  He is on omeprazole.  8. Dyspnea on exertion:  Most likely due to deconditioning.  Per family, extensive work up was negative for cardiac, pulm causes.  He does not have anemia as a cause of this presentation.  9. Follow up:  CBC in 4 and 8 months. I will see him myself in one year.

## 2011-01-09 ENCOUNTER — Encounter (HOSPITAL_COMMUNITY)
Admission: RE | Admit: 2011-01-09 | Discharge: 2011-01-09 | Disposition: A | Discharge status: Home / Self Care | Payer: Self-pay | Source: Ambulatory Visit | Attending: Cardiovascular Disease | Admitting: Cardiovascular Disease

## 2011-01-09 NOTE — Progress Notes (Signed)
[name] came to [facility] today for exercise.  He brought to our attention that he has an area of soreness on his left chest wall just under the breast area.  He has had some sticking type discomfort and wondered if this could be a wire.  This area is lateral to his sternal incision and is sore to the touch.  He is advised to consult with Dr. Allyson Sabal and in the meantime to try icing the area or heat if that relieves more.  Also he participates in water aerobics and is advised to omit arm exercises for the next several days. We will follow up with him to assure he has contacted MD and is making improvement.

## 2011-01-11 ENCOUNTER — Encounter (HOSPITAL_COMMUNITY)
Admission: RE | Admit: 2011-01-11 | Discharge: 2011-01-11 | Disposition: A | Discharge status: Home / Self Care | Payer: Self-pay | Source: Ambulatory Visit | Attending: Cardiovascular Disease | Admitting: Cardiovascular Disease

## 2011-01-13 ENCOUNTER — Encounter (HOSPITAL_COMMUNITY)
Admission: RE | Admit: 2011-01-13 | Discharge: 2011-01-13 | Disposition: A | Discharge status: Home / Self Care | Payer: Self-pay | Source: Ambulatory Visit | Attending: Cardiovascular Disease | Admitting: Cardiovascular Disease

## 2011-01-16 ENCOUNTER — Encounter (HOSPITAL_COMMUNITY): Payer: Self-pay

## 2011-01-18 ENCOUNTER — Encounter (HOSPITAL_COMMUNITY): Payer: Self-pay

## 2011-01-20 ENCOUNTER — Encounter (HOSPITAL_COMMUNITY): Payer: Self-pay

## 2011-01-23 ENCOUNTER — Encounter (HOSPITAL_COMMUNITY): Payer: Self-pay

## 2011-01-25 ENCOUNTER — Encounter (HOSPITAL_COMMUNITY): Payer: Self-pay

## 2011-01-27 ENCOUNTER — Encounter (HOSPITAL_COMMUNITY)
Admission: RE | Admit: 2011-01-27 | Discharge: 2011-01-27 | Disposition: A | Discharge status: Home / Self Care | Payer: Self-pay | Source: Ambulatory Visit | Attending: Cardiovascular Disease | Admitting: Cardiovascular Disease

## 2011-01-27 DIAGNOSIS — Z951 Presence of aortocoronary bypass graft: Secondary | ICD-10-CM | POA: Insufficient documentation

## 2011-01-27 DIAGNOSIS — R5381 Other malaise: Secondary | ICD-10-CM | POA: Insufficient documentation

## 2011-01-27 DIAGNOSIS — Z5189 Encounter for other specified aftercare: Secondary | ICD-10-CM | POA: Insufficient documentation

## 2011-01-27 DIAGNOSIS — E785 Hyperlipidemia, unspecified: Secondary | ICD-10-CM | POA: Insufficient documentation

## 2011-01-27 DIAGNOSIS — I2510 Atherosclerotic heart disease of native coronary artery without angina pectoris: Secondary | ICD-10-CM | POA: Insufficient documentation

## 2011-01-27 DIAGNOSIS — Z7982 Long term (current) use of aspirin: Secondary | ICD-10-CM | POA: Insufficient documentation

## 2011-01-27 DIAGNOSIS — K219 Gastro-esophageal reflux disease without esophagitis: Secondary | ICD-10-CM | POA: Insufficient documentation

## 2011-01-27 DIAGNOSIS — I739 Peripheral vascular disease, unspecified: Secondary | ICD-10-CM | POA: Insufficient documentation

## 2011-01-27 DIAGNOSIS — H548 Legal blindness, as defined in USA: Secondary | ICD-10-CM | POA: Insufficient documentation

## 2011-01-27 DIAGNOSIS — Z954 Presence of other heart-valve replacement: Secondary | ICD-10-CM | POA: Insufficient documentation

## 2011-01-27 DIAGNOSIS — I10 Essential (primary) hypertension: Secondary | ICD-10-CM | POA: Insufficient documentation

## 2011-01-27 DIAGNOSIS — N289 Disorder of kidney and ureter, unspecified: Secondary | ICD-10-CM | POA: Insufficient documentation

## 2011-01-27 DIAGNOSIS — E119 Type 2 diabetes mellitus without complications: Secondary | ICD-10-CM | POA: Insufficient documentation

## 2011-01-27 DIAGNOSIS — I359 Nonrheumatic aortic valve disorder, unspecified: Secondary | ICD-10-CM | POA: Insufficient documentation

## 2011-01-27 DIAGNOSIS — I4891 Unspecified atrial fibrillation: Secondary | ICD-10-CM | POA: Insufficient documentation

## 2011-01-27 DIAGNOSIS — Z87891 Personal history of nicotine dependence: Secondary | ICD-10-CM | POA: Insufficient documentation

## 2011-01-27 DIAGNOSIS — G4733 Obstructive sleep apnea (adult) (pediatric): Secondary | ICD-10-CM | POA: Insufficient documentation

## 2011-01-27 NOTE — Progress Notes (Signed)
Mr Sowash returns to exercise today after 2 week break due to dept closure. Pt's weight today is up 3.0 kg since 01/13/11. Pt expressed surprise that his weight wasn't up more considering what he's been eating. Pt denies SOB, no edema in hands or feet. Pt encouraged to increase his home exercise and resume his healthier eating habits.

## 2011-01-30 ENCOUNTER — Encounter (HOSPITAL_COMMUNITY)
Admission: RE | Admit: 2011-01-30 | Discharge: 2011-01-30 | Disposition: A | Discharge status: Home / Self Care | Payer: Self-pay | Source: Ambulatory Visit | Attending: Cardiovascular Disease | Admitting: Cardiovascular Disease

## 2011-01-31 ENCOUNTER — Telehealth: Payer: Self-pay | Admitting: *Deleted

## 2011-01-31 DIAGNOSIS — D696 Thrombocytopenia, unspecified: Secondary | ICD-10-CM

## 2011-01-31 MED ORDER — OMEPRAZOLE 20 MG PO CPDR: 20.0000 mg | DELAYED_RELEASE_CAPSULE | Freq: Every day | ORAL | Status: DC

## 2011-01-31 MED ORDER — FUROSEMIDE 40 MG PO TABS: 20.0000 mg | ORAL_TABLET | Freq: Every day | ORAL | Status: DC

## 2011-01-31 MED ORDER — ESCITALOPRAM OXALATE 20 MG PO TABS: 20.0000 mg | ORAL_TABLET | Freq: Every day | ORAL | Status: DC

## 2011-01-31 MED ORDER — ROSUVASTATIN CALCIUM 10 MG PO TABS: 10.0000 mg | ORAL_TABLET | Freq: Two times a day (BID) | ORAL | Status: DC

## 2011-01-31 MED ORDER — RANITIDINE HCL 150 MG PO TABS: 75.0000 mg | ORAL_TABLET | ORAL | Status: DC

## 2011-01-31 MED ORDER — METOPROLOL TARTRATE 25 MG PO TABS: 25.0000 mg | ORAL_TABLET | Freq: Two times a day (BID) | ORAL | Status: DC

## 2011-01-31 MED ORDER — ALLOPURINOL 300 MG PO TABS: 300.0000 mg | ORAL_TABLET | Freq: Every day | ORAL | Status: DC

## 2011-01-31 MED ORDER — ROPINIROLE HCL 0.25 MG PO TABS: 0.7500 mg | ORAL_TABLET | Freq: Every day | ORAL | Status: DC

## 2011-01-31 MED ORDER — POTASSIUM CHLORIDE ER 10 MEQ PO TBCR: 10.0000 meq | EXTENDED_RELEASE_TABLET | Freq: Every day | ORAL | Status: DC

## 2011-01-31 MED ORDER — DILTIAZEM HCL ER COATED BEADS 360 MG PO CP24: 360.0000 mg | ORAL_CAPSULE | Freq: Every day | ORAL | Status: DC

## 2011-01-31 NOTE — Telephone Encounter (Signed)
MD received fax from wife stating change insurance needing new rx;s sent to new mail service [employer] [facility] [phone] ID # [id number]. MD oj to send refills...01/31/11@10 :12am/LMB

## 2011-02-01 ENCOUNTER — Encounter (HOSPITAL_COMMUNITY)
Admission: RE | Admit: 2011-02-01 | Discharge: 2011-02-01 | Disposition: A | Discharge status: Home / Self Care | Payer: Self-pay | Source: Ambulatory Visit | Attending: Cardiovascular Disease | Admitting: Cardiovascular Disease

## 2011-02-02 ENCOUNTER — Encounter: Payer: Self-pay | Admitting: Internal Medicine

## 2011-02-03 ENCOUNTER — Encounter (HOSPITAL_COMMUNITY)
Admission: RE | Admit: 2011-02-03 | Discharge: 2011-02-03 | Disposition: A | Discharge status: Home / Self Care | Payer: Self-pay | Source: Ambulatory Visit | Attending: Cardiovascular Disease | Admitting: Cardiovascular Disease

## 2011-02-06 ENCOUNTER — Encounter (HOSPITAL_COMMUNITY)
Admission: RE | Admit: 2011-02-06 | Discharge: 2011-02-06 | Disposition: A | Discharge status: Home / Self Care | Payer: Self-pay | Source: Ambulatory Visit | Attending: Cardiovascular Disease | Admitting: Cardiovascular Disease

## 2011-02-08 ENCOUNTER — Encounter (HOSPITAL_COMMUNITY)
Admission: RE | Admit: 2011-02-08 | Discharge: 2011-02-08 | Disposition: A | Discharge status: Home / Self Care | Payer: Self-pay | Source: Ambulatory Visit | Attending: Cardiovascular Disease | Admitting: Cardiovascular Disease

## 2011-02-10 ENCOUNTER — Encounter (HOSPITAL_COMMUNITY)
Admission: RE | Admit: 2011-02-10 | Discharge: 2011-02-10 | Disposition: A | Discharge status: Home / Self Care | Payer: Self-pay | Source: Ambulatory Visit | Attending: Cardiovascular Disease | Admitting: Cardiovascular Disease

## 2011-02-13 ENCOUNTER — Encounter (HOSPITAL_COMMUNITY)
Admission: RE | Admit: 2011-02-13 | Discharge: 2011-02-13 | Disposition: A | Discharge status: Home / Self Care | Payer: Self-pay | Source: Ambulatory Visit | Attending: Cardiovascular Disease | Admitting: Cardiovascular Disease

## 2011-02-15 ENCOUNTER — Encounter (HOSPITAL_COMMUNITY)
Admission: RE | Admit: 2011-02-15 | Discharge: 2011-02-15 | Disposition: A | Discharge status: Home / Self Care | Payer: Self-pay | Source: Ambulatory Visit | Attending: Cardiovascular Disease | Admitting: Cardiovascular Disease

## 2011-02-17 ENCOUNTER — Encounter (HOSPITAL_COMMUNITY): Payer: Self-pay

## 2011-02-20 ENCOUNTER — Encounter (HOSPITAL_COMMUNITY)
Admission: RE | Admit: 2011-02-20 | Discharge: 2011-02-20 | Disposition: A | Discharge status: Home / Self Care | Payer: Self-pay | Source: Ambulatory Visit | Attending: Cardiovascular Disease | Admitting: Cardiovascular Disease

## 2011-02-22 ENCOUNTER — Encounter (HOSPITAL_COMMUNITY)
Admission: RE | Admit: 2011-02-22 | Discharge: 2011-02-22 | Disposition: A | Discharge status: Home / Self Care | Payer: Self-pay | Source: Ambulatory Visit | Attending: Cardiovascular Disease | Admitting: Cardiovascular Disease

## 2011-02-24 ENCOUNTER — Encounter (HOSPITAL_COMMUNITY)
Admission: RE | Admit: 2011-02-24 | Discharge: 2011-02-24 | Disposition: A | Discharge status: Home / Self Care | Payer: Self-pay | Source: Ambulatory Visit | Attending: Cardiovascular Disease | Admitting: Cardiovascular Disease

## 2011-02-24 DIAGNOSIS — Z954 Presence of other heart-valve replacement: Secondary | ICD-10-CM | POA: Insufficient documentation

## 2011-02-24 DIAGNOSIS — G4733 Obstructive sleep apnea (adult) (pediatric): Secondary | ICD-10-CM | POA: Insufficient documentation

## 2011-02-24 DIAGNOSIS — K219 Gastro-esophageal reflux disease without esophagitis: Secondary | ICD-10-CM | POA: Insufficient documentation

## 2011-02-24 DIAGNOSIS — Z87891 Personal history of nicotine dependence: Secondary | ICD-10-CM | POA: Insufficient documentation

## 2011-02-24 DIAGNOSIS — I4891 Unspecified atrial fibrillation: Secondary | ICD-10-CM | POA: Insufficient documentation

## 2011-02-24 DIAGNOSIS — Z5189 Encounter for other specified aftercare: Secondary | ICD-10-CM | POA: Insufficient documentation

## 2011-02-24 DIAGNOSIS — I2510 Atherosclerotic heart disease of native coronary artery without angina pectoris: Secondary | ICD-10-CM | POA: Insufficient documentation

## 2011-02-24 DIAGNOSIS — R5381 Other malaise: Secondary | ICD-10-CM | POA: Insufficient documentation

## 2011-02-24 DIAGNOSIS — E119 Type 2 diabetes mellitus without complications: Secondary | ICD-10-CM | POA: Insufficient documentation

## 2011-02-24 DIAGNOSIS — I10 Essential (primary) hypertension: Secondary | ICD-10-CM | POA: Insufficient documentation

## 2011-02-24 DIAGNOSIS — E785 Hyperlipidemia, unspecified: Secondary | ICD-10-CM | POA: Insufficient documentation

## 2011-02-24 DIAGNOSIS — H548 Legal blindness, as defined in USA: Secondary | ICD-10-CM | POA: Insufficient documentation

## 2011-02-24 DIAGNOSIS — N289 Disorder of kidney and ureter, unspecified: Secondary | ICD-10-CM | POA: Insufficient documentation

## 2011-02-24 DIAGNOSIS — I739 Peripheral vascular disease, unspecified: Secondary | ICD-10-CM | POA: Insufficient documentation

## 2011-02-24 DIAGNOSIS — I359 Nonrheumatic aortic valve disorder, unspecified: Secondary | ICD-10-CM | POA: Insufficient documentation

## 2011-02-24 DIAGNOSIS — Z951 Presence of aortocoronary bypass graft: Secondary | ICD-10-CM | POA: Insufficient documentation

## 2011-02-24 DIAGNOSIS — Z7982 Long term (current) use of aspirin: Secondary | ICD-10-CM | POA: Insufficient documentation

## 2011-02-27 ENCOUNTER — Encounter (HOSPITAL_COMMUNITY)
Admission: RE | Admit: 2011-02-27 | Discharge: 2011-02-27 | Disposition: A | Discharge status: Home / Self Care | Payer: Self-pay | Source: Ambulatory Visit | Attending: Cardiovascular Disease | Admitting: Cardiovascular Disease

## 2011-03-01 ENCOUNTER — Encounter (HOSPITAL_COMMUNITY)
Admission: RE | Admit: 2011-03-01 | Discharge: 2011-03-01 | Disposition: A | Discharge status: Home / Self Care | Payer: Self-pay | Source: Ambulatory Visit | Attending: Cardiovascular Disease | Admitting: Cardiovascular Disease

## 2011-03-03 ENCOUNTER — Encounter (HOSPITAL_COMMUNITY)
Admission: RE | Admit: 2011-03-03 | Discharge: 2011-03-03 | Disposition: A | Discharge status: Home / Self Care | Payer: Self-pay | Source: Ambulatory Visit | Attending: Cardiovascular Disease | Admitting: Cardiovascular Disease

## 2011-03-06 ENCOUNTER — Encounter (HOSPITAL_COMMUNITY)
Admission: RE | Admit: 2011-03-06 | Discharge: 2011-03-06 | Disposition: A | Discharge status: Home / Self Care | Payer: Self-pay | Source: Ambulatory Visit | Attending: Cardiovascular Disease | Admitting: Cardiovascular Disease

## 2011-03-08 ENCOUNTER — Encounter (HOSPITAL_COMMUNITY)
Admission: RE | Admit: 2011-03-08 | Discharge: 2011-03-08 | Disposition: A | Discharge status: Home / Self Care | Payer: Self-pay | Source: Ambulatory Visit | Attending: Cardiovascular Disease | Admitting: Cardiovascular Disease

## 2011-03-10 ENCOUNTER — Encounter (HOSPITAL_COMMUNITY): Payer: Self-pay

## 2011-03-13 ENCOUNTER — Encounter (HOSPITAL_COMMUNITY): Admission: RE | Admit: 2011-03-13 | Payer: Self-pay | Source: Ambulatory Visit

## 2011-03-14 ENCOUNTER — Ambulatory Visit (INDEPENDENT_AMBULATORY_CARE_PROVIDER_SITE_OTHER): Payer: Medicare Other | Admitting: Internal Medicine

## 2011-03-14 ENCOUNTER — Encounter: Payer: Self-pay | Admitting: Internal Medicine

## 2011-03-14 ENCOUNTER — Telehealth (HOSPITAL_COMMUNITY): Payer: Self-pay | Admitting: *Deleted

## 2011-03-14 VITALS — BP 132/60 | HR 65 | Temp 99.0°F | Wt 231.0 lb

## 2011-03-14 DIAGNOSIS — J019 Acute sinusitis, unspecified: Secondary | ICD-10-CM

## 2011-03-14 DIAGNOSIS — A0472 Enterocolitis due to Clostridium difficile, not specified as recurrent: Secondary | ICD-10-CM

## 2011-03-14 DIAGNOSIS — R635 Abnormal weight gain: Secondary | ICD-10-CM

## 2011-03-14 MED ORDER — AZITHROMYCIN 250 MG PO TABS
ORAL_TABLET | ORAL | Status: AC
Start: 2011-03-14 — End: 2011-03-19

## 2011-03-14 NOTE — Patient Instructions (Signed)
It was good to see you today. Zpak antibiotics - Your prescription(s) have been submitted to your pharmacy. Please take as directed and contact our office if you believe you are having problem(s) with the medication(s). Continue Mucinex for cough and tylenol as needed if your symptoms continue to worsen (pain, fever, etc), or if you are unable take anything by mouth (pills, fluids, etc), you should go to the emergency room for further evaluation and treatment.

## 2011-03-14 NOTE — Assessment & Plan Note (Signed)
History of same 12 2011. Watch for recurrent diarrhea on antibiotic therapy for sinus infection. Reviewed same with wife call if problems

## 2011-03-14 NOTE — Progress Notes (Signed)
  Subjective:    HPI  complains of sinus and cold symptoms  Onset >1 week ago, progressive symptoms  associated with rhinorrhea, sneezing, sore throat, mild headache and low grade fever Also overwhelming fatigue, mild myalgias, sinus pressure and mild chest congestion No relief with OTC meds ? Precipitated by travel and sick contacts - patient has been traveling to and from post with wife because of mother in law's illness and subsequent recent death  Past Medical History  Diagnosis Date  . Adenomatous colon polyp 03/1993  . Prostate cancer   . Depression   . OSA (obstructive sleep apnea)   . Hearing loss   . Atrial fibrillation     not anticoag candidate due to decond and falls  . Legally blind 1987  . AORTIC STENOSIS     s/p AVR 01/2010  . CLOSTRIDIUM DIFFICILE COLITIS 12/30/2009  . CORONARY ARTERY DISEASE     s/p CABG 01/28/10  . HIP REPLACEMENT, RIGHT, HX OF 2006  . HYPERTROPHY PROSTATE W/UR OBST & OTH LUTS   . MRSA infection 04/2010  . Gout   . THROMBOCYTOPENIA   . Arthritis   . DEGENERATIVE DISC DISEASE   . Diabetes mellitus, type 2   . GERD   . Hyperlipidemia   . Hypertension     Review of Systems Constitutional: No fever or night sweats, no unexpected weight change Pulmonary: No pleurisy or hemoptysis Cardiovascular: No chest pain or palpitations     Objective:   Physical Exam BP 132/60  Pulse 65  Temp(Src) 99 F (37.2 C) (Oral)  Wt 231 lb (104.781 kg)  SpO2 93% Wt Readings from Last 3 Encounters:  03/14/11 231 lb (104.781 kg)  01/06/11 229 lb 4.8 oz (104.01 kg)  07/08/10 217 lb 6.4 oz (98.612 kg)   GEN: mildly ill appearing and audible head>chest congestion, wife at side HENT: NCAT, mild sinus tenderness bilaterally, nares with clear discharge, oropharynx mild erythema, no exudate Eyes: Vision grossly intact, no conjunctivitis Lungs: Clear to auscultation without rhonchi or wheeze, no increased work of breathing Cardiovascular: Regular rate and  rhythm, no bilateral edema      Assessment & Plan:  Viral URI >> sinusitis with frontal pressure Cough, postnasal drip related to above Weight gain  - appears euvolemic. Wife frustrated with same. Advised attention to caloric intake and will review further upcoming office visit when acute illness symptoms resolved   Empiric antibiotics prescribed due to symptom duration greater than 7 days and comorbid disease Prescription cough suppression offered but will hold for now given confusion with morphine in the past - uses over-the-counter medications, recommendations made and new prescriptions done Symptomatic care with Tylenol, hydration and rest -  salt gargle advised as needed

## 2011-03-15 ENCOUNTER — Encounter (HOSPITAL_COMMUNITY): Payer: Medicare Other

## 2011-03-17 ENCOUNTER — Encounter (HOSPITAL_COMMUNITY): Payer: Self-pay

## 2011-03-20 ENCOUNTER — Encounter (HOSPITAL_COMMUNITY): Payer: Self-pay

## 2011-03-21 ENCOUNTER — Ambulatory Visit: Payer: Medicare Other | Admitting: Internal Medicine

## 2011-03-22 ENCOUNTER — Ambulatory Visit (INDEPENDENT_AMBULATORY_CARE_PROVIDER_SITE_OTHER): Payer: Medicare Other | Admitting: Internal Medicine

## 2011-03-22 ENCOUNTER — Encounter (HOSPITAL_COMMUNITY): Payer: Medicare Other

## 2011-03-22 ENCOUNTER — Encounter: Payer: Self-pay | Admitting: Internal Medicine

## 2011-03-22 ENCOUNTER — Other Ambulatory Visit (INDEPENDENT_AMBULATORY_CARE_PROVIDER_SITE_OTHER): Payer: Medicare Other

## 2011-03-22 DIAGNOSIS — R5383 Other fatigue: Secondary | ICD-10-CM

## 2011-03-22 DIAGNOSIS — E119 Type 2 diabetes mellitus without complications: Secondary | ICD-10-CM

## 2011-03-22 DIAGNOSIS — I10 Essential (primary) hypertension: Secondary | ICD-10-CM

## 2011-03-22 DIAGNOSIS — D696 Thrombocytopenia, unspecified: Secondary | ICD-10-CM

## 2011-03-22 DIAGNOSIS — E785 Hyperlipidemia, unspecified: Secondary | ICD-10-CM

## 2011-03-22 DIAGNOSIS — R5381 Other malaise: Secondary | ICD-10-CM

## 2011-03-22 LAB — CBC WITH DIFFERENTIAL/PLATELET
Basophils Absolute: 0 10*3/uL (ref 0.0–0.1)
Eosinophils Absolute: 0.1 10*3/uL (ref 0.0–0.7)
Hemoglobin: 15.2 g/dL (ref 13.0–17.0)
Lymphocytes Relative: 8.5 % — ABNORMAL LOW (ref 12.0–46.0)
MCHC: 34.5 g/dL (ref 30.0–36.0)
MCV: 91.8 fl (ref 78.0–100.0)
Monocytes Absolute: 3.3 10*3/uL — ABNORMAL HIGH (ref 0.1–1.0)
Neutro Abs: 5.9 10*3/uL (ref 1.4–7.7)
Neutrophils Relative %: 58 % (ref 43.0–77.0)
RDW: 14.1 % (ref 11.5–14.6)

## 2011-03-22 LAB — LIPID PANEL
Cholesterol: 137 mg/dL (ref 0–200)
HDL: 42.9 mg/dL (ref >39.00)
VLDL: 41.8 mg/dL — ABNORMAL HIGH (ref 0.0–40.0)

## 2011-03-22 LAB — HEMOGLOBIN A1C: Hgb A1c MFr Bld: 5.9 % (ref 4.6–6.5)

## 2011-03-22 NOTE — Assessment & Plan Note (Signed)
Diet controlled Check a1c now and advised attn to weight/diet as ongoing Lab Results  Component Value Date   HGBA1C 5.6 09/13/2010

## 2011-03-22 NOTE — Patient Instructions (Signed)
It was good to see you today. Test(s) ordered today. Your results will be called to you after review (48-72hours after test completion). If any changes need to be made, you will be notified at that time. Medications reviewed, no changes at this time. Please schedule followup in 6 months, call sooner if problems.  

## 2011-03-22 NOTE — Assessment & Plan Note (Signed)
The current medical regimen is effective;  continue present plan and medications.  BP Readings from Last 3 Encounters:  03/22/11 130/62  03/14/11 132/60  01/06/11 150/66

## 2011-03-22 NOTE — Assessment & Plan Note (Signed)
On statin due to hx CAD - check lipids now 

## 2011-03-22 NOTE — Progress Notes (Signed)
Subjective:    Patient ID: [name], male    DOB: 16-Jul-1927, 78 y.o.   MRN: [id number]  HPI  Here for follow up - reviewed chronic med issues:  DM2 - off all meds since 01/2010 - prev on starlix for years - a1c 5.6 preop 01/2010 - does not check cbg at home - no signs or symptoms hyper/hypoglycemia - weight gain reviewed  OSA - follows with pulm/sleep specialist for same - on CPAP, frequently falling asleep in daytime  prostate cancer hx- s/p resection 1993 - follows with uro q6-90mo- ([facility]) - no voiding issues or change in flow  depression - exac by illness and hosp early 2012 - reports compliance with ongoing medical treatment and no changes in medication dose or frequency. denies adverse side effects related to current therapy. Increased fatigue and hypersomnia but denies sadness  Past Medical History  Diagnosis Date  . Adenomatous colon polyp 03/1993  . Prostate cancer   . Depression   . OSA (obstructive sleep apnea)   . Hearing loss   . Atrial fibrillation     not anticoag candidate due to decond and falls  . Legally blind 1987  . AORTIC STENOSIS     s/p AVR 01/2010  . CLOSTRIDIUM DIFFICILE COLITIS 12/30/2009  . CORONARY ARTERY DISEASE     s/p CABG 01/28/10  . HIP REPLACEMENT, RIGHT, HX OF 2006  . HYPERTROPHY PROSTATE W/UR OBST & OTH LUTS   . MRSA infection 04/2010  . Gout   . THROMBOCYTOPENIA   . Arthritis   . DEGENERATIVE DISC DISEASE   . Diabetes mellitus, type 2   . GERD   . Hyperlipidemia   . Hypertension     Review of Systems  Constitutional: Positive for fatigue. Negative for fever and unexpected weight change.  Respiratory: Negative for shortness of breath.   Cardiovascular: Negative for chest pain.  Neurological: Negative for headaches.      Objective:   Physical Exam  BP 130/62  Pulse 79  Temp(Src) 98.3 F (36.8 C) (Oral)  Ht 5\' 10"  (1.778 m)  Wt 231 lb (104.781 kg)  BMI 33.15 kg/m2  SpO2 96% Wt Readings from Last 3 Encounters:    03/22/11 231 lb (104.781 kg)  03/14/11 231 lb (104.781 kg)  01/06/11 229 lb 4.8 oz (104.01 kg)   Constitutional:  he is overweight, appears well-developed and well-nourished. No distress. Wife at side Neck: Normal range of motion. Neck supple. No JVD present. No thyromegaly present.  Cardiovascular: Normal rate, regular rhythm and normal heart sounds.  No murmur heard. no edema BLE Pulmonary/Chest: Effort normal and breath sounds normal. No respiratory distress. no wheezes.  Abd: SNTND, +BS, no mass Neurological: he is alert and oriented to person, place, and time. No cranial nerve deficit. Coordination normal.  Psychiatric: he has a dysthmic mood and affect. behavior is normal. Judgment and thought content normal.   Lab Results  Component Value Date   WBC 6.0 01/06/2011   HGB 14.8 01/06/2011   HCT 42.2 01/06/2011   PLT 85* 01/06/2011   CHOL 138 09/13/2010   TRIG 74.0 09/13/2010   HDL 57.70 09/13/2010   ALT 21 01/06/2011   AST 20 01/06/2011   NA 140 01/06/2011   K 4.4 01/06/2011   CL 101 01/06/2011   CREATININE 1.09 01/06/2011   BUN 14 01/06/2011   CO2 30 01/06/2011   TSH 1.65 09/13/2010   PSA 0.21 09/13/2010   INR 1.04 12/05/2010   HGBA1C 5.6 09/13/2010  Assessment & Plan:   see problem list. Medications and labs reviewed today.  Fatigue - ?overlap with depression vs other problem - check labs, consider change in SSRI therapy - to see counselor 03/2011 and will review/consider same prn if labs "ok"

## 2011-03-23 ENCOUNTER — Other Ambulatory Visit: Payer: Self-pay | Admitting: Oncology

## 2011-03-23 ENCOUNTER — Telehealth: Payer: Self-pay | Admitting: Oncology

## 2011-03-23 DIAGNOSIS — D72821 Monocytosis (symptomatic): Secondary | ICD-10-CM

## 2011-03-23 DIAGNOSIS — D696 Thrombocytopenia, unspecified: Secondary | ICD-10-CM

## 2011-03-23 NOTE — Telephone Encounter (Signed)
called pts home s/w and scheduled appt for 03/05.

## 2011-03-24 ENCOUNTER — Encounter (HOSPITAL_COMMUNITY)
Admission: RE | Admit: 2011-03-24 | Discharge: 2011-03-24 | Disposition: A | Discharge status: Home / Self Care | Payer: Self-pay | Source: Ambulatory Visit | Attending: Cardiovascular Disease | Admitting: Cardiovascular Disease

## 2011-03-24 DIAGNOSIS — E785 Hyperlipidemia, unspecified: Secondary | ICD-10-CM | POA: Insufficient documentation

## 2011-03-24 DIAGNOSIS — I10 Essential (primary) hypertension: Secondary | ICD-10-CM | POA: Insufficient documentation

## 2011-03-24 DIAGNOSIS — R5381 Other malaise: Secondary | ICD-10-CM | POA: Insufficient documentation

## 2011-03-24 DIAGNOSIS — I359 Nonrheumatic aortic valve disorder, unspecified: Secondary | ICD-10-CM | POA: Insufficient documentation

## 2011-03-24 DIAGNOSIS — I2510 Atherosclerotic heart disease of native coronary artery without angina pectoris: Secondary | ICD-10-CM | POA: Insufficient documentation

## 2011-03-24 DIAGNOSIS — Z954 Presence of other heart-valve replacement: Secondary | ICD-10-CM | POA: Insufficient documentation

## 2011-03-24 DIAGNOSIS — I4891 Unspecified atrial fibrillation: Secondary | ICD-10-CM | POA: Insufficient documentation

## 2011-03-24 DIAGNOSIS — N289 Disorder of kidney and ureter, unspecified: Secondary | ICD-10-CM | POA: Insufficient documentation

## 2011-03-24 DIAGNOSIS — I739 Peripheral vascular disease, unspecified: Secondary | ICD-10-CM | POA: Insufficient documentation

## 2011-03-24 DIAGNOSIS — Z87891 Personal history of nicotine dependence: Secondary | ICD-10-CM | POA: Insufficient documentation

## 2011-03-24 DIAGNOSIS — Z951 Presence of aortocoronary bypass graft: Secondary | ICD-10-CM | POA: Insufficient documentation

## 2011-03-24 DIAGNOSIS — Z7982 Long term (current) use of aspirin: Secondary | ICD-10-CM | POA: Insufficient documentation

## 2011-03-24 DIAGNOSIS — Z5189 Encounter for other specified aftercare: Secondary | ICD-10-CM | POA: Insufficient documentation

## 2011-03-24 DIAGNOSIS — K219 Gastro-esophageal reflux disease without esophagitis: Secondary | ICD-10-CM | POA: Insufficient documentation

## 2011-03-24 DIAGNOSIS — G4733 Obstructive sleep apnea (adult) (pediatric): Secondary | ICD-10-CM | POA: Insufficient documentation

## 2011-03-24 DIAGNOSIS — H548 Legal blindness, as defined in USA: Secondary | ICD-10-CM | POA: Insufficient documentation

## 2011-03-24 DIAGNOSIS — E119 Type 2 diabetes mellitus without complications: Secondary | ICD-10-CM | POA: Insufficient documentation

## 2011-03-27 ENCOUNTER — Encounter (HOSPITAL_COMMUNITY)
Admission: RE | Admit: 2011-03-27 | Discharge: 2011-03-27 | Disposition: A | Discharge status: Home / Self Care | Payer: Self-pay | Source: Ambulatory Visit | Attending: Cardiovascular Disease | Admitting: Cardiovascular Disease

## 2011-03-28 ENCOUNTER — Telehealth: Payer: Self-pay | Admitting: Oncology

## 2011-03-28 ENCOUNTER — Other Ambulatory Visit: Payer: Self-pay | Admitting: Oncology

## 2011-03-28 ENCOUNTER — Other Ambulatory Visit: Payer: Medicare Other

## 2011-03-28 ENCOUNTER — Ambulatory Visit (HOSPITAL_BASED_OUTPATIENT_CLINIC_OR_DEPARTMENT_OTHER): Payer: Medicare Other | Admitting: Oncology

## 2011-03-28 ENCOUNTER — Other Ambulatory Visit: Payer: Medicare Other | Admitting: Lab

## 2011-03-28 ENCOUNTER — Ambulatory Visit: Payer: Medicare Other | Admitting: Oncology

## 2011-03-28 DIAGNOSIS — D696 Thrombocytopenia, unspecified: Secondary | ICD-10-CM

## 2011-03-28 DIAGNOSIS — D72821 Monocytosis (symptomatic): Secondary | ICD-10-CM | POA: Insufficient documentation

## 2011-03-28 LAB — MORPHOLOGY

## 2011-03-28 LAB — CBC WITH DIFFERENTIAL/PLATELET
BASO%: 0.2 % (ref 0.0–2.0)
Basophils Absolute: 0 10*3/uL (ref 0.0–0.1)
EOS%: 0.8 % (ref 0.0–7.0)
HGB: 14.7 g/dL (ref 13.0–17.1)
MCH: 31.3 pg (ref 27.2–33.4)
MCHC: 34 g/dL (ref 32.0–36.0)
MCV: 91.9 fL (ref 79.3–98.0)
MONO%: 34.6 % — ABNORMAL HIGH (ref 0.0–14.0)
RBC: 4.71 10*6/uL (ref 4.20–5.82)
RDW: 14 % (ref 11.0–14.6)

## 2011-03-28 LAB — CHCC SMEAR

## 2011-03-28 NOTE — Telephone Encounter (Signed)
appts made and printed   aom 

## 2011-03-28 NOTE — Progress Notes (Signed)
[facility] OFFICE PROGRESS NOTE  Cc:   Rene Paci, MD.   DIAGNOSIS:  Isolated thrombocytopenia, most likely immune mediated thrombocytopenia.  Extensive work up including bone marrow biopsy was negative.   CURRENT THERAPY:  Watchful observation.   INTERVAL HISTORY: [name] 78 y.o. male returns for regular follow up with his wife. For these past few weeks, he has been having problem with nasal congestion, sinus pain without fever.  He was seen by his PCP and given a course of Z-pak with slight improvement of his symptoms.  He still has not much stamina.  This has been a problem ever since he was diagnosed with CAD.  However, he still has good appetite and weight stability.  He attends cardiac rehab 3 times a week and aqua therapy 2 times a week.  He denies SOB, chest pain.  He was recently seen by his PCP and was found to have monocytosis with absolute mono of more than 3.  He was thus kindly referred for evaluation.   Patient denies headache, visual changes, confusion, drenching night sweats, palpable lymph node swelling, mucositis, odynophagia, dysphagia, nausea vomiting, jaundice, chest pain, palpitation, gum bleeding, epistaxis, hematemesis, hemoptysis, abdominal pain, abdominal swelling, early satiety, melena, hematochezia, hematuria, skin rash, spontaneous bleeding, joint swelling, joint pain, heat or cold intolerance, bowel bladder incontinence, back pain, focal motor weakness, paresthesia, depression, suicidal or homocidal ideation, feeling hopelessness.   MEDICAL HISTORY: Past Medical History  Diagnosis Date  . Adenomatous colon polyp 03/1993  . Prostate cancer   . Depression   . OSA (obstructive sleep apnea)   . Hearing loss   . Atrial fibrillation     not anticoag candidate due to decond and falls  . Legally blind 1987  . AORTIC STENOSIS     s/p AVR 01/2010  . CLOSTRIDIUM DIFFICILE COLITIS 12/30/2009  . CORONARY ARTERY DISEASE     s/p CABG 01/28/10    . HIP REPLACEMENT, RIGHT, HX OF 2006  . HYPERTROPHY PROSTATE W/UR OBST & OTH LUTS   . MRSA infection 04/2010  . Gout   . THROMBOCYTOPENIA   . Arthritis   . DEGENERATIVE DISC DISEASE   . Diabetes mellitus, type 2   . GERD   . Hyperlipidemia   . Hypertension     SURGICAL HISTORY:  Past Surgical History  Procedure Date  . Coronary artery bypass graft 01/28/2010    x3  . Aortic valve replacement 01/28/2010  . Prostatectomy 1993  . Hernia repair 03/15/2009    left  . Anal sphincterotomy 1996  . Total hip arthroplasty 2006    left  . Cataract extraction, bilateral     4/11 & 5/11  . Cholecystectomy 2011    MEDICATIONS: Current Outpatient Prescriptions  Medication Sig Dispense Refill  . acetaminophen (TYLENOL) 500 MG tablet Take 1,000 mg by mouth daily.        Marland Kitchen allopurinol (ZYLOPRIM) 300 MG tablet Take 1 tablet (300 mg total) by mouth at bedtime.  90 tablet  2  . Ascorbic Acid (VITAMIN C PO) Take 1 tablet by mouth every morning.        Marland Kitchen aspirin 325 MG tablet Take 325 mg by mouth every morning.       Marland Kitchen CALCIUM PO Take 1 tablet by mouth every morning.        . cetirizine (ZYRTEC) 10 MG tablet Take 10 mg by mouth daily.        . Cholecalciferol (VITAMIN D-3 PO) Take 2,000  Units by mouth daily.        Marland Kitchen diltiazem (CARDIZEM CD) 360 MG 24 hr capsule Take 1 capsule (360 mg total) by mouth at bedtime.  90 capsule  2  . docusate sodium (COLACE) 100 MG capsule Take 100 mg by mouth daily.        Marland Kitchen escitalopram (LEXAPRO) 20 MG tablet Take 1 tablet (20 mg total) by mouth daily.  90 tablet  2  . furosemide (LASIX) 40 MG tablet Take 0.5 tablets (20 mg total) by mouth daily.  45 tablet  2  . metoprolol tartrate (LOPRESSOR) 25 MG tablet Take 1 tablet (25 mg total) by mouth 2 (two) times daily.  180 tablet  2  . Multiple Vitamins-Minerals (ICAPS MV PO) Take 1 tablet by mouth every morning.        . Multiple Vitamins-Minerals (MULTIVITAMINS THER. W/MINERALS) TABS Take 1 tablet by mouth every  morning.        Marland Kitchen omeprazole (PRILOSEC) 20 MG capsule Take 1 capsule (20 mg total) by mouth daily after breakfast.  90 capsule  2  . polysaccharide iron (NIFEREX) 150 MG CAPS capsule Take 150 mg by mouth daily.        . potassium chloride (K-DUR) 10 MEQ tablet Take 1 tablet (10 mEq total) by mouth daily.  90 tablet  2  . Pyridoxine HCl (VITAMIN B-6 PO) Take 1 tablet by mouth every morning.        . ranitidine (ZANTAC) 150 MG tablet Take 0.5 tablets (75 mg total) by mouth every morning.  45 tablet  2  . rOPINIRole (REQUIP) 0.25 MG tablet Take 3 tablets (0.75 mg total) by mouth at bedtime.  270 tablet  2  . rosuvastatin (CRESTOR) 10 MG tablet Take 1 tablet (10 mg total) by mouth 2 (two) times daily.  180 tablet  2  . Sertaconazole Nitrate 2 % CREA Apply topically as needed. Athletes foot       . Thiamine HCl (VITAMIN B-1 PO) Take 1 tablet by mouth every morning.          ALLERGIES:  is allergic to doxycycline; septra; and morphine.  REVIEW OF SYSTEMS:  The rest of the 14-point review of system was negative.   Filed Vitals:   03/28/11 0851  BP: 147/67  Pulse: 67  Temp: 98.7 F (37.1 C)   Wt Readings from Last 3 Encounters:  03/28/11 229 lb 4.8 oz (104.01 kg)  03/22/11 231 lb (104.781 kg)  03/14/11 231 lb (104.781 kg)   ECOG Performance status: 1.   PHYSICAL EXAMINATION:   General:  Mildly obese male in no acute distress.  Eyes:  no scleral icterus.  ENT:  There were no oropharyngeal lesions.  Neck was without thyromegaly.  Lymphatics:  Negative cervical, supraclavicular or axillary adenopathy.  Respiratory: lungs were clear bilaterally without wheezing or crackles.  Cardiovascular:  Regular rate and rhythm, S1/S2, without murmur, rub or gallop.  There was no pedal edema.  GI:  abdomen was soft, flat, nontender, nondistended, without organomegaly.  Muscoloskeletal:  no spinal tenderness of palpation of vertebral spine.  Skin exam was without echymosis, petichae.  Neuro exam was nonfocal.   Patient needed minimal assistance to get on and off exam table.  Gait was normal.  Patient was alerted and oriented.  Attention was good.   Language was appropriate.  Mood was normal without depression.  Speech was not pressured.  Thought content was not tangential.    LABORATORY/RADIOLOGY DATA:  Lab Results  Component Value Date   WBC 7.6 03/28/2011   HGB 14.7 03/28/2011   HCT 43.3 03/28/2011   PLT 108* 03/28/2011   GLUCOSE 99 01/06/2011   CHOL 137 03/22/2011   TRIG 209.0* 03/22/2011   HDL 42.90 03/22/2011   LDLDIRECT 73.0 03/22/2011   LDLCALC 66 09/13/2010   ALT 21 01/06/2011   AST 20 01/06/2011   NA 140 01/06/2011   K 4.4 01/06/2011   CL 101 01/06/2011   CREATININE 1.09 01/06/2011   BUN 14 01/06/2011   CO2 30 01/06/2011   TSH 1.08 03/22/2011   PSA 0.21 09/13/2010   INR 1.04 12/05/2010   HGBA1C 5.9 03/22/2011    I personally reviewed the patient's peripheral blood smear today.  There was isocytosis.  There was no peripheral blast.  There was no schistocytosis, spherocytosis, target cell, rouleaux formation, tear drop cell.  There was no giant platelets or platelet clumps.  There was increased in peripheral monocytosis but no obvious immature blast.     ASSESSMENT AND PLAN:   1. Chronic thrombocytopenia:  Unclear etiology.  Most likely mild ITP.  Extensive work up was negative for consumptive process, portal hypertension, primary bone marrow failure.  His plt today improved compared to before.  As his platelet is still >30K, there is no risk of bleeding with this mild ITP.  Therefore, I again recommended to observe.  2. Monocytosis:   Given worsening of his mild monocytosis before, I recommended further work up to rule out CML and AML with BCR/ABL and flow cytometry.  He does not have severe B-symptoms or exam finding consistent with lymphoproliferative disease.  Beside his resolving URI, he does not have obvious chronic infection.  If work up for [facility] and AML are negative, then most likely this  is either reactive or chronic myelomonocytic leukemia (CMML).  The treatment of CMML would be observation unless he develops severe leukocytosis in the future.  I recommended repeating [facility] in 2 months.  I will see him again in 4 months to ascertain stability of his monocytosis.  If it significantly worsens, I may consider repeating diagnostic bone marrow biopsy.  3. Coronary artery disease:  S/p CABG in 2011.   He is on aspirin, diltiazem, metoprolol, and Crestor.  4. History of gout:   He is on allopurinol. 5. Borderline Diabetes mellitus, type II:  Diet control per PCP.  6. Hypertension:  Well controlled on metoprolol, Lasix, and Cardizem per PCP. 7. Hyperlipidemia:  He is on Crestor per PCP. 8. Gastroesophageal reflux disease:  He is on omeprazole.  9. Dyspnea on exertion:  Most likely due to deconditioning.  Per family, extensive work up was negative for cardiac, pulm causes. He is undergoing cardiac rehab.  10. Follow up:  CBC in 2 months.  Lab/RV with me in 4 months.

## 2011-03-29 ENCOUNTER — Encounter (HOSPITAL_COMMUNITY)
Admission: RE | Admit: 2011-03-29 | Discharge: 2011-03-29 | Disposition: A | Discharge status: Home / Self Care | Payer: Self-pay | Source: Ambulatory Visit | Attending: Cardiovascular Disease | Admitting: Cardiovascular Disease

## 2011-03-31 ENCOUNTER — Encounter (HOSPITAL_COMMUNITY)
Admission: RE | Admit: 2011-03-31 | Discharge: 2011-03-31 | Disposition: A | Discharge status: Home / Self Care | Payer: Self-pay | Source: Ambulatory Visit | Attending: Cardiovascular Disease | Admitting: Cardiovascular Disease

## 2011-04-03 ENCOUNTER — Encounter (HOSPITAL_COMMUNITY)
Admission: RE | Admit: 2011-04-03 | Discharge: 2011-04-03 | Disposition: A | Discharge status: Home / Self Care | Payer: Self-pay | Source: Ambulatory Visit | Attending: Cardiovascular Disease | Admitting: Cardiovascular Disease

## 2011-04-03 LAB — BCR/ABL (LIO MMD)

## 2011-04-04 ENCOUNTER — Encounter: Payer: Self-pay | Admitting: Pulmonary Disease

## 2011-04-05 ENCOUNTER — Encounter (HOSPITAL_COMMUNITY)
Admission: RE | Admit: 2011-04-05 | Discharge: 2011-04-05 | Disposition: A | Discharge status: Home / Self Care | Payer: Self-pay | Source: Ambulatory Visit | Attending: Cardiovascular Disease | Admitting: Cardiovascular Disease

## 2011-04-07 ENCOUNTER — Encounter (HOSPITAL_COMMUNITY)
Admission: RE | Admit: 2011-04-07 | Discharge: 2011-04-07 | Disposition: A | Discharge status: Home / Self Care | Payer: Self-pay | Source: Ambulatory Visit | Attending: Cardiovascular Disease | Admitting: Cardiovascular Disease

## 2011-04-10 ENCOUNTER — Encounter (HOSPITAL_COMMUNITY)
Admission: RE | Admit: 2011-04-10 | Discharge: 2011-04-10 | Disposition: A | Discharge status: Home / Self Care | Payer: Self-pay | Source: Ambulatory Visit | Attending: Cardiovascular Disease | Admitting: Cardiovascular Disease

## 2011-04-12 ENCOUNTER — Encounter (HOSPITAL_COMMUNITY)
Admission: RE | Admit: 2011-04-12 | Discharge: 2011-04-12 | Disposition: A | Discharge status: Home / Self Care | Payer: Self-pay | Source: Ambulatory Visit | Attending: Cardiovascular Disease | Admitting: Cardiovascular Disease

## 2011-04-14 ENCOUNTER — Encounter (HOSPITAL_COMMUNITY)
Admission: RE | Admit: 2011-04-14 | Discharge: 2011-04-14 | Disposition: A | Discharge status: Home / Self Care | Payer: Self-pay | Source: Ambulatory Visit | Attending: Cardiovascular Disease | Admitting: Cardiovascular Disease

## 2011-04-17 ENCOUNTER — Encounter (HOSPITAL_COMMUNITY): Payer: Self-pay

## 2011-04-19 ENCOUNTER — Encounter (HOSPITAL_COMMUNITY)
Admission: RE | Admit: 2011-04-19 | Discharge: 2011-04-19 | Disposition: A | Discharge status: Home / Self Care | Payer: Self-pay | Source: Ambulatory Visit | Attending: Cardiovascular Disease | Admitting: Cardiovascular Disease

## 2011-04-21 ENCOUNTER — Encounter (HOSPITAL_COMMUNITY)
Admission: RE | Admit: 2011-04-21 | Discharge: 2011-04-21 | Disposition: A | Discharge status: Home / Self Care | Payer: Self-pay | Source: Ambulatory Visit | Attending: Cardiovascular Disease | Admitting: Cardiovascular Disease

## 2011-04-24 ENCOUNTER — Encounter (HOSPITAL_COMMUNITY)
Admission: RE | Admit: 2011-04-24 | Discharge: 2011-04-24 | Disposition: A | Discharge status: Home / Self Care | Payer: Self-pay | Source: Ambulatory Visit | Attending: Cardiovascular Disease | Admitting: Cardiovascular Disease

## 2011-04-24 DIAGNOSIS — Z5189 Encounter for other specified aftercare: Secondary | ICD-10-CM | POA: Insufficient documentation

## 2011-04-24 DIAGNOSIS — E119 Type 2 diabetes mellitus without complications: Secondary | ICD-10-CM | POA: Insufficient documentation

## 2011-04-24 DIAGNOSIS — I739 Peripheral vascular disease, unspecified: Secondary | ICD-10-CM | POA: Insufficient documentation

## 2011-04-24 DIAGNOSIS — Z87891 Personal history of nicotine dependence: Secondary | ICD-10-CM | POA: Insufficient documentation

## 2011-04-24 DIAGNOSIS — G4733 Obstructive sleep apnea (adult) (pediatric): Secondary | ICD-10-CM | POA: Insufficient documentation

## 2011-04-24 DIAGNOSIS — E785 Hyperlipidemia, unspecified: Secondary | ICD-10-CM | POA: Insufficient documentation

## 2011-04-24 DIAGNOSIS — H548 Legal blindness, as defined in USA: Secondary | ICD-10-CM | POA: Insufficient documentation

## 2011-04-24 DIAGNOSIS — I2510 Atherosclerotic heart disease of native coronary artery without angina pectoris: Secondary | ICD-10-CM | POA: Insufficient documentation

## 2011-04-24 DIAGNOSIS — I10 Essential (primary) hypertension: Secondary | ICD-10-CM | POA: Insufficient documentation

## 2011-04-24 DIAGNOSIS — Z954 Presence of other heart-valve replacement: Secondary | ICD-10-CM | POA: Insufficient documentation

## 2011-04-24 DIAGNOSIS — I359 Nonrheumatic aortic valve disorder, unspecified: Secondary | ICD-10-CM | POA: Insufficient documentation

## 2011-04-24 DIAGNOSIS — K219 Gastro-esophageal reflux disease without esophagitis: Secondary | ICD-10-CM | POA: Insufficient documentation

## 2011-04-24 DIAGNOSIS — Z7982 Long term (current) use of aspirin: Secondary | ICD-10-CM | POA: Insufficient documentation

## 2011-04-24 DIAGNOSIS — Z951 Presence of aortocoronary bypass graft: Secondary | ICD-10-CM | POA: Insufficient documentation

## 2011-04-24 DIAGNOSIS — R5381 Other malaise: Secondary | ICD-10-CM | POA: Insufficient documentation

## 2011-04-24 DIAGNOSIS — N289 Disorder of kidney and ureter, unspecified: Secondary | ICD-10-CM | POA: Insufficient documentation

## 2011-04-24 DIAGNOSIS — I4891 Unspecified atrial fibrillation: Secondary | ICD-10-CM | POA: Insufficient documentation

## 2011-04-26 ENCOUNTER — Encounter (HOSPITAL_COMMUNITY)
Admission: RE | Admit: 2011-04-26 | Discharge: 2011-04-26 | Disposition: A | Discharge status: Home / Self Care | Payer: Self-pay | Source: Ambulatory Visit | Attending: Cardiovascular Disease | Admitting: Cardiovascular Disease

## 2011-04-28 ENCOUNTER — Encounter (HOSPITAL_COMMUNITY)
Admission: RE | Admit: 2011-04-28 | Discharge: 2011-04-28 | Disposition: A | Discharge status: Home / Self Care | Payer: Self-pay | Source: Ambulatory Visit | Attending: Cardiovascular Disease | Admitting: Cardiovascular Disease

## 2011-05-01 ENCOUNTER — Encounter (HOSPITAL_COMMUNITY)
Admission: RE | Admit: 2011-05-01 | Discharge: 2011-05-01 | Disposition: A | Discharge status: Home / Self Care | Payer: Self-pay | Source: Ambulatory Visit | Attending: Cardiovascular Disease | Admitting: Cardiovascular Disease

## 2011-05-03 ENCOUNTER — Encounter (HOSPITAL_COMMUNITY)
Admission: RE | Admit: 2011-05-03 | Discharge: 2011-05-03 | Disposition: A | Discharge status: Home / Self Care | Payer: Self-pay | Source: Ambulatory Visit | Attending: Cardiovascular Disease | Admitting: Cardiovascular Disease

## 2011-05-05 ENCOUNTER — Encounter (HOSPITAL_COMMUNITY)
Admission: RE | Admit: 2011-05-05 | Discharge: 2011-05-05 | Disposition: A | Discharge status: Home / Self Care | Payer: Self-pay | Source: Ambulatory Visit | Attending: Cardiovascular Disease | Admitting: Cardiovascular Disease

## 2011-05-05 ENCOUNTER — Other Ambulatory Visit: Payer: Medicare Other

## 2011-05-08 ENCOUNTER — Encounter (HOSPITAL_COMMUNITY)
Admission: RE | Admit: 2011-05-08 | Discharge: 2011-05-08 | Disposition: A | Discharge status: Home / Self Care | Payer: Self-pay | Source: Ambulatory Visit | Attending: Cardiovascular Disease | Admitting: Cardiovascular Disease

## 2011-05-10 ENCOUNTER — Encounter (HOSPITAL_COMMUNITY)
Admission: RE | Admit: 2011-05-10 | Discharge: 2011-05-10 | Disposition: A | Discharge status: Home / Self Care | Payer: Self-pay | Source: Ambulatory Visit | Attending: Cardiovascular Disease | Admitting: Cardiovascular Disease

## 2011-05-12 ENCOUNTER — Encounter (HOSPITAL_COMMUNITY)
Admission: RE | Admit: 2011-05-12 | Discharge: 2011-05-12 | Disposition: A | Discharge status: Home / Self Care | Payer: Self-pay | Source: Ambulatory Visit | Attending: Cardiovascular Disease | Admitting: Cardiovascular Disease

## 2011-05-15 ENCOUNTER — Encounter (HOSPITAL_COMMUNITY)
Admission: RE | Admit: 2011-05-15 | Discharge: 2011-05-15 | Disposition: A | Discharge status: Home / Self Care | Payer: Self-pay | Source: Ambulatory Visit | Attending: Cardiovascular Disease | Admitting: Cardiovascular Disease

## 2011-05-17 ENCOUNTER — Encounter (HOSPITAL_COMMUNITY)
Admission: RE | Admit: 2011-05-17 | Discharge: 2011-05-17 | Disposition: A | Discharge status: Home / Self Care | Payer: Self-pay | Source: Ambulatory Visit | Attending: Cardiovascular Disease | Admitting: Cardiovascular Disease

## 2011-05-19 ENCOUNTER — Encounter (HOSPITAL_COMMUNITY)
Admission: RE | Admit: 2011-05-19 | Discharge: 2011-05-19 | Disposition: A | Discharge status: Home / Self Care | Payer: Self-pay | Source: Ambulatory Visit | Attending: Cardiovascular Disease | Admitting: Cardiovascular Disease

## 2011-05-22 ENCOUNTER — Encounter (HOSPITAL_COMMUNITY)
Admission: RE | Admit: 2011-05-22 | Discharge: 2011-05-22 | Disposition: A | Discharge status: Home / Self Care | Payer: Self-pay | Source: Ambulatory Visit | Attending: Cardiovascular Disease | Admitting: Cardiovascular Disease

## 2011-05-24 ENCOUNTER — Encounter (HOSPITAL_COMMUNITY)
Admission: RE | Admit: 2011-05-24 | Discharge: 2011-05-24 | Disposition: A | Discharge status: Home / Self Care | Payer: Self-pay | Source: Ambulatory Visit | Attending: Cardiovascular Disease | Admitting: Cardiovascular Disease

## 2011-05-24 DIAGNOSIS — Z7982 Long term (current) use of aspirin: Secondary | ICD-10-CM | POA: Insufficient documentation

## 2011-05-24 DIAGNOSIS — I2510 Atherosclerotic heart disease of native coronary artery without angina pectoris: Secondary | ICD-10-CM | POA: Insufficient documentation

## 2011-05-24 DIAGNOSIS — N289 Disorder of kidney and ureter, unspecified: Secondary | ICD-10-CM | POA: Insufficient documentation

## 2011-05-24 DIAGNOSIS — G4733 Obstructive sleep apnea (adult) (pediatric): Secondary | ICD-10-CM | POA: Insufficient documentation

## 2011-05-24 DIAGNOSIS — Z951 Presence of aortocoronary bypass graft: Secondary | ICD-10-CM | POA: Insufficient documentation

## 2011-05-24 DIAGNOSIS — Z5189 Encounter for other specified aftercare: Secondary | ICD-10-CM | POA: Insufficient documentation

## 2011-05-24 DIAGNOSIS — R5381 Other malaise: Secondary | ICD-10-CM | POA: Insufficient documentation

## 2011-05-24 DIAGNOSIS — I4891 Unspecified atrial fibrillation: Secondary | ICD-10-CM | POA: Insufficient documentation

## 2011-05-24 DIAGNOSIS — Z954 Presence of other heart-valve replacement: Secondary | ICD-10-CM | POA: Insufficient documentation

## 2011-05-24 DIAGNOSIS — I739 Peripheral vascular disease, unspecified: Secondary | ICD-10-CM | POA: Insufficient documentation

## 2011-05-24 DIAGNOSIS — I359 Nonrheumatic aortic valve disorder, unspecified: Secondary | ICD-10-CM | POA: Insufficient documentation

## 2011-05-24 DIAGNOSIS — E785 Hyperlipidemia, unspecified: Secondary | ICD-10-CM | POA: Insufficient documentation

## 2011-05-24 DIAGNOSIS — E119 Type 2 diabetes mellitus without complications: Secondary | ICD-10-CM | POA: Insufficient documentation

## 2011-05-24 DIAGNOSIS — K219 Gastro-esophageal reflux disease without esophagitis: Secondary | ICD-10-CM | POA: Insufficient documentation

## 2011-05-24 DIAGNOSIS — I10 Essential (primary) hypertension: Secondary | ICD-10-CM | POA: Insufficient documentation

## 2011-05-24 DIAGNOSIS — H548 Legal blindness, as defined in USA: Secondary | ICD-10-CM | POA: Insufficient documentation

## 2011-05-24 DIAGNOSIS — Z87891 Personal history of nicotine dependence: Secondary | ICD-10-CM | POA: Insufficient documentation

## 2011-05-26 ENCOUNTER — Encounter (HOSPITAL_COMMUNITY)
Admission: RE | Admit: 2011-05-26 | Discharge: 2011-05-26 | Disposition: A | Discharge status: Home / Self Care | Payer: Self-pay | Source: Ambulatory Visit | Attending: Cardiovascular Disease | Admitting: Cardiovascular Disease

## 2011-05-29 ENCOUNTER — Other Ambulatory Visit (HOSPITAL_BASED_OUTPATIENT_CLINIC_OR_DEPARTMENT_OTHER): Payer: Medicare Other | Admitting: Lab

## 2011-05-29 ENCOUNTER — Encounter (HOSPITAL_COMMUNITY)
Admission: RE | Admit: 2011-05-29 | Discharge: 2011-05-29 | Disposition: A | Discharge status: Home / Self Care | Payer: Self-pay | Source: Ambulatory Visit | Attending: Cardiovascular Disease | Admitting: Cardiovascular Disease

## 2011-05-29 ENCOUNTER — Telehealth: Payer: Self-pay | Admitting: *Deleted

## 2011-05-29 DIAGNOSIS — D72821 Monocytosis (symptomatic): Secondary | ICD-10-CM

## 2011-05-29 DIAGNOSIS — D696 Thrombocytopenia, unspecified: Secondary | ICD-10-CM

## 2011-05-29 LAB — CBC WITH DIFFERENTIAL/PLATELET
Basophils Absolute: 0 10*3/uL (ref 0.0–0.1)
Eosinophils Absolute: 0 10*3/uL (ref 0.0–0.5)
HCT: 42.7 % (ref 38.4–49.9)
HGB: 14.6 g/dL (ref 13.0–17.1)
MONO#: 2.5 10*3/uL — ABNORMAL HIGH (ref 0.1–0.9)
NEUT#: 2.5 10*3/uL (ref 1.5–6.5)
NEUT%: 41.5 % (ref 39.0–75.0)
RDW: 14.4 % (ref 11.0–14.6)
WBC: 6 10*3/uL (ref 4.0–10.3)
lymph#: 1 10*3/uL (ref 0.9–3.3)

## 2011-05-29 NOTE — Telephone Encounter (Signed)
Spoke w/ wife and informed her of lab results, Monocytes still elevated but stable and plt slightly lower but stable and to keep appt in July as scheduled.  She verbalized understanding.

## 2011-05-29 NOTE — Telephone Encounter (Signed)
Message copied by Wende Mott on Mon May 29, 2011  5:03 PM ------      Message from: HA, Raliegh Ip T      Created: Mon May 29, 2011 12:44 PM       Please call pt's wife.  His monocyte is still elevated; but stable (negative work up so far for monocytosis).  His plt is slightly down but is stable compared to last year.  Will continue observation.  Thanks.

## 2011-05-31 ENCOUNTER — Encounter (HOSPITAL_COMMUNITY): Payer: Self-pay

## 2011-06-02 ENCOUNTER — Encounter (HOSPITAL_COMMUNITY): Payer: Self-pay

## 2011-06-05 ENCOUNTER — Encounter (HOSPITAL_COMMUNITY)
Admission: RE | Admit: 2011-06-05 | Discharge: 2011-06-05 | Disposition: A | Discharge status: Home / Self Care | Payer: Self-pay | Source: Ambulatory Visit | Attending: Cardiovascular Disease | Admitting: Cardiovascular Disease

## 2011-06-07 ENCOUNTER — Encounter (HOSPITAL_COMMUNITY)
Admission: RE | Admit: 2011-06-07 | Discharge: 2011-06-07 | Disposition: A | Discharge status: Home / Self Care | Payer: Self-pay | Source: Ambulatory Visit | Attending: Cardiovascular Disease | Admitting: Cardiovascular Disease

## 2011-06-09 ENCOUNTER — Encounter (HOSPITAL_COMMUNITY)
Admission: RE | Admit: 2011-06-09 | Discharge: 2011-06-09 | Disposition: A | Discharge status: Home / Self Care | Payer: Self-pay | Source: Ambulatory Visit | Attending: Cardiovascular Disease | Admitting: Cardiovascular Disease

## 2011-06-12 ENCOUNTER — Encounter (HOSPITAL_COMMUNITY)
Admission: RE | Admit: 2011-06-12 | Discharge: 2011-06-12 | Disposition: A | Discharge status: Home / Self Care | Payer: Self-pay | Source: Ambulatory Visit | Attending: Cardiovascular Disease | Admitting: Cardiovascular Disease

## 2011-06-14 ENCOUNTER — Encounter (HOSPITAL_COMMUNITY)
Admission: RE | Admit: 2011-06-14 | Discharge: 2011-06-14 | Disposition: A | Discharge status: Home / Self Care | Payer: Self-pay | Source: Ambulatory Visit | Attending: Cardiovascular Disease | Admitting: Cardiovascular Disease

## 2011-06-16 ENCOUNTER — Encounter (HOSPITAL_COMMUNITY)
Admission: RE | Admit: 2011-06-16 | Discharge: 2011-06-16 | Disposition: A | Discharge status: Home / Self Care | Payer: Self-pay | Source: Ambulatory Visit | Attending: Cardiovascular Disease | Admitting: Cardiovascular Disease

## 2011-06-19 ENCOUNTER — Encounter (HOSPITAL_COMMUNITY): Payer: Self-pay

## 2011-06-21 ENCOUNTER — Encounter (HOSPITAL_COMMUNITY)
Admission: RE | Admit: 2011-06-21 | Discharge: 2011-06-21 | Disposition: A | Discharge status: Home / Self Care | Payer: Self-pay | Source: Ambulatory Visit | Attending: Cardiovascular Disease | Admitting: Cardiovascular Disease

## 2011-06-23 ENCOUNTER — Encounter (HOSPITAL_COMMUNITY)
Admission: RE | Admit: 2011-06-23 | Discharge: 2011-06-23 | Disposition: A | Discharge status: Home / Self Care | Payer: Self-pay | Source: Ambulatory Visit | Attending: Cardiovascular Disease | Admitting: Cardiovascular Disease

## 2011-06-26 ENCOUNTER — Encounter (HOSPITAL_COMMUNITY)
Admission: RE | Admit: 2011-06-26 | Discharge: 2011-06-26 | Disposition: A | Discharge status: Home / Self Care | Payer: Self-pay | Source: Ambulatory Visit | Attending: Cardiovascular Disease | Admitting: Cardiovascular Disease

## 2011-06-26 DIAGNOSIS — Z87891 Personal history of nicotine dependence: Secondary | ICD-10-CM | POA: Insufficient documentation

## 2011-06-26 DIAGNOSIS — I359 Nonrheumatic aortic valve disorder, unspecified: Secondary | ICD-10-CM | POA: Insufficient documentation

## 2011-06-26 DIAGNOSIS — N289 Disorder of kidney and ureter, unspecified: Secondary | ICD-10-CM | POA: Insufficient documentation

## 2011-06-26 DIAGNOSIS — I739 Peripheral vascular disease, unspecified: Secondary | ICD-10-CM | POA: Insufficient documentation

## 2011-06-26 DIAGNOSIS — E119 Type 2 diabetes mellitus without complications: Secondary | ICD-10-CM | POA: Insufficient documentation

## 2011-06-26 DIAGNOSIS — H548 Legal blindness, as defined in USA: Secondary | ICD-10-CM | POA: Insufficient documentation

## 2011-06-26 DIAGNOSIS — G4733 Obstructive sleep apnea (adult) (pediatric): Secondary | ICD-10-CM | POA: Insufficient documentation

## 2011-06-26 DIAGNOSIS — Z5189 Encounter for other specified aftercare: Secondary | ICD-10-CM | POA: Insufficient documentation

## 2011-06-26 DIAGNOSIS — K219 Gastro-esophageal reflux disease without esophagitis: Secondary | ICD-10-CM | POA: Insufficient documentation

## 2011-06-26 DIAGNOSIS — I10 Essential (primary) hypertension: Secondary | ICD-10-CM | POA: Insufficient documentation

## 2011-06-26 DIAGNOSIS — I4891 Unspecified atrial fibrillation: Secondary | ICD-10-CM | POA: Insufficient documentation

## 2011-06-26 DIAGNOSIS — Z954 Presence of other heart-valve replacement: Secondary | ICD-10-CM | POA: Insufficient documentation

## 2011-06-26 DIAGNOSIS — E785 Hyperlipidemia, unspecified: Secondary | ICD-10-CM | POA: Insufficient documentation

## 2011-06-26 DIAGNOSIS — I2510 Atherosclerotic heart disease of native coronary artery without angina pectoris: Secondary | ICD-10-CM | POA: Insufficient documentation

## 2011-06-26 DIAGNOSIS — Z951 Presence of aortocoronary bypass graft: Secondary | ICD-10-CM | POA: Insufficient documentation

## 2011-06-26 DIAGNOSIS — Z7982 Long term (current) use of aspirin: Secondary | ICD-10-CM | POA: Insufficient documentation

## 2011-06-26 DIAGNOSIS — R5381 Other malaise: Secondary | ICD-10-CM | POA: Insufficient documentation

## 2011-06-28 ENCOUNTER — Encounter (HOSPITAL_COMMUNITY)
Admission: RE | Admit: 2011-06-28 | Discharge: 2011-06-28 | Disposition: A | Discharge status: Home / Self Care | Payer: Self-pay | Source: Ambulatory Visit | Attending: Cardiovascular Disease | Admitting: Cardiovascular Disease

## 2011-06-30 ENCOUNTER — Encounter (HOSPITAL_COMMUNITY): Payer: Self-pay

## 2011-07-03 ENCOUNTER — Encounter (HOSPITAL_COMMUNITY)
Admission: RE | Admit: 2011-07-03 | Discharge: 2011-07-03 | Disposition: A | Discharge status: Home / Self Care | Payer: Self-pay | Source: Ambulatory Visit | Attending: Cardiovascular Disease | Admitting: Cardiovascular Disease

## 2011-07-05 ENCOUNTER — Encounter (HOSPITAL_COMMUNITY)
Admission: RE | Admit: 2011-07-05 | Discharge: 2011-07-05 | Disposition: A | Discharge status: Home / Self Care | Payer: Self-pay | Source: Ambulatory Visit | Attending: Cardiovascular Disease | Admitting: Cardiovascular Disease

## 2011-07-07 ENCOUNTER — Encounter (HOSPITAL_COMMUNITY)
Admission: RE | Admit: 2011-07-07 | Discharge: 2011-07-07 | Disposition: A | Discharge status: Home / Self Care | Payer: Self-pay | Source: Ambulatory Visit | Attending: Cardiovascular Disease | Admitting: Cardiovascular Disease

## 2011-07-10 ENCOUNTER — Encounter (HOSPITAL_COMMUNITY)
Admission: RE | Admit: 2011-07-10 | Discharge: 2011-07-10 | Disposition: A | Discharge status: Home / Self Care | Payer: Self-pay | Source: Ambulatory Visit | Attending: Cardiovascular Disease | Admitting: Cardiovascular Disease

## 2011-07-12 ENCOUNTER — Encounter (HOSPITAL_COMMUNITY)
Admission: RE | Admit: 2011-07-12 | Discharge: 2011-07-12 | Disposition: A | Discharge status: Home / Self Care | Payer: Self-pay | Source: Ambulatory Visit | Attending: Cardiovascular Disease | Admitting: Cardiovascular Disease

## 2011-07-14 ENCOUNTER — Encounter (HOSPITAL_COMMUNITY)
Admission: RE | Admit: 2011-07-14 | Discharge: 2011-07-14 | Disposition: A | Discharge status: Home / Self Care | Payer: Self-pay | Source: Ambulatory Visit | Attending: Cardiovascular Disease | Admitting: Cardiovascular Disease

## 2011-07-17 ENCOUNTER — Encounter (HOSPITAL_COMMUNITY)
Admission: RE | Admit: 2011-07-17 | Discharge: 2011-07-17 | Disposition: A | Discharge status: Home / Self Care | Payer: Self-pay | Source: Ambulatory Visit | Attending: Cardiovascular Disease | Admitting: Cardiovascular Disease

## 2011-07-19 ENCOUNTER — Encounter (HOSPITAL_COMMUNITY)
Admission: RE | Admit: 2011-07-19 | Discharge: 2011-07-19 | Disposition: A | Discharge status: Home / Self Care | Payer: Self-pay | Source: Ambulatory Visit | Attending: Cardiovascular Disease | Admitting: Cardiovascular Disease

## 2011-07-21 ENCOUNTER — Encounter (HOSPITAL_COMMUNITY): Payer: Self-pay

## 2011-07-24 ENCOUNTER — Encounter (HOSPITAL_COMMUNITY)
Admission: RE | Admit: 2011-07-24 | Discharge: 2011-07-24 | Disposition: A | Discharge status: Home / Self Care | Payer: Self-pay | Source: Ambulatory Visit | Attending: Cardiovascular Disease | Admitting: Cardiovascular Disease

## 2011-07-24 DIAGNOSIS — E119 Type 2 diabetes mellitus without complications: Secondary | ICD-10-CM | POA: Insufficient documentation

## 2011-07-24 DIAGNOSIS — I359 Nonrheumatic aortic valve disorder, unspecified: Secondary | ICD-10-CM | POA: Insufficient documentation

## 2011-07-24 DIAGNOSIS — Z951 Presence of aortocoronary bypass graft: Secondary | ICD-10-CM | POA: Insufficient documentation

## 2011-07-24 DIAGNOSIS — Z5189 Encounter for other specified aftercare: Secondary | ICD-10-CM | POA: Insufficient documentation

## 2011-07-24 DIAGNOSIS — G4733 Obstructive sleep apnea (adult) (pediatric): Secondary | ICD-10-CM | POA: Insufficient documentation

## 2011-07-24 DIAGNOSIS — K219 Gastro-esophageal reflux disease without esophagitis: Secondary | ICD-10-CM | POA: Insufficient documentation

## 2011-07-24 DIAGNOSIS — Z7982 Long term (current) use of aspirin: Secondary | ICD-10-CM | POA: Insufficient documentation

## 2011-07-24 DIAGNOSIS — I4891 Unspecified atrial fibrillation: Secondary | ICD-10-CM | POA: Insufficient documentation

## 2011-07-24 DIAGNOSIS — N289 Disorder of kidney and ureter, unspecified: Secondary | ICD-10-CM | POA: Insufficient documentation

## 2011-07-24 DIAGNOSIS — H548 Legal blindness, as defined in USA: Secondary | ICD-10-CM | POA: Insufficient documentation

## 2011-07-24 DIAGNOSIS — I2510 Atherosclerotic heart disease of native coronary artery without angina pectoris: Secondary | ICD-10-CM | POA: Insufficient documentation

## 2011-07-24 DIAGNOSIS — Z87891 Personal history of nicotine dependence: Secondary | ICD-10-CM | POA: Insufficient documentation

## 2011-07-24 DIAGNOSIS — I10 Essential (primary) hypertension: Secondary | ICD-10-CM | POA: Insufficient documentation

## 2011-07-24 DIAGNOSIS — R5381 Other malaise: Secondary | ICD-10-CM | POA: Insufficient documentation

## 2011-07-24 DIAGNOSIS — E785 Hyperlipidemia, unspecified: Secondary | ICD-10-CM | POA: Insufficient documentation

## 2011-07-24 DIAGNOSIS — Z954 Presence of other heart-valve replacement: Secondary | ICD-10-CM | POA: Insufficient documentation

## 2011-07-24 DIAGNOSIS — I739 Peripheral vascular disease, unspecified: Secondary | ICD-10-CM | POA: Insufficient documentation

## 2011-07-26 ENCOUNTER — Encounter (HOSPITAL_COMMUNITY)
Admission: RE | Admit: 2011-07-26 | Discharge: 2011-07-26 | Disposition: A | Discharge status: Home / Self Care | Payer: Self-pay | Source: Ambulatory Visit | Attending: Cardiovascular Disease | Admitting: Cardiovascular Disease

## 2011-07-28 ENCOUNTER — Encounter (HOSPITAL_COMMUNITY)
Admission: RE | Admit: 2011-07-28 | Discharge: 2011-07-28 | Disposition: A | Discharge status: Home / Self Care | Payer: Self-pay | Source: Ambulatory Visit | Attending: Cardiovascular Disease | Admitting: Cardiovascular Disease

## 2011-07-31 ENCOUNTER — Encounter (HOSPITAL_COMMUNITY)
Admission: RE | Admit: 2011-07-31 | Discharge: 2011-07-31 | Disposition: A | Discharge status: Home / Self Care | Payer: Self-pay | Source: Ambulatory Visit | Attending: Cardiovascular Disease | Admitting: Cardiovascular Disease

## 2011-08-02 ENCOUNTER — Encounter (HOSPITAL_COMMUNITY): Payer: Self-pay

## 2011-08-04 ENCOUNTER — Ambulatory Visit (INDEPENDENT_AMBULATORY_CARE_PROVIDER_SITE_OTHER): Payer: Medicare Other | Admitting: Internal Medicine

## 2011-08-04 ENCOUNTER — Encounter (HOSPITAL_COMMUNITY)
Admission: RE | Admit: 2011-08-04 | Discharge: 2011-08-04 | Disposition: A | Discharge status: Home / Self Care | Payer: Self-pay | Source: Ambulatory Visit | Attending: Cardiovascular Disease | Admitting: Cardiovascular Disease

## 2011-08-04 ENCOUNTER — Encounter: Payer: Self-pay | Admitting: Internal Medicine

## 2011-08-04 ENCOUNTER — Ambulatory Visit: Payer: Medicare Other | Admitting: Oncology

## 2011-08-04 ENCOUNTER — Other Ambulatory Visit (HOSPITAL_BASED_OUTPATIENT_CLINIC_OR_DEPARTMENT_OTHER): Payer: Medicare Other | Admitting: Lab

## 2011-08-04 VITALS — BP 120/62 | HR 59 | Temp 97.9°F | Ht 70.0 in | Wt 231.1 lb

## 2011-08-04 DIAGNOSIS — A0472 Enterocolitis due to Clostridium difficile, not specified as recurrent: Secondary | ICD-10-CM

## 2011-08-04 DIAGNOSIS — R5383 Other fatigue: Secondary | ICD-10-CM

## 2011-08-04 DIAGNOSIS — R197 Diarrhea, unspecified: Secondary | ICD-10-CM

## 2011-08-04 DIAGNOSIS — F32A Depression, unspecified: Secondary | ICD-10-CM | POA: Insufficient documentation

## 2011-08-04 DIAGNOSIS — D696 Thrombocytopenia, unspecified: Secondary | ICD-10-CM

## 2011-08-04 DIAGNOSIS — R5381 Other malaise: Secondary | ICD-10-CM

## 2011-08-04 DIAGNOSIS — D72821 Monocytosis (symptomatic): Secondary | ICD-10-CM

## 2011-08-04 DIAGNOSIS — F329 Major depressive disorder, single episode, unspecified: Secondary | ICD-10-CM

## 2011-08-04 LAB — CBC WITH DIFFERENTIAL/PLATELET
BASO%: 0.4 % (ref 0.0–2.0)
Eosinophils Absolute: 0.1 10*3/uL (ref 0.0–0.5)
HCT: 44.2 % (ref 38.4–49.9)
LYMPH%: 17.1 % (ref 14.0–49.0)
MCHC: 34.2 g/dL (ref 32.0–36.0)
MONO#: 2.1 10*3/uL — ABNORMAL HIGH (ref 0.1–0.9)
NEUT#: 2.7 10*3/uL (ref 1.5–6.5)
NEUT%: 45.9 % (ref 39.0–75.0)
Platelets: 94 10*3/uL — ABNORMAL LOW (ref 140–400)
WBC: 5.9 10*3/uL (ref 4.0–10.3)
lymph#: 1 10*3/uL (ref 0.9–3.3)

## 2011-08-04 LAB — LACTATE DEHYDROGENASE: LDH: 168 U/L (ref 94–250)

## 2011-08-04 LAB — CHCC SMEAR

## 2011-08-04 MED ORDER — DULOXETINE HCL 20 MG PO CPEP: 20.0000 mg | ORAL_CAPSULE | Freq: Every day | ORAL | Status: DC

## 2011-08-04 NOTE — Assessment & Plan Note (Signed)
Chronic symptoms - on lexapro without change since ?2000 Other med illness "stable" per other specialists Given motivational fatigue and hypersomnia, suspect relation to untx depression Stop lexapro and start cymbalta - erx done Continue counseling and follow up 6-12 weeks on same, sooner if worse

## 2011-08-04 NOTE — Progress Notes (Signed)
Subjective:    Patient ID: [name], male    DOB: Jun 20, 1927, 78 y.o.   MRN: [id number]  Diarrhea  This is a new problem. The current episode started yesterday. The problem has been rapidly improving. The patient states that diarrhea does not awaken him from sleep. Pertinent negatives include no abdominal pain, bloating, coughing, fever, headaches, increased  flatus, myalgias, vomiting or weight loss. Risk factors include suspect food intake. He has tried nothing for the symptoms. The treatment provided moderate relief. hx c diff 12/2009   also reviewed chronic med issues:  DM2 - off all meds since 01/2010 - prev on starlix for years - a1c 5.6 preop 01/2010 - does not check cbg at home - no signs or symptoms hyper/hypoglycemia - weight gain reviewed  OSA - follows with pulm/sleep specialist for same - on CPAP, frequently falling asleep in daytime  prostate cancer hx- s/p resection 1993 - follows with uro q6-33mo- ([facility]) - no voiding issues or change in flow  depression - exac by illness and hosp early 2012, but not improved with ongoing PT, rehab and counseling - reports compliance with ongoing medical treatment and no changes in medication dose or frequency. lexapro >43yrs. denies adverse side effects related to current therapy. Increased fatigue and hypersomnia but denies sadness/tearfulness  Past Medical History  Diagnosis Date  . Adenomatous colon polyp 03/1993  . Prostate cancer   . Depression   . OSA (obstructive sleep apnea)   . Hearing loss   . Atrial fibrillation     not anticoag candidate due to decond and falls  . Legally blind 1987  . AORTIC STENOSIS     s/p AVR 01/2010  . CLOSTRIDIUM DIFFICILE COLITIS 12/30/2009  . CORONARY ARTERY DISEASE     s/p CABG 01/28/10  . HIP REPLACEMENT, RIGHT, HX OF 2006  . HYPERTROPHY PROSTATE W/UR OBST & OTH LUTS   . MRSA infection 04/2010  . Gout   . THROMBOCYTOPENIA   . Arthritis   . DEGENERATIVE DISC DISEASE   . Diabetes  mellitus, type 2   . GERD   . Hyperlipidemia   . Hypertension     Review of Systems  Constitutional: Positive for fatigue. Negative for fever, weight loss and unexpected weight change.  Respiratory: Negative for cough and shortness of breath.   Cardiovascular: Negative for chest pain.  Gastrointestinal: Positive for diarrhea. Negative for vomiting, abdominal pain, bloating and flatus.  Musculoskeletal: Negative for myalgias.  Neurological: Negative for headaches.      Objective:   Physical Exam  BP 120/62  Pulse 59  Temp 97.9 F (36.6 C) (Oral)  Ht 5\' 10"  (1.778 m)  Wt 231 lb 1.9 oz (104.835 kg)  BMI 33.16 kg/m2  SpO2 96% Wt Readings from Last 3 Encounters:  08/04/11 231 lb 1.9 oz (104.835 kg)  03/28/11 229 lb 4.8 oz (104.01 kg)  03/22/11 231 lb (104.781 kg)   Constitutional:  he is overweight, appears well-developed and well-nourished. No distress. Wife and dtr at side Cardiovascular: Normal rate, regular rhythm and normal heart sounds.  No murmur heard. no edema BLE Pulmonary/Chest: Effort normal and breath sounds normal. No respiratory distress. no wheezes.  Abd: SNTND, +BS, no mass Neurological: he is alert and oriented to person, place, and time. No cranial nerve deficit. Coordination normal.  Psychiatric: he has a reserved but normal mood and affect. behavior is normal. Judgment and thought content normal.   Lab Results  Component Value Date   WBC 5.9 08/04/2011  HGB 15.1 08/04/2011   HCT 44.2 08/04/2011   PLT 94* 08/04/2011   CHOL 137 03/22/2011   TRIG 209.0* 03/22/2011   HDL 42.90 03/22/2011   LDLDIRECT 73.0 03/22/2011   ALT 21 01/06/2011   AST 20 01/06/2011   NA 140 01/06/2011   K 4.4 01/06/2011   CL 101 01/06/2011   CREATININE 1.09 01/06/2011   BUN 14 01/06/2011   CO2 30 01/06/2011   TSH 1.08 03/22/2011   PSA 0.21 09/13/2010   INR 1.04 12/05/2010   HGBA1C 5.9 03/22/2011        Assessment & Plan:   see problem list. Medications and labs reviewed  today.  Fatigue - ?overlap with depression vs other problem - discussed same 02/2011 - Reviewed recent labs Will change SSRI therapy - see depression below  "Diarrhea" - episode 48h ago, much improved with avoidance of melon - today 2 formed stools, but wife remains concerned due to hx C diff 12/2009 - will check sample if recurrent loose stool.

## 2011-08-04 NOTE — Patient Instructions (Signed)
It was good to see you today. We have reviewed your prior records including labs and tests today Stop Lexapro and start Cymbalta for depression symptoms - Your prescription(s) have been submitted to your pharmacy. Please take as directed and contact our office if you believe you are having problem(s) with the medication(s). Test(s) ordered today. Return stool sample for testing if recurrent diarrhea. Your results will be called to you after review (48-72hours after test completion). If any changes need to be made, you will be notified at that time. Please schedule followup in 3-4 months for depression recheck, call sooner if problems.

## 2011-08-07 ENCOUNTER — Ambulatory Visit (HOSPITAL_BASED_OUTPATIENT_CLINIC_OR_DEPARTMENT_OTHER): Payer: Medicare Other | Admitting: Oncology

## 2011-08-07 ENCOUNTER — Encounter (HOSPITAL_COMMUNITY)
Admission: RE | Admit: 2011-08-07 | Discharge: 2011-08-07 | Disposition: A | Discharge status: Home / Self Care | Payer: Self-pay | Source: Ambulatory Visit | Attending: Cardiovascular Disease | Admitting: Cardiovascular Disease

## 2011-08-07 VITALS — BP 152/62 | HR 57 | Temp 97.7°F | Ht 70.0 in | Wt 234.8 lb

## 2011-08-07 DIAGNOSIS — D696 Thrombocytopenia, unspecified: Secondary | ICD-10-CM

## 2011-08-07 DIAGNOSIS — D72821 Monocytosis (symptomatic): Secondary | ICD-10-CM

## 2011-08-07 NOTE — Progress Notes (Signed)
[facility] OFFICE PROGRESS NOTE  Cc:   Rene Paci, MD.   DIAGNOSIS:  Monocytosis, thrombocytopenia, NOS.   Extensive work up including bone marrow biopsy was negative.   CURRENT THERAPY:  Watchful observation.   INTERVAL HISTORY: [name] 78 y.o. male returns for regular follow up with his wife.  He has been eating lots of fruits and had diarrhea last week which has since resolved.  His PCP switched his Lexapro to Duloxetine; and since then, his fatigue has significantly  decreased. He sleeps less now; and is more active with activities.  He denies night sweat, weight loss, visible bleeding, skin rash, bone pain, node swelling.     MEDICAL HISTORY: Past Medical History  Diagnosis Date  . Adenomatous colon polyp 03/1993  . Prostate cancer   . Depression   . OSA (obstructive sleep apnea)   . Hearing loss   . Atrial fibrillation     not anticoag candidate due to decond and falls  . Legally blind 1987  . AORTIC STENOSIS     s/p AVR 01/2010  . CLOSTRIDIUM DIFFICILE COLITIS 12/30/2009  . CORONARY ARTERY DISEASE     s/p CABG 01/28/10  . HIP REPLACEMENT, RIGHT, HX OF 2006  . HYPERTROPHY PROSTATE W/UR OBST & OTH LUTS   . MRSA infection 04/2010  . Gout   . THROMBOCYTOPENIA   . Arthritis   . DEGENERATIVE DISC DISEASE   . Diabetes mellitus, type 2   . GERD   . Hyperlipidemia   . Hypertension     SURGICAL HISTORY:  Past Surgical History  Procedure Date  . Coronary artery bypass graft 01/28/2010    x3  . Aortic valve replacement 01/28/2010  . Prostatectomy 1993  . Hernia repair 03/15/2009    left  . Anal sphincterotomy 1996  . Total hip arthroplasty 2006    left  . Cataract extraction, bilateral     4/11 & 5/11  . Cholecystectomy 2011    MEDICATIONS: Current Outpatient Prescriptions  Medication Sig Dispense Refill  . acetaminophen (TYLENOL) 500 MG tablet Take 650 mg by mouth as needed.       Marland Kitchen allopurinol (ZYLOPRIM) 300 MG tablet Take 1  tablet (300 mg total) by mouth at bedtime.  90 tablet  2  . Ascorbic Acid (VITAMIN C PO) Take 1 tablet by mouth every morning.        Marland Kitchen aspirin 325 MG tablet Take 325 mg by mouth every morning.       Marland Kitchen CALCIUM PO Take 1 tablet by mouth every morning.        . diltiazem (CARDIZEM CD) 180 MG 24 hr capsule Take 360 mg by mouth every morning.       . docusate sodium (COLACE) 100 MG capsule Take 100 mg by mouth daily.        . DULoxetine (CYMBALTA) 20 MG capsule Take 1 capsule (20 mg total) by mouth daily.  90 capsule  3  . furosemide (LASIX) 40 MG tablet Take 0.5 tablets (20 mg total) by mouth daily.  45 tablet  2  . metoprolol tartrate (LOPRESSOR) 25 MG tablet Take 1 tablet (25 mg total) by mouth 2 (two) times daily.  180 tablet  2  . Multiple Vitamins-Minerals (ICAPS MV PO) Take 1 tablet by mouth every morning.        Marland Kitchen omeprazole (PRILOSEC) 20 MG capsule Take 1 capsule (20 mg total) by mouth daily after breakfast.  90 capsule  2  .  polysaccharide iron (NIFEREX) 150 MG CAPS capsule Take 150 mg by mouth daily.        . potassium chloride (K-DUR) 10 MEQ tablet Take 1 tablet (10 mEq total) by mouth daily.  90 tablet  2  . Pyridoxine HCl (VITAMIN B-6 PO) Take 1 tablet by mouth every morning.        . ranitidine (ZANTAC) 150 MG tablet Take 0.5 tablets (75 mg total) by mouth every morning.  45 tablet  2  . rOPINIRole (REQUIP) 0.25 MG tablet Take 3 tablets (0.75 mg total) by mouth at bedtime.  270 tablet  2  . Sertaconazole Nitrate 2 % CREA Apply topically as needed. Athletes foot       . Thiamine HCl (VITAMIN B-1 PO) Take 1 tablet by mouth every morning.        . cetirizine (ZYRTEC ALLERGY) 10 MG tablet Take 10 mg by mouth daily.      . Cholecalciferol (VITAMIN D-3 PO) Take 2,000 Units by mouth daily.          ALLERGIES:  is allergic to doxycycline; septra; and morphine.  REVIEW OF SYSTEMS:  The rest of the 14-point review of system was negative.   Filed Vitals:   08/07/11 1507  BP: 152/62    Pulse: 57  Temp: 97.7 F (36.5 C)   Wt Readings from Last 3 Encounters:  08/07/11 234 lb 12.8 oz (106.505 kg)  08/04/11 231 lb 1.9 oz (104.835 kg)  03/28/11 229 lb 4.8 oz (104.01 kg)   ECOG Performance status: 1.   PHYSICAL EXAMINATION:   General:  Mildly obese male in no acute distress.  Eyes:  no scleral icterus.  ENT:  There were no oropharyngeal lesions.  Neck was without thyromegaly.  Lymphatics:  Negative cervical, supraclavicular or axillary adenopathy.  Respiratory: lungs were clear bilaterally without wheezing or crackles.  Cardiovascular:  Regular rate and rhythm, S1/S2, without murmur, rub or gallop.  There was no pedal edema.  GI:  abdomen was soft, flat, nontender, nondistended, without organomegaly.  Muscoloskeletal:  no spinal tenderness of palpation of vertebral spine.  Skin exam was without echymosis, petichae.  Neuro exam was nonfocal.  Patient needed minimal assistance to get on and off exam table.  Gait was normal.  Patient was alerted and oriented.  Attention was good.   Language was appropriate.  Mood was normal without depression.  Speech was not pressured.  Thought content was not tangential.    LABORATORY/RADIOLOGY DATA:  Lab Results  Component Value Date   WBC 5.9 08/04/2011   HGB 15.1 08/04/2011   HCT 44.2 08/04/2011   PLT 94* 08/04/2011   GLUCOSE 99 01/06/2011   CHOL 137 03/22/2011   TRIG 209.0* 03/22/2011   HDL 42.90 03/22/2011   LDLDIRECT 73.0 03/22/2011   LDLCALC 66 09/13/2010   ALT 21 01/06/2011   AST 20 01/06/2011   NA 140 01/06/2011   K 4.4 01/06/2011   CL 101 01/06/2011   CREATININE 1.09 01/06/2011   BUN 14 01/06/2011   CO2 30 01/06/2011   TSH 1.08 03/22/2011   PSA 0.21 09/13/2010   INR 1.04 12/05/2010   HGBA1C 5.9 03/22/2011    I personally reviewed the patient's peripheral blood smear today.  There was isocytosis.  There was no peripheral blast.  There was no schistocytosis, spherocytosis, target cell, rouleaux formation, tear drop cell.  There  was no giant platelets or platelet clumps.  There was increased in peripheral monocytosis but no obvious immature blast.  ASSESSMENT AND PLAN:   1. Chronic thrombocytopenia:  Unclear etiology.  Most likely mild ITP.  Extensive work up was negative for consumptive process, portal hypertension, primary bone marrow failure.  His platelet is stable.  There is low risk of spontaneous bleeding with Plt >30K.  I again recommended watchful observation.  2. Monocytosis:   Work up with bone marrow biopsy, BCR/ABL were negative.  This is chronic and stable; most likely reactive.  I again recommended watchful observation.  3. Coronary artery disease:  S/p CABG in 2011.   He is on aspirin, diltiazem, metoprolol.  4. History of gout:   He is on allopurinol. 5. Borderline Diabetes mellitus, type II:  Diet control per PCP.  6. Hypertension:  Well controlled on metoprolol, Lasix, and Cardizem per PCP. 7. Hyperlipidemia:  He is on diet control.  8. Gastroesophageal reflux disease:  He is on omeprazole.  9. Follow up:  CBC in 4 and 8 months at the [facility].  Follow up in 1 year.  In the future, if he develops thrombocytopenia <50K or pancytopenia or significantly worsened monocytosis, further work up maybe considered.

## 2011-08-07 NOTE — Patient Instructions (Signed)
1.  Issues:  Elevated monocytes (monocytosis) and low platelet count (thrombocytopenia):  Chronic; stable.  Bone marrow biopsy in the past was negative.  There was no leukemia or lymphoma.  I again recommend observation.   2.  Follow up with Lab at [facility] in about 4 and 8 months, and return visit in about 1 year.

## 2011-08-09 ENCOUNTER — Encounter (HOSPITAL_COMMUNITY)
Admission: RE | Admit: 2011-08-09 | Discharge: 2011-08-09 | Disposition: A | Discharge status: Home / Self Care | Payer: Self-pay | Source: Ambulatory Visit | Attending: Cardiovascular Disease | Admitting: Cardiovascular Disease

## 2011-08-11 ENCOUNTER — Encounter (HOSPITAL_COMMUNITY): Payer: Self-pay

## 2011-08-14 ENCOUNTER — Encounter (HOSPITAL_COMMUNITY)
Admission: RE | Admit: 2011-08-14 | Discharge: 2011-08-14 | Disposition: A | Discharge status: Home / Self Care | Payer: Self-pay | Source: Ambulatory Visit | Attending: Cardiovascular Disease | Admitting: Cardiovascular Disease

## 2011-08-16 ENCOUNTER — Encounter (HOSPITAL_COMMUNITY)
Admission: RE | Admit: 2011-08-16 | Discharge: 2011-08-16 | Disposition: A | Discharge status: Home / Self Care | Payer: Self-pay | Source: Ambulatory Visit | Attending: Cardiovascular Disease | Admitting: Cardiovascular Disease

## 2011-08-18 ENCOUNTER — Encounter (HOSPITAL_COMMUNITY)
Admission: RE | Admit: 2011-08-18 | Discharge: 2011-08-18 | Disposition: A | Discharge status: Home / Self Care | Payer: Self-pay | Source: Ambulatory Visit | Attending: Cardiovascular Disease | Admitting: Cardiovascular Disease

## 2011-08-21 ENCOUNTER — Encounter (HOSPITAL_COMMUNITY)
Admission: RE | Admit: 2011-08-21 | Discharge: 2011-08-21 | Disposition: A | Discharge status: Home / Self Care | Payer: Self-pay | Source: Ambulatory Visit | Attending: Cardiovascular Disease | Admitting: Cardiovascular Disease

## 2011-08-23 ENCOUNTER — Encounter (HOSPITAL_COMMUNITY)
Admission: RE | Admit: 2011-08-23 | Discharge: 2011-08-23 | Disposition: A | Discharge status: Home / Self Care | Payer: Self-pay | Source: Ambulatory Visit | Attending: Cardiovascular Disease | Admitting: Cardiovascular Disease

## 2011-08-25 ENCOUNTER — Encounter (HOSPITAL_COMMUNITY)
Admission: RE | Admit: 2011-08-25 | Discharge: 2011-08-25 | Disposition: A | Discharge status: Home / Self Care | Payer: Self-pay | Source: Ambulatory Visit | Attending: Cardiovascular Disease | Admitting: Cardiovascular Disease

## 2011-08-25 DIAGNOSIS — I2510 Atherosclerotic heart disease of native coronary artery without angina pectoris: Secondary | ICD-10-CM | POA: Insufficient documentation

## 2011-08-25 DIAGNOSIS — Z5189 Encounter for other specified aftercare: Secondary | ICD-10-CM | POA: Insufficient documentation

## 2011-08-25 DIAGNOSIS — H548 Legal blindness, as defined in USA: Secondary | ICD-10-CM | POA: Insufficient documentation

## 2011-08-25 DIAGNOSIS — K219 Gastro-esophageal reflux disease without esophagitis: Secondary | ICD-10-CM | POA: Insufficient documentation

## 2011-08-25 DIAGNOSIS — Z87891 Personal history of nicotine dependence: Secondary | ICD-10-CM | POA: Insufficient documentation

## 2011-08-25 DIAGNOSIS — R5381 Other malaise: Secondary | ICD-10-CM | POA: Insufficient documentation

## 2011-08-25 DIAGNOSIS — Z7982 Long term (current) use of aspirin: Secondary | ICD-10-CM | POA: Insufficient documentation

## 2011-08-25 DIAGNOSIS — Z954 Presence of other heart-valve replacement: Secondary | ICD-10-CM | POA: Insufficient documentation

## 2011-08-25 DIAGNOSIS — I739 Peripheral vascular disease, unspecified: Secondary | ICD-10-CM | POA: Insufficient documentation

## 2011-08-25 DIAGNOSIS — E119 Type 2 diabetes mellitus without complications: Secondary | ICD-10-CM | POA: Insufficient documentation

## 2011-08-25 DIAGNOSIS — I10 Essential (primary) hypertension: Secondary | ICD-10-CM | POA: Insufficient documentation

## 2011-08-25 DIAGNOSIS — I359 Nonrheumatic aortic valve disorder, unspecified: Secondary | ICD-10-CM | POA: Insufficient documentation

## 2011-08-25 DIAGNOSIS — E785 Hyperlipidemia, unspecified: Secondary | ICD-10-CM | POA: Insufficient documentation

## 2011-08-25 DIAGNOSIS — I4891 Unspecified atrial fibrillation: Secondary | ICD-10-CM | POA: Insufficient documentation

## 2011-08-25 DIAGNOSIS — Z951 Presence of aortocoronary bypass graft: Secondary | ICD-10-CM | POA: Insufficient documentation

## 2011-08-25 DIAGNOSIS — N289 Disorder of kidney and ureter, unspecified: Secondary | ICD-10-CM | POA: Insufficient documentation

## 2011-08-25 DIAGNOSIS — G4733 Obstructive sleep apnea (adult) (pediatric): Secondary | ICD-10-CM | POA: Insufficient documentation

## 2011-08-28 ENCOUNTER — Encounter (HOSPITAL_COMMUNITY)
Admission: RE | Admit: 2011-08-28 | Discharge: 2011-08-28 | Disposition: A | Discharge status: Home / Self Care | Payer: Self-pay | Source: Ambulatory Visit | Attending: Cardiovascular Disease | Admitting: Cardiovascular Disease

## 2011-08-30 ENCOUNTER — Encounter (HOSPITAL_COMMUNITY)
Admission: RE | Admit: 2011-08-30 | Discharge: 2011-08-30 | Disposition: A | Discharge status: Home / Self Care | Payer: Self-pay | Source: Ambulatory Visit | Attending: Cardiovascular Disease | Admitting: Cardiovascular Disease

## 2011-09-01 ENCOUNTER — Encounter (HOSPITAL_COMMUNITY)
Admission: RE | Admit: 2011-09-01 | Discharge: 2011-09-01 | Disposition: A | Discharge status: Home / Self Care | Payer: Self-pay | Source: Ambulatory Visit | Attending: Cardiovascular Disease | Admitting: Cardiovascular Disease

## 2011-09-04 ENCOUNTER — Encounter (HOSPITAL_COMMUNITY)
Admission: RE | Admit: 2011-09-04 | Discharge: 2011-09-04 | Disposition: A | Discharge status: Home / Self Care | Payer: Self-pay | Source: Ambulatory Visit | Attending: Cardiovascular Disease | Admitting: Cardiovascular Disease

## 2011-09-04 ENCOUNTER — Other Ambulatory Visit: Payer: Medicare Other | Admitting: Lab

## 2011-09-06 ENCOUNTER — Encounter (HOSPITAL_COMMUNITY): Payer: Self-pay

## 2011-09-08 ENCOUNTER — Encounter (HOSPITAL_COMMUNITY)
Admission: RE | Admit: 2011-09-08 | Discharge: 2011-09-08 | Disposition: A | Discharge status: Home / Self Care | Payer: Self-pay | Source: Ambulatory Visit | Attending: Cardiovascular Disease | Admitting: Cardiovascular Disease

## 2011-09-11 ENCOUNTER — Encounter (HOSPITAL_COMMUNITY)
Admission: RE | Admit: 2011-09-11 | Discharge: 2011-09-11 | Disposition: A | Discharge status: Home / Self Care | Payer: Self-pay | Source: Ambulatory Visit | Attending: Cardiovascular Disease | Admitting: Cardiovascular Disease

## 2011-09-13 ENCOUNTER — Encounter (HOSPITAL_COMMUNITY)
Admission: RE | Admit: 2011-09-13 | Discharge: 2011-09-13 | Disposition: A | Discharge status: Home / Self Care | Payer: Self-pay | Source: Ambulatory Visit | Attending: Cardiovascular Disease | Admitting: Cardiovascular Disease

## 2011-09-15 ENCOUNTER — Encounter (HOSPITAL_COMMUNITY)
Admission: RE | Admit: 2011-09-15 | Discharge: 2011-09-15 | Disposition: A | Discharge status: Home / Self Care | Payer: Self-pay | Source: Ambulatory Visit | Attending: Cardiovascular Disease | Admitting: Cardiovascular Disease

## 2011-09-18 ENCOUNTER — Ambulatory Visit (INDEPENDENT_AMBULATORY_CARE_PROVIDER_SITE_OTHER): Payer: Medicare Other | Admitting: Pulmonary Disease

## 2011-09-18 ENCOUNTER — Encounter (HOSPITAL_COMMUNITY)
Admission: RE | Admit: 2011-09-18 | Discharge: 2011-09-18 | Disposition: A | Discharge status: Home / Self Care | Payer: Self-pay | Source: Ambulatory Visit | Attending: Cardiovascular Disease | Admitting: Cardiovascular Disease

## 2011-09-18 ENCOUNTER — Encounter: Payer: Self-pay | Admitting: Pulmonary Disease

## 2011-09-18 VITALS — BP 138/68 | HR 62 | Temp 97.7°F | Ht 70.0 in | Wt 229.8 lb

## 2011-09-18 DIAGNOSIS — G4733 Obstructive sleep apnea (adult) (pediatric): Secondary | ICD-10-CM

## 2011-09-18 NOTE — Patient Instructions (Addendum)
Your CPAP is set at 17 cm Take Requip towards late evening - 8pm

## 2011-09-18 NOTE — Progress Notes (Signed)
  Subjective:    Patient ID: [name], male    DOB: Mar 15, 1927, 78 y.o.   MRN: [id number]  HPI 84/M for FU of obstructive sleep apnea & excessive somnolence Oct '11 >> staph sepsis  Nov 11 >> c .diff colitis, low plts  Underwent CABG x3 Pig AVR in jan '12, in cardiac rehab  H/o Monocytosis, thrombocytopenia - ? Idiopathic, neg BM biopsy ([facility])  He had remote sleep study in [place], another Study in 2005  On VPAP auto , mirage full face mask, pr increased to 12 cm in jan'12 after download check  Download 9/17-05/02/10 shows avg pr 15 cm, AHI 14.4 residual with predominant hypopneas, some leak & excellent compliance >> Pr increased to 17 cm  Reviewed ONO on CPAP >> no desaturation , Does not need O2 Download 8/2 -09/07/10 showed avg pressure 11 cm, avg usage 5 h, minimal leak  09/18/2011  In cardiac rehab Lexapro changed to cymbalta - more awake per wife but  grouchy VPAP auto download shows residual AHI 9/h, leak ++, excellent usage on 17 cm (14 on expiration) Takes requip at bedtime ESS 18 , bedtime 10p, minimal latency, Nocturia + since prostate sx, oob at 0930 , feels refreshed , no headaches, daytime naps +  There is no history suggestive of cataplexy, sleep paralysis or parasomnias       Review of Systems neg for any significant sore throat, dysphagia, itching, sneezing, nasal congestion or excess/ purulent secretions, fever, chills, sweats, unintended wt loss, pleuritic or exertional cp, hempoptysis, orthopnea pnd or change in chronic leg swelling. Also denies presyncope, palpitations, heartburn, abdominal pain, nausea, vomiting, diarrhea or change in bowel or urinary habits, dysuria,hematuria, rash, arthralgias, visual complaints, headache, numbness weakness or ataxia.     Objective:   Physical Exam  Gen. Pleasant, obese, in no distress ENT - no lesions, no post nasal drip Neck: No JVD, no thyromegaly, no carotid bruits Lungs: no use of accessory muscles, no  dullness to percussion, decreased without rales or rhonchi  Cardiovascular: Rhythm regular, heart sounds  normal, no murmurs or gallops, no peripheral edema Musculoskeletal: No deformities, no cyanosis or clubbing , no tremors       Assessment & Plan:

## 2011-09-18 NOTE — Assessment & Plan Note (Signed)
Your CPAP is set at 17 cm - excellent compliance Take Requip towards late evening - 8pm  Weight loss encouraged, compliance with goal of at least 4-6 hrs every night is the expectation. Advised against medications with sedative side effects Cautioned against driving when sleepy - understanding that sleepiness will vary on a day to day basis

## 2011-09-19 ENCOUNTER — Encounter: Payer: Self-pay | Admitting: Internal Medicine

## 2011-09-19 ENCOUNTER — Ambulatory Visit (INDEPENDENT_AMBULATORY_CARE_PROVIDER_SITE_OTHER): Payer: Medicare Other | Admitting: Internal Medicine

## 2011-09-19 ENCOUNTER — Other Ambulatory Visit (INDEPENDENT_AMBULATORY_CARE_PROVIDER_SITE_OTHER): Payer: Medicare Other

## 2011-09-19 VITALS — BP 130/60 | HR 63 | Temp 97.7°F | Ht 70.0 in | Wt 230.0 lb

## 2011-09-19 DIAGNOSIS — E785 Hyperlipidemia, unspecified: Secondary | ICD-10-CM

## 2011-09-19 DIAGNOSIS — E119 Type 2 diabetes mellitus without complications: Secondary | ICD-10-CM

## 2011-09-19 DIAGNOSIS — F329 Major depressive disorder, single episode, unspecified: Secondary | ICD-10-CM

## 2011-09-19 DIAGNOSIS — F3289 Other specified depressive episodes: Secondary | ICD-10-CM

## 2011-09-19 LAB — LIPID PANEL
Cholesterol: 252 mg/dL — ABNORMAL HIGH (ref 0–200)
HDL: 52 mg/dL (ref >39.00)
Total CHOL/HDL Ratio: 5
Triglycerides: 167 mg/dL — ABNORMAL HIGH (ref 0.0–149.0)

## 2011-09-19 LAB — BASIC METABOLIC PANEL
Calcium: 9.8 mg/dL (ref 8.4–10.5)
Chloride: 103 mEq/L (ref 96–112)
Creatinine, Ser: 0.9 mg/dL (ref 0.4–1.5)
Sodium: 142 mEq/L (ref 135–145)

## 2011-09-19 LAB — LDL CHOLESTEROL, DIRECT: Direct LDL: 174.3 mg/dL

## 2011-09-19 LAB — HEPATIC FUNCTION PANEL
AST: 18 U/L (ref 0–37)
Albumin: 4.4 g/dL (ref 3.5–5.2)

## 2011-09-19 LAB — TSH: TSH: 1.55 u[IU]/mL (ref 0.35–5.50)

## 2011-09-19 MED ORDER — ROSUVASTATIN CALCIUM 10 MG PO TABS: 10.0000 mg | ORAL_TABLET | Freq: Every day | ORAL | Status: DC

## 2011-09-19 MED ORDER — RANITIDINE HCL 150 MG PO TABS: 75.0000 mg | ORAL_TABLET | ORAL | Status: DC

## 2011-09-19 MED ORDER — FUROSEMIDE 40 MG PO TABS: 20.0000 mg | ORAL_TABLET | Freq: Every day | ORAL | Status: DC

## 2011-09-19 MED ORDER — METOPROLOL TARTRATE 25 MG PO TABS: 25.0000 mg | ORAL_TABLET | Freq: Two times a day (BID) | ORAL | Status: DC

## 2011-09-19 MED ORDER — ROPINIROLE HCL 0.25 MG PO TABS: 0.7500 mg | ORAL_TABLET | Freq: Every day | ORAL | Status: DC

## 2011-09-19 MED ORDER — ALLOPURINOL 300 MG PO TABS: 300.0000 mg | ORAL_TABLET | Freq: Every day | ORAL | Status: DC

## 2011-09-19 MED ORDER — DULOXETINE HCL 20 MG PO CPEP: 20.0000 mg | ORAL_CAPSULE | Freq: Every day | ORAL | Status: DC

## 2011-09-19 MED ORDER — OMEPRAZOLE 20 MG PO CPDR: 20.0000 mg | DELAYED_RELEASE_CAPSULE | Freq: Every day | ORAL | Status: DC

## 2011-09-19 MED ORDER — MELOXICAM 15 MG PO TABS: 15.0000 mg | ORAL_TABLET | Freq: Every morning | ORAL | Status: DC

## 2011-09-19 MED ORDER — DILTIAZEM HCL ER COATED BEADS 180 MG PO CP24: 360.0000 mg | ORAL_CAPSULE | Freq: Every morning | ORAL | Status: DC

## 2011-09-19 MED ORDER — POTASSIUM CHLORIDE ER 10 MEQ PO TBCR: 10.0000 meq | EXTENDED_RELEASE_TABLET | Freq: Every day | ORAL | Status: DC

## 2011-09-19 NOTE — Assessment & Plan Note (Signed)
Diet controlled Check a1c now and advised attn to weight/diet as ongoing Lab Results  Component Value Date   HGBA1C 5.9 03/22/2011

## 2011-09-19 NOTE — Patient Instructions (Signed)
It was good to see you today. Test(s) ordered today. Your results will be called to you after review (48-72hours after test completion). If any changes need to be made, you will be notified at that time. Medications reviewed and updated, no changes at this time.  Refill on medication(s) as discussed today. Please schedule follow up in 6 months, call sooner if problems.

## 2011-09-19 NOTE — Assessment & Plan Note (Signed)
Chronic symptoms - on lexapro 2000-07/2011 Other med illness "stable" per other specialists Changed to cymbalta 07/2011 - doing well improved motivational fatigue and hypersomnia The current medical regimen is effective;  continue present plan and medications.

## 2011-09-19 NOTE — Assessment & Plan Note (Signed)
rx'd statin due to hx CAD, but not taking crestor since 06/2011 due to leg pain (which has improved off med) recheck lipids now

## 2011-09-19 NOTE — Progress Notes (Signed)
Subjective:    Patient ID: [name], male    DOB: 11-13-1927, 78 y.o.   MRN: [id number]  HPI  Here for follow up - reviewed chronic med issues:  DM2 - off all meds since 01/2010 - prev on starlix for years - a1c 5.6 preop 01/2010 - does not check cbg at home, no signs or symptoms hyper/hypoglycemia - weight gain reviewed  OSA - follows with pulm/sleep specialist for same - on CPAP, less frequently falling asleep in daytime  prostate cancer hx- s/p resection 1993 - follows with uro q6-15mo- ([facility]) - no voiding issues or change in flow  depression - exac by illness and hosp early 2012. changed lexapro to cymbalta 07/2011 and wife reports improved energy/mood - reports compliance with ongoing medical treatment and no changes in medication dose or frequency. denies adverse side effects related to current therapy.   Past Medical History  Diagnosis Date  . Adenomatous colon polyp 03/1993  . Prostate cancer   . Depression   . OSA (obstructive sleep apnea)   . Hearing loss   . Atrial fibrillation     not anticoag candidate due to decond and falls  . Legally blind 1987  . AORTIC STENOSIS     s/p AVR 01/2010  . CLOSTRIDIUM DIFFICILE COLITIS 12/30/2009  . CORONARY ARTERY DISEASE     s/p CABG 01/28/10  . HIP REPLACEMENT, RIGHT, HX OF 2006  . HYPERTROPHY PROSTATE W/UR OBST & OTH LUTS   . MRSA infection 04/2010  . Gout   . THROMBOCYTOPENIA   . Arthritis   . DEGENERATIVE DISC DISEASE   . Diabetes mellitus, type 2   . GERD   . Hyperlipidemia   . Hypertension     Review of Systems  Constitutional: Positive for fatigue. Negative for fever and unexpected weight change.  Respiratory: Negative for cough and shortness of breath.   Cardiovascular: Negative for chest pain and leg swelling.  Neurological: Negative for headaches.      Objective:   Physical Exam  BP 130/60  Pulse 63  Temp 97.7 F (36.5 C) (Oral)  Ht 5\' 10"  (1.778 m)  Wt 230 lb (104.327 kg)  BMI 33.00 kg/m2  SpO2  95% Wt Readings from Last 3 Encounters:  09/19/11 230 lb (104.327 kg)  09/18/11 229 lb 12.8 oz (104.237 kg)  08/07/11 234 lb 12.8 oz (106.505 kg)   Constitutional:  he is overweight, appears well-developed and well-nourished. No distress. Wife at side Neck: Normal range of motion. Neck supple. No JVD present. No thyromegaly present.  Cardiovascular: Normal rate, regular rhythm and normal heart sounds.  No murmur heard. no edema BLE Pulmonary/Chest: Effort normal and breath sounds normal. No respiratory distress. no wheezes.  Neurological: he is alert and oriented to person, place, and time. No cranial nerve deficit. Coordination normal.  Psychiatric: he has a reserved affect but brighter mood and affect. behavior is normal. Judgment and thought content normal.   Lab Results  Component Value Date   WBC 5.9 08/04/2011   HGB 15.1 08/04/2011   HCT 44.2 08/04/2011   PLT 94* 08/04/2011   CHOL 137 03/22/2011   TRIG 209.0* 03/22/2011   HDL 42.90 03/22/2011   LDLDIRECT 73.0 03/22/2011   ALT 21 01/06/2011   AST 20 01/06/2011   NA 140 01/06/2011   K 4.4 01/06/2011   CL 101 01/06/2011   CREATININE 1.09 01/06/2011   BUN 14 01/06/2011   CO2 30 01/06/2011   TSH 1.08 03/22/2011   PSA  0.21 09/13/2010   INR 1.04 12/05/2010   HGBA1C 5.9 03/22/2011        Assessment & Plan:   see problem list. Medications and labs reviewed today.  Fatigue - improved multifactorial -suspect overlap with depression, OSA and other problems -

## 2011-09-20 ENCOUNTER — Encounter (HOSPITAL_COMMUNITY)
Admission: RE | Admit: 2011-09-20 | Discharge: 2011-09-20 | Disposition: A | Discharge status: Home / Self Care | Payer: Self-pay | Source: Ambulatory Visit | Attending: Cardiovascular Disease | Admitting: Cardiovascular Disease

## 2011-09-22 ENCOUNTER — Encounter (HOSPITAL_COMMUNITY)
Admission: RE | Admit: 2011-09-22 | Discharge: 2011-09-22 | Disposition: A | Discharge status: Home / Self Care | Payer: Self-pay | Source: Ambulatory Visit | Attending: Cardiovascular Disease | Admitting: Cardiovascular Disease

## 2011-09-27 ENCOUNTER — Encounter (HOSPITAL_COMMUNITY)
Admission: RE | Admit: 2011-09-27 | Discharge: 2011-09-27 | Disposition: A | Discharge status: Home / Self Care | Payer: Self-pay | Source: Ambulatory Visit | Attending: Cardiovascular Disease | Admitting: Cardiovascular Disease

## 2011-09-27 DIAGNOSIS — Z7982 Long term (current) use of aspirin: Secondary | ICD-10-CM | POA: Insufficient documentation

## 2011-09-27 DIAGNOSIS — I10 Essential (primary) hypertension: Secondary | ICD-10-CM | POA: Insufficient documentation

## 2011-09-27 DIAGNOSIS — K219 Gastro-esophageal reflux disease without esophagitis: Secondary | ICD-10-CM | POA: Insufficient documentation

## 2011-09-27 DIAGNOSIS — I359 Nonrheumatic aortic valve disorder, unspecified: Secondary | ICD-10-CM | POA: Insufficient documentation

## 2011-09-27 DIAGNOSIS — I2510 Atherosclerotic heart disease of native coronary artery without angina pectoris: Secondary | ICD-10-CM | POA: Insufficient documentation

## 2011-09-27 DIAGNOSIS — Z5189 Encounter for other specified aftercare: Secondary | ICD-10-CM | POA: Insufficient documentation

## 2011-09-27 DIAGNOSIS — E785 Hyperlipidemia, unspecified: Secondary | ICD-10-CM | POA: Insufficient documentation

## 2011-09-27 DIAGNOSIS — Z951 Presence of aortocoronary bypass graft: Secondary | ICD-10-CM | POA: Insufficient documentation

## 2011-09-27 DIAGNOSIS — R5381 Other malaise: Secondary | ICD-10-CM | POA: Insufficient documentation

## 2011-09-27 DIAGNOSIS — I739 Peripheral vascular disease, unspecified: Secondary | ICD-10-CM | POA: Insufficient documentation

## 2011-09-27 DIAGNOSIS — N289 Disorder of kidney and ureter, unspecified: Secondary | ICD-10-CM | POA: Insufficient documentation

## 2011-09-27 DIAGNOSIS — Z87891 Personal history of nicotine dependence: Secondary | ICD-10-CM | POA: Insufficient documentation

## 2011-09-27 DIAGNOSIS — E119 Type 2 diabetes mellitus without complications: Secondary | ICD-10-CM | POA: Insufficient documentation

## 2011-09-27 DIAGNOSIS — G4733 Obstructive sleep apnea (adult) (pediatric): Secondary | ICD-10-CM | POA: Insufficient documentation

## 2011-09-27 DIAGNOSIS — I4891 Unspecified atrial fibrillation: Secondary | ICD-10-CM | POA: Insufficient documentation

## 2011-09-27 DIAGNOSIS — H548 Legal blindness, as defined in USA: Secondary | ICD-10-CM | POA: Insufficient documentation

## 2011-09-27 DIAGNOSIS — Z954 Presence of other heart-valve replacement: Secondary | ICD-10-CM | POA: Insufficient documentation

## 2011-09-29 ENCOUNTER — Encounter (HOSPITAL_COMMUNITY): Payer: Self-pay

## 2011-10-02 ENCOUNTER — Encounter (HOSPITAL_COMMUNITY)
Admission: RE | Admit: 2011-10-02 | Discharge: 2011-10-02 | Disposition: A | Discharge status: Home / Self Care | Payer: Self-pay | Source: Ambulatory Visit | Attending: Cardiovascular Disease | Admitting: Cardiovascular Disease

## 2011-10-04 ENCOUNTER — Encounter (HOSPITAL_COMMUNITY)
Admission: RE | Admit: 2011-10-04 | Discharge: 2011-10-04 | Disposition: A | Discharge status: Home / Self Care | Payer: Self-pay | Source: Ambulatory Visit | Attending: Cardiovascular Disease | Admitting: Cardiovascular Disease

## 2011-10-06 ENCOUNTER — Encounter (HOSPITAL_COMMUNITY)
Admission: RE | Admit: 2011-10-06 | Discharge: 2011-10-06 | Disposition: A | Discharge status: Home / Self Care | Payer: Self-pay | Source: Ambulatory Visit | Attending: Cardiovascular Disease | Admitting: Cardiovascular Disease

## 2011-10-09 ENCOUNTER — Encounter (HOSPITAL_COMMUNITY): Payer: Self-pay

## 2011-10-11 ENCOUNTER — Encounter (HOSPITAL_COMMUNITY)
Admission: RE | Admit: 2011-10-11 | Discharge: 2011-10-11 | Disposition: A | Discharge status: Home / Self Care | Payer: Self-pay | Source: Ambulatory Visit | Attending: Cardiovascular Disease | Admitting: Cardiovascular Disease

## 2011-10-13 ENCOUNTER — Encounter (HOSPITAL_COMMUNITY)
Admission: RE | Admit: 2011-10-13 | Discharge: 2011-10-13 | Disposition: A | Discharge status: Home / Self Care | Payer: Self-pay | Source: Ambulatory Visit | Attending: Cardiovascular Disease | Admitting: Cardiovascular Disease

## 2011-10-16 ENCOUNTER — Encounter (HOSPITAL_COMMUNITY)
Admission: RE | Admit: 2011-10-16 | Discharge: 2011-10-16 | Disposition: A | Discharge status: Home / Self Care | Payer: Self-pay | Source: Ambulatory Visit | Attending: Cardiovascular Disease | Admitting: Cardiovascular Disease

## 2011-10-18 ENCOUNTER — Encounter (HOSPITAL_COMMUNITY)
Admission: RE | Admit: 2011-10-18 | Discharge: 2011-10-18 | Disposition: A | Discharge status: Home / Self Care | Payer: Self-pay | Source: Ambulatory Visit | Attending: Cardiovascular Disease | Admitting: Cardiovascular Disease

## 2011-10-20 ENCOUNTER — Encounter (HOSPITAL_COMMUNITY)
Admission: RE | Admit: 2011-10-20 | Discharge: 2011-10-20 | Disposition: A | Discharge status: Home / Self Care | Payer: Self-pay | Source: Ambulatory Visit | Attending: Cardiovascular Disease | Admitting: Cardiovascular Disease

## 2011-10-23 ENCOUNTER — Encounter (HOSPITAL_COMMUNITY)
Admission: RE | Admit: 2011-10-23 | Discharge: 2011-10-23 | Disposition: A | Discharge status: Home / Self Care | Payer: Self-pay | Source: Ambulatory Visit | Attending: Cardiovascular Disease | Admitting: Cardiovascular Disease

## 2011-10-25 ENCOUNTER — Encounter (HOSPITAL_COMMUNITY)
Admission: RE | Admit: 2011-10-25 | Discharge: 2011-10-25 | Disposition: A | Discharge status: Home / Self Care | Payer: Self-pay | Source: Ambulatory Visit | Attending: Cardiovascular Disease | Admitting: Cardiovascular Disease

## 2011-10-25 DIAGNOSIS — K219 Gastro-esophageal reflux disease without esophagitis: Secondary | ICD-10-CM | POA: Insufficient documentation

## 2011-10-25 DIAGNOSIS — Z954 Presence of other heart-valve replacement: Secondary | ICD-10-CM | POA: Insufficient documentation

## 2011-10-25 DIAGNOSIS — E119 Type 2 diabetes mellitus without complications: Secondary | ICD-10-CM | POA: Insufficient documentation

## 2011-10-25 DIAGNOSIS — R5381 Other malaise: Secondary | ICD-10-CM | POA: Insufficient documentation

## 2011-10-25 DIAGNOSIS — I2510 Atherosclerotic heart disease of native coronary artery without angina pectoris: Secondary | ICD-10-CM | POA: Insufficient documentation

## 2011-10-25 DIAGNOSIS — I10 Essential (primary) hypertension: Secondary | ICD-10-CM | POA: Insufficient documentation

## 2011-10-25 DIAGNOSIS — I739 Peripheral vascular disease, unspecified: Secondary | ICD-10-CM | POA: Insufficient documentation

## 2011-10-25 DIAGNOSIS — E785 Hyperlipidemia, unspecified: Secondary | ICD-10-CM | POA: Insufficient documentation

## 2011-10-25 DIAGNOSIS — Z951 Presence of aortocoronary bypass graft: Secondary | ICD-10-CM | POA: Insufficient documentation

## 2011-10-25 DIAGNOSIS — G4733 Obstructive sleep apnea (adult) (pediatric): Secondary | ICD-10-CM | POA: Insufficient documentation

## 2011-10-25 DIAGNOSIS — Z5189 Encounter for other specified aftercare: Secondary | ICD-10-CM | POA: Insufficient documentation

## 2011-10-25 DIAGNOSIS — I4891 Unspecified atrial fibrillation: Secondary | ICD-10-CM | POA: Insufficient documentation

## 2011-10-25 DIAGNOSIS — Z87891 Personal history of nicotine dependence: Secondary | ICD-10-CM | POA: Insufficient documentation

## 2011-10-25 DIAGNOSIS — Z7982 Long term (current) use of aspirin: Secondary | ICD-10-CM | POA: Insufficient documentation

## 2011-10-25 DIAGNOSIS — I359 Nonrheumatic aortic valve disorder, unspecified: Secondary | ICD-10-CM | POA: Insufficient documentation

## 2011-10-25 DIAGNOSIS — N289 Disorder of kidney and ureter, unspecified: Secondary | ICD-10-CM | POA: Insufficient documentation

## 2011-10-25 DIAGNOSIS — H548 Legal blindness, as defined in USA: Secondary | ICD-10-CM | POA: Insufficient documentation

## 2011-10-27 ENCOUNTER — Encounter (HOSPITAL_COMMUNITY)
Admission: RE | Admit: 2011-10-27 | Discharge: 2011-10-27 | Disposition: A | Discharge status: Home / Self Care | Payer: Self-pay | Source: Ambulatory Visit | Attending: Cardiovascular Disease | Admitting: Cardiovascular Disease

## 2011-10-30 ENCOUNTER — Encounter (HOSPITAL_COMMUNITY)
Admission: RE | Admit: 2011-10-30 | Discharge: 2011-10-30 | Disposition: A | Discharge status: Home / Self Care | Payer: Self-pay | Source: Ambulatory Visit | Attending: Cardiovascular Disease | Admitting: Cardiovascular Disease

## 2011-10-31 ENCOUNTER — Ambulatory Visit: Payer: Medicare Other | Admitting: Internal Medicine

## 2011-11-01 ENCOUNTER — Encounter (HOSPITAL_COMMUNITY)
Admission: RE | Admit: 2011-11-01 | Discharge: 2011-11-01 | Disposition: A | Discharge status: Home / Self Care | Payer: Self-pay | Source: Ambulatory Visit | Attending: Cardiovascular Disease | Admitting: Cardiovascular Disease

## 2011-11-03 ENCOUNTER — Encounter (HOSPITAL_COMMUNITY)
Admission: RE | Admit: 2011-11-03 | Discharge: 2011-11-03 | Disposition: A | Discharge status: Home / Self Care | Payer: Self-pay | Source: Ambulatory Visit | Attending: Cardiovascular Disease | Admitting: Cardiovascular Disease

## 2011-11-06 ENCOUNTER — Encounter (HOSPITAL_COMMUNITY)
Admission: RE | Admit: 2011-11-06 | Discharge: 2011-11-06 | Disposition: A | Discharge status: Home / Self Care | Payer: Self-pay | Source: Ambulatory Visit | Attending: Cardiovascular Disease | Admitting: Cardiovascular Disease

## 2011-11-08 ENCOUNTER — Encounter (HOSPITAL_COMMUNITY)
Admission: RE | Admit: 2011-11-08 | Discharge: 2011-11-08 | Disposition: A | Discharge status: Home / Self Care | Payer: Self-pay | Source: Ambulatory Visit | Attending: Cardiovascular Disease | Admitting: Cardiovascular Disease

## 2011-11-10 ENCOUNTER — Encounter (HOSPITAL_COMMUNITY): Payer: Self-pay

## 2011-11-13 ENCOUNTER — Encounter (HOSPITAL_COMMUNITY)
Admission: RE | Admit: 2011-11-13 | Discharge: 2011-11-13 | Disposition: A | Discharge status: Home / Self Care | Payer: Self-pay | Source: Ambulatory Visit | Attending: Cardiovascular Disease | Admitting: Cardiovascular Disease

## 2011-11-14 ENCOUNTER — Ambulatory Visit (INDEPENDENT_AMBULATORY_CARE_PROVIDER_SITE_OTHER): Payer: Medicare Other | Admitting: Family Medicine

## 2011-11-14 ENCOUNTER — Encounter: Payer: Self-pay | Admitting: Family Medicine

## 2011-11-14 VITALS — BP 130/70 | HR 76 | Temp 98.7°F | Wt 231.0 lb

## 2011-11-14 DIAGNOSIS — L989 Disorder of the skin and subcutaneous tissue, unspecified: Secondary | ICD-10-CM

## 2011-11-14 DIAGNOSIS — J329 Chronic sinusitis, unspecified: Secondary | ICD-10-CM

## 2011-11-14 MED ORDER — MUPIROCIN 2 % EX OINT: TOPICAL_OINTMENT | Freq: Three times a day (TID) | CUTANEOUS | Status: DC

## 2011-11-14 MED ORDER — AMOXICILLIN 500 MG PO CAPS: 500.0000 mg | ORAL_CAPSULE | Freq: Three times a day (TID) | ORAL | Status: DC

## 2011-11-14 NOTE — Progress Notes (Signed)
Chief Complaint  Patient presents with  . chest congestion    runny nose, coughing, since Sunday     HPI:  -started: 1 week ago -symptoms:nasal congestion, sore throat, throat congestion, bad breath, cough - productive, worsening -denies:fever, SOB, NVD, tooth pain, strep or mono exposure -has tried: nothing -sick contacts: grandson had similar symptoms -Hx of: pneumonia  Pimple on L buttock: -started last week -hx boil and tx with mupirocin -usually followed by dermatologist  ROS: See pertinent positives and negatives per HPI.  Past Medical History  Diagnosis Date  . Adenomatous colon polyp 03/1993  . Prostate cancer   . Depression   . OSA (obstructive sleep apnea)   . Hearing loss   . Atrial fibrillation     not anticoag candidate due to decond and falls  . Legally blind 1987  . AORTIC STENOSIS     s/p AVR 01/2010  . CLOSTRIDIUM DIFFICILE COLITIS 12/30/2009  . CORONARY ARTERY DISEASE     s/p CABG 01/28/10  . HIP REPLACEMENT, RIGHT, HX OF 2006  . HYPERTROPHY PROSTATE W/UR OBST & OTH LUTS   . MRSA infection 04/2010  . Gout   . THROMBOCYTOPENIA   . Arthritis   . DEGENERATIVE DISC DISEASE   . Diabetes mellitus, type 2   . GERD   . Hyperlipidemia   . Hypertension     Family History  Problem Relation Age of Onset  . Heart disease Mother   . Lung cancer Mother   . Hypertension Brother   . Alcohol abuse Father   . Sudden death Father   . Aneurysm Father   . Heart attack Brother   . Gout Brother   . Arthritis Brother   . Arthritis Other   . Gout Other     History   Social History  . Marital Status: Married    Spouse Name: N/A    Number of Children: 2  . Years of Education: N/A   Occupational History  . Retired     Environmental manager   Social History Main Topics  . Smoking status: Former Smoker -- 1.0 packs/day for 2 years    Types: Cigars    Quit date: 07/01/2009  . Smokeless tobacco: Never Used   Comment: Married, lives with spouse-2 kids. retired  Environmental manager  . Alcohol Use: No  . Drug Use: No  . Sexually Active: None   Other Topics Concern  . None   Social History Narrative  . None    Current outpatient prescriptions:acetaminophen (TYLENOL) 500 MG tablet, Take 650 mg by mouth as needed. , Disp: , Rfl: ;  allopurinol (ZYLOPRIM) 300 MG tablet, Take 1 tablet (300 mg total) by mouth at bedtime., Disp: 90 tablet, Rfl: 2;  Ascorbic Acid (VITAMIN C PO), Take 1 tablet by mouth every morning.  , Disp: , Rfl: ;  aspirin 325 MG tablet, Take 325 mg by mouth every morning. , Disp: , Rfl:  CALCIUM PO, Take 1 tablet by mouth every morning.  , Disp: , Rfl: ;  cetirizine (ZYRTEC ALLERGY) 10 MG tablet, Take 10 mg by mouth daily., Disp: , Rfl: ;  Cholecalciferol (VITAMIN D-3 PO), Take 2,000 Units by mouth daily.  , Disp: , Rfl: ;  diltiazem (CARDIZEM CD) 180 MG 24 hr capsule, Take 2 capsules (360 mg total) by mouth every morning., Disp: 180 capsule, Rfl: 3 docusate sodium (COLACE) 100 MG capsule, Take 100 mg by mouth daily.  , Disp: , Rfl: ;  DULoxetine (CYMBALTA) 20 MG capsule,  Take 1 capsule (20 mg total) by mouth daily., Disp: 90 capsule, Rfl: 3;  furosemide (LASIX) 40 MG tablet, Take 0.5 tablets (20 mg total) by mouth daily., Disp: 45 tablet, Rfl: 3;  meloxicam (MOBIC) 15 MG tablet, Take 1 tablet (15 mg total) by mouth every morning., Disp: 90 tablet, Rfl: 1 metoprolol tartrate (LOPRESSOR) 25 MG tablet, Take 1 tablet (25 mg total) by mouth 2 (two) times daily., Disp: 180 tablet, Rfl: 3;  Multiple Vitamins-Minerals (ICAPS MV PO), Take 1 tablet by mouth every morning.  , Disp: , Rfl: ;  omeprazole (PRILOSEC) 20 MG capsule, Take 1 capsule (20 mg total) by mouth daily after breakfast., Disp: 90 capsule, Rfl: 3 potassium chloride (K-DUR) 10 MEQ tablet, Take 1 tablet (10 mEq total) by mouth daily., Disp: 90 tablet, Rfl: 3;  Pyridoxine HCl (VITAMIN B-6 PO), Take 1 tablet by mouth every morning.  , Disp: , Rfl: ;  ranitidine (ZANTAC) 150 MG tablet, Take 0.5  tablets (75 mg total) by mouth every morning., Disp: 45 tablet, Rfl: 3;  rOPINIRole (REQUIP) 0.25 MG tablet, Take 3 tablets (0.75 mg total) by mouth at bedtime., Disp: 270 tablet, Rfl: 2 rosuvastatin (CRESTOR) 10 MG tablet, Take 1 tablet (10 mg total) by mouth at bedtime. On HOLD since 06/2011, Disp: 90 tablet, Rfl: 3;  Sertaconazole Nitrate 2 % CREA, Apply topically as needed. Athletes foot , Disp: , Rfl: ;  Thiamine HCl (VITAMIN B-1 PO), Take 1 tablet by mouth every morning.  , Disp: , Rfl: ;  amoxicillin (AMOXIL) 500 MG capsule, Take 1 capsule (500 mg total) by mouth 3 (three) times daily., Disp: 30 capsule, Rfl: 0 mupirocin ointment (BACTROBAN) 2 %, Apply topically 3 (three) times daily., Disp: 22 g, Rfl: 0  EXAM:  Filed Vitals:   11/14/11 1334  BP: 130/70  Pulse: 76  Temp: 98.7 F (37.1 C)    There is no height on file to calculate BMI.  GENERAL: vitals reviewed and listed above, alert, oriented, appears well hydrated and in no acute distress  HEENT: atraumatic, conjunttiva clear, no obvious abnormalities on inspection of external nose and ears, normal appearance of ear canals and TMs, clear nasal congestion, mild post oropharyngeal erythema with PND, no tonsillar edema or exudate, no sinus TTP  NECK: no obvious masses on inspection  LUNGS: clear to auscultation bilaterally, no wheezes, rales or rhonchi, good air movement  CV: HRRR, no peripheral edema  SKIN: very small erythematous papule with eschar   MS: moves all extremities without noticeable abnormality  PSYCH: pleasant and cooperative, no obvious depression or anxiety  ASSESSMENT AND PLAN:  Discussed the following assessment and plan:  1. Skin lesion  mupirocin ointment (BACTROBAN) 2 %  2. Sinusitis  amoxicillin (AMOXIL) 500 MG capsule   -abx and supportive care for URI, with >1week and worsening and co-morbidities abx warranted, risks discussed, supportive measures, follow up with PCP -Patient advised to return or  notify a doctor immediately if symptoms worsen or persist or new concerns arise.  Patient Instructions  INSTRUCTIONS FOR UPPER RESPIRATORY INFECTION:  -plenty of rest and fluids  -nasal saline wash 2-3 times daily (use prepackaged nasal saline or bottled/distilled water if making your own)   -clean nose with nasal saline before using the nasal steroid or sinex  -can use sinex nasal spray for drainage and nasal congestion - but do NOT use longer then 3-4 days  -can use tylenol or ibuprofen as directed for aches and sorethroat  -in the winter time,  using a humidifier at night is helpful (please follow cleaning instructions)  -if you are taking a cough medication - use only as directed, may also try a teaspoon of honey to coat the throat and throat lozenges  -for sore throat, salt water gargles can help  -follow up  With your docotr if you have fevers, are worsening or not getting better in 3-4 days   FOR SKIN LESION: -warm compreses twice daily -antibiotic ointment twice daily -follow up with your dermatologist or doctor in 2-3 days or sooner if worsening or fevers      KIM, HANNAH R.

## 2011-11-14 NOTE — Patient Instructions (Addendum)
INSTRUCTIONS FOR UPPER RESPIRATORY INFECTION:  -plenty of rest and fluids  -nasal saline wash 2-3 times daily (use prepackaged nasal saline or bottled/distilled water if making your own)   -clean nose with nasal saline before using the nasal steroid or sinex  -can use sinex nasal spray for drainage and nasal congestion - but do NOT use longer then 3-4 days  -can use tylenol or ibuprofen as directed for aches and sorethroat  -in the winter time, using a humidifier at night is helpful (please follow cleaning instructions)  -if you are taking a cough medication - use only as directed, may also try a teaspoon of honey to coat the throat and throat lozenges  -for sore throat, salt water gargles can help  -follow up  With your docotr if you have fevers, are worsening or not getting better in 3-4 days   FOR SKIN LESION: -warm compreses twice daily -antibiotic ointment twice daily -follow up with your dermatologist or doctor in 2-3 days or sooner if worsening or fevers

## 2011-11-15 ENCOUNTER — Encounter (HOSPITAL_COMMUNITY): Payer: Self-pay

## 2011-11-17 ENCOUNTER — Encounter (HOSPITAL_COMMUNITY): Payer: Self-pay

## 2011-11-20 ENCOUNTER — Encounter (HOSPITAL_COMMUNITY)
Admission: RE | Admit: 2011-11-20 | Discharge: 2011-11-20 | Disposition: A | Discharge status: Home / Self Care | Payer: Self-pay | Source: Ambulatory Visit | Attending: Cardiovascular Disease | Admitting: Cardiovascular Disease

## 2011-11-22 ENCOUNTER — Encounter (HOSPITAL_COMMUNITY)
Admission: RE | Admit: 2011-11-22 | Discharge: 2011-11-22 | Disposition: A | Discharge status: Home / Self Care | Payer: Self-pay | Source: Ambulatory Visit | Attending: Cardiovascular Disease | Admitting: Cardiovascular Disease

## 2011-11-24 ENCOUNTER — Encounter (HOSPITAL_COMMUNITY)
Admission: RE | Admit: 2011-11-24 | Discharge: 2011-11-24 | Disposition: A | Discharge status: Home / Self Care | Payer: Self-pay | Source: Ambulatory Visit | Attending: Cardiovascular Disease | Admitting: Cardiovascular Disease

## 2011-11-24 DIAGNOSIS — Z951 Presence of aortocoronary bypass graft: Secondary | ICD-10-CM | POA: Insufficient documentation

## 2011-11-24 DIAGNOSIS — I2510 Atherosclerotic heart disease of native coronary artery without angina pectoris: Secondary | ICD-10-CM | POA: Insufficient documentation

## 2011-11-24 DIAGNOSIS — Z5189 Encounter for other specified aftercare: Secondary | ICD-10-CM | POA: Insufficient documentation

## 2011-11-24 DIAGNOSIS — R5381 Other malaise: Secondary | ICD-10-CM | POA: Insufficient documentation

## 2011-11-24 DIAGNOSIS — E119 Type 2 diabetes mellitus without complications: Secondary | ICD-10-CM | POA: Insufficient documentation

## 2011-11-24 DIAGNOSIS — N289 Disorder of kidney and ureter, unspecified: Secondary | ICD-10-CM | POA: Insufficient documentation

## 2011-11-24 DIAGNOSIS — Z954 Presence of other heart-valve replacement: Secondary | ICD-10-CM | POA: Insufficient documentation

## 2011-11-24 DIAGNOSIS — H548 Legal blindness, as defined in USA: Secondary | ICD-10-CM | POA: Insufficient documentation

## 2011-11-24 DIAGNOSIS — Z7982 Long term (current) use of aspirin: Secondary | ICD-10-CM | POA: Insufficient documentation

## 2011-11-24 DIAGNOSIS — Z87891 Personal history of nicotine dependence: Secondary | ICD-10-CM | POA: Insufficient documentation

## 2011-11-24 DIAGNOSIS — I739 Peripheral vascular disease, unspecified: Secondary | ICD-10-CM | POA: Insufficient documentation

## 2011-11-24 DIAGNOSIS — I10 Essential (primary) hypertension: Secondary | ICD-10-CM | POA: Insufficient documentation

## 2011-11-24 DIAGNOSIS — E785 Hyperlipidemia, unspecified: Secondary | ICD-10-CM | POA: Insufficient documentation

## 2011-11-24 DIAGNOSIS — G4733 Obstructive sleep apnea (adult) (pediatric): Secondary | ICD-10-CM | POA: Insufficient documentation

## 2011-11-24 DIAGNOSIS — I4891 Unspecified atrial fibrillation: Secondary | ICD-10-CM | POA: Insufficient documentation

## 2011-11-24 DIAGNOSIS — I359 Nonrheumatic aortic valve disorder, unspecified: Secondary | ICD-10-CM | POA: Insufficient documentation

## 2011-11-24 DIAGNOSIS — K219 Gastro-esophageal reflux disease without esophagitis: Secondary | ICD-10-CM | POA: Insufficient documentation

## 2011-11-27 ENCOUNTER — Encounter (HOSPITAL_COMMUNITY): Payer: Self-pay

## 2011-11-29 ENCOUNTER — Encounter (HOSPITAL_COMMUNITY)
Admission: RE | Admit: 2011-11-29 | Discharge: 2011-11-29 | Disposition: A | Discharge status: Home / Self Care | Payer: Self-pay | Source: Ambulatory Visit | Attending: Cardiovascular Disease | Admitting: Cardiovascular Disease

## 2011-12-01 ENCOUNTER — Encounter (HOSPITAL_COMMUNITY)
Admission: RE | Admit: 2011-12-01 | Discharge: 2011-12-01 | Disposition: A | Discharge status: Home / Self Care | Payer: Self-pay | Source: Ambulatory Visit | Attending: Cardiovascular Disease | Admitting: Cardiovascular Disease

## 2011-12-04 ENCOUNTER — Encounter: Payer: Self-pay | Admitting: Internal Medicine

## 2011-12-04 ENCOUNTER — Other Ambulatory Visit (INDEPENDENT_AMBULATORY_CARE_PROVIDER_SITE_OTHER): Payer: Medicare Other

## 2011-12-04 ENCOUNTER — Ambulatory Visit (INDEPENDENT_AMBULATORY_CARE_PROVIDER_SITE_OTHER): Payer: Medicare Other | Admitting: Internal Medicine

## 2011-12-04 ENCOUNTER — Encounter (HOSPITAL_COMMUNITY): Payer: Self-pay

## 2011-12-04 VITALS — BP 128/58 | HR 67 | Temp 97.0°F | Ht 70.0 in | Wt 230.0 lb

## 2011-12-04 DIAGNOSIS — R531 Weakness: Secondary | ICD-10-CM

## 2011-12-04 DIAGNOSIS — D696 Thrombocytopenia, unspecified: Secondary | ICD-10-CM

## 2011-12-04 DIAGNOSIS — R269 Unspecified abnormalities of gait and mobility: Secondary | ICD-10-CM

## 2011-12-04 DIAGNOSIS — M48061 Spinal stenosis, lumbar region without neurogenic claudication: Secondary | ICD-10-CM

## 2011-12-04 DIAGNOSIS — E785 Hyperlipidemia, unspecified: Secondary | ICD-10-CM

## 2011-12-04 DIAGNOSIS — R5383 Other fatigue: Secondary | ICD-10-CM

## 2011-12-04 DIAGNOSIS — R5381 Other malaise: Secondary | ICD-10-CM

## 2011-12-04 MED ORDER — BACLOFEN 10 MG PO TABS: 10.0000 mg | ORAL_TABLET | Freq: Three times a day (TID) | ORAL | Status: DC | PRN

## 2011-12-04 NOTE — Patient Instructions (Signed)
It was good to see you today. We have reviewed your prior records including labs and tests today Test(s) ordered today. Your results will be released to MyChart (or called to you) after review, usually within 72hours after test completion. If any changes need to be made, you will be notified at that same time. we'll make referral for MRI brain. Our office will contact you regarding appointment(s) once made. Use Baclofen for muscle spasm/shoulder blade tightness -  Ok to resume meloxicam Your prescription(s) have been submitted to your pharmacy. Please take as directed and contact our office if you believe you are having problem(s) with the medication(s).

## 2011-12-04 NOTE — Assessment & Plan Note (Signed)
rx'd statin due to hx CAD Stopped taking crestor 06/2011 - 09/2011 due to leg pain (unchanged with or without med) recheck lipids now

## 2011-12-04 NOTE — Progress Notes (Signed)
Subjective:    Patient ID: [name], male    DOB: [birth year], 78 y.o.   MRN: [id number]  HPI  Here for fatigue - see CC Generalized weakness - legs >UE - change in gait over past 6 mo per wife Also positional dizziness - orthostatic symptoms - Hx fall 3 weeks ago related to same while working in yard   Also reviewed chronic med issues:  DM2 - off all meds since 01/2010 - prev on starlix for years - a1c 5.6 preop 01/2010 - does not check cbg at home, no signs or symptoms hyper/hypoglycemia - weight gain reviewed  OSA - follows with pulm/sleep specialist for same -since on CPAP, less frequently falling asleep in daytime  prostate cancer hx- s/p resection 1993 - follows with uro q6-18mo- ([facility]) - no voiding issues or change in flow  depression - exac by illness and hosp early 2012. changed lexapro to cymbalta 07/2011 and wife reports improved energy/mood - reports compliance with ongoing medical treatment and no changes in medication dose or frequency. denies adverse side effects related to current therapy.   Past Medical History  Diagnosis Date  . Adenomatous colon polyp 03/1993  . Prostate cancer   . Depression   . OSA (obstructive sleep apnea)   . Hearing loss   . Atrial fibrillation     not anticoag candidate due to decond and falls  . Legally blind 1987  . AORTIC STENOSIS     s/p AVR 01/2010  . CLOSTRIDIUM DIFFICILE COLITIS 12/30/2009  . CORONARY ARTERY DISEASE     s/p CABG 01/28/10  . HIP REPLACEMENT, RIGHT, HX OF 2006  . HYPERTROPHY PROSTATE W/UR OBST & OTH LUTS   . MRSA infection 04/2010  . Gout   . THROMBOCYTOPENIA   . Arthritis   . DEGENERATIVE DISC DISEASE   . Diabetes mellitus, type 2   . GERD   . Hyperlipidemia   . Hypertension     Review of Systems  Constitutional: Positive for fatigue. Negative for fever and unexpected weight change.  Respiratory: Negative for cough and shortness of breath.   Cardiovascular: Negative for chest pain and leg  swelling.  Neurological: Negative for headaches.      Objective:   Physical Exam  BP 128/58  Pulse 67  Temp 97 F (36.1 C) (Oral)  Ht 5\' 10"  (1.778 m)  Wt 230 lb (104.327 kg)  BMI 33.00 kg/m2  SpO2 96% Wt Readings from Last 3 Encounters:  12/04/11 230 lb (104.327 kg)  11/14/11 231 lb (104.781 kg)  09/19/11 230 lb (104.327 kg)   Constitutional:  he is overweight, appears well-developed and well-nourished. No distress. Wife at side Neck: Normal range of motion. Neck supple. No JVD present. No thyromegaly present.  Cardiovascular: Normal rate, regular rhythm and normal heart sounds.  No murmur heard. no edema BLE Pulmonary/Chest: Effort normal and breath sounds normal. No respiratory distress. no wheezes.  Neurological: he is alert and oriented to person, place, and time. No cranial nerve deficit. Coordination slow but intact, cautious high stepping gait with mild quad weakness (bend forward to stand from chair/sitting position). Hyperreflexic  Psychiatric: he has a reserved affect but bright mood and affect. behavior is normal. Judgment and thought content normal.   Lab Results  Component Value Date   WBC 5.9 08/04/2011   HGB 15.1 08/04/2011   HCT 44.2 08/04/2011   PLT 94* 08/04/2011   CHOL 252* 09/19/2011   TRIG 167.0* 09/19/2011   HDL 52.00 09/19/2011  LDLDIRECT 174.3 09/19/2011   ALT 24 09/19/2011   AST 18 09/19/2011   NA 142 09/19/2011   K 4.1 09/19/2011   CL 103 09/19/2011   CREATININE 0.9 09/19/2011   BUN 15 09/19/2011   CO2 28 09/19/2011   TSH 1.55 09/19/2011   PSA 0.21 09/13/2010   INR 1.04 12/05/2010   HGBA1C 6.0 09/19/2011        Assessment & Plan:   see problem list. Medications and labs reviewed today.  Generalized fatigue -associated with fatigue and hypersomnia -  Also change in gait over past 6 months: slow and high stepping on exam - ?myelopathic Initially symptoms felt to be multifactorial: overlap with depression, OSA and other medical problems -   Check ESR  and CK Check MRI brain and c-spine Consider neuro refer

## 2011-12-05 DIAGNOSIS — M48061 Spinal stenosis, lumbar region without neurogenic claudication: Secondary | ICD-10-CM | POA: Insufficient documentation

## 2011-12-05 LAB — LIPID PANEL
Cholesterol: 151 mg/dL (ref 0–200)
HDL: 44.8 mg/dL (ref >39.00)
Total CHOL/HDL Ratio: 3
VLDL: 42.8 mg/dL — ABNORMAL HIGH (ref 0.0–40.0)

## 2011-12-05 LAB — CK: Total CK: 37 U/L (ref 7–232)

## 2011-12-05 NOTE — Assessment & Plan Note (Signed)
MRI L spine 10/2009: severe spinal stenosis L4-5, multilevel DDD Consider symptomatic lumbar stenosis with BLE weakness, but exclude cervical stenosis 1st given hyperreflexia, UE weakness and gait disorder

## 2011-12-06 ENCOUNTER — Encounter (HOSPITAL_COMMUNITY)
Admission: RE | Admit: 2011-12-06 | Discharge: 2011-12-06 | Disposition: A | Discharge status: Home / Self Care | Payer: Self-pay | Source: Ambulatory Visit | Attending: Cardiovascular Disease | Admitting: Cardiovascular Disease

## 2011-12-08 ENCOUNTER — Encounter (HOSPITAL_COMMUNITY)
Admission: RE | Admit: 2011-12-08 | Discharge: 2011-12-08 | Disposition: A | Discharge status: Home / Self Care | Payer: Self-pay | Source: Ambulatory Visit | Attending: Cardiovascular Disease | Admitting: Cardiovascular Disease

## 2011-12-11 ENCOUNTER — Encounter (HOSPITAL_COMMUNITY)
Admission: RE | Admit: 2011-12-11 | Discharge: 2011-12-11 | Disposition: A | Discharge status: Home / Self Care | Payer: Self-pay | Source: Ambulatory Visit | Attending: Cardiovascular Disease | Admitting: Cardiovascular Disease

## 2011-12-11 ENCOUNTER — Ambulatory Visit
Admission: RE | Admit: 2011-12-11 | Discharge: 2011-12-11 | Disposition: A | Discharge status: Home / Self Care | Payer: Medicare Other | Source: Ambulatory Visit | Attending: Internal Medicine | Admitting: Internal Medicine

## 2011-12-11 DIAGNOSIS — R531 Weakness: Secondary | ICD-10-CM

## 2011-12-11 DIAGNOSIS — M48061 Spinal stenosis, lumbar region without neurogenic claudication: Secondary | ICD-10-CM

## 2011-12-11 DIAGNOSIS — R269 Unspecified abnormalities of gait and mobility: Secondary | ICD-10-CM

## 2011-12-12 ENCOUNTER — Other Ambulatory Visit: Payer: Self-pay | Admitting: Internal Medicine

## 2011-12-12 DIAGNOSIS — R531 Weakness: Secondary | ICD-10-CM

## 2011-12-12 DIAGNOSIS — M5000 Cervical disc disorder with myelopathy, unspecified cervical region: Secondary | ICD-10-CM

## 2011-12-13 ENCOUNTER — Encounter (HOSPITAL_COMMUNITY)
Admission: RE | Admit: 2011-12-13 | Discharge: 2011-12-13 | Disposition: A | Discharge status: Home / Self Care | Payer: Self-pay | Source: Ambulatory Visit | Attending: Cardiovascular Disease | Admitting: Cardiovascular Disease

## 2011-12-15 ENCOUNTER — Encounter (HOSPITAL_COMMUNITY)
Admission: RE | Admit: 2011-12-15 | Discharge: 2011-12-15 | Disposition: A | Discharge status: Home / Self Care | Payer: Self-pay | Source: Ambulatory Visit | Attending: Cardiovascular Disease | Admitting: Cardiovascular Disease

## 2011-12-18 ENCOUNTER — Encounter (HOSPITAL_COMMUNITY)
Admission: RE | Admit: 2011-12-18 | Discharge: 2011-12-18 | Disposition: A | Discharge status: Home / Self Care | Payer: Self-pay | Source: Ambulatory Visit | Attending: Cardiovascular Disease | Admitting: Cardiovascular Disease

## 2011-12-20 ENCOUNTER — Encounter (HOSPITAL_COMMUNITY)
Admission: RE | Admit: 2011-12-20 | Discharge: 2011-12-20 | Disposition: A | Discharge status: Home / Self Care | Payer: Self-pay | Source: Ambulatory Visit | Attending: Cardiovascular Disease | Admitting: Cardiovascular Disease

## 2011-12-22 ENCOUNTER — Ambulatory Visit: Payer: Medicare Other | Admitting: Oncology

## 2011-12-22 ENCOUNTER — Telehealth: Payer: Self-pay | Admitting: Oncology

## 2011-12-22 ENCOUNTER — Other Ambulatory Visit: Payer: Medicare Other | Admitting: Lab

## 2011-12-22 NOTE — Progress Notes (Signed)
No show.  Return to clinic prn.  

## 2011-12-22 NOTE — Telephone Encounter (Signed)
pt lvm regarding d/t of nxt appt....i tried to call pt and no vm available.Marland KitchenMarland KitchenMarland Kitchen

## 2011-12-25 ENCOUNTER — Encounter (HOSPITAL_COMMUNITY)
Admission: RE | Admit: 2011-12-25 | Discharge: 2011-12-25 | Disposition: A | Discharge status: Home / Self Care | Payer: Self-pay | Source: Ambulatory Visit | Attending: Cardiovascular Disease | Admitting: Cardiovascular Disease

## 2011-12-25 DIAGNOSIS — G4733 Obstructive sleep apnea (adult) (pediatric): Secondary | ICD-10-CM | POA: Insufficient documentation

## 2011-12-25 DIAGNOSIS — N289 Disorder of kidney and ureter, unspecified: Secondary | ICD-10-CM | POA: Insufficient documentation

## 2011-12-25 DIAGNOSIS — Z951 Presence of aortocoronary bypass graft: Secondary | ICD-10-CM | POA: Insufficient documentation

## 2011-12-25 DIAGNOSIS — I2510 Atherosclerotic heart disease of native coronary artery without angina pectoris: Secondary | ICD-10-CM | POA: Insufficient documentation

## 2011-12-25 DIAGNOSIS — Z954 Presence of other heart-valve replacement: Secondary | ICD-10-CM | POA: Insufficient documentation

## 2011-12-25 DIAGNOSIS — I4891 Unspecified atrial fibrillation: Secondary | ICD-10-CM | POA: Insufficient documentation

## 2011-12-25 DIAGNOSIS — I10 Essential (primary) hypertension: Secondary | ICD-10-CM | POA: Insufficient documentation

## 2011-12-25 DIAGNOSIS — Z7982 Long term (current) use of aspirin: Secondary | ICD-10-CM | POA: Insufficient documentation

## 2011-12-25 DIAGNOSIS — E119 Type 2 diabetes mellitus without complications: Secondary | ICD-10-CM | POA: Insufficient documentation

## 2011-12-25 DIAGNOSIS — H548 Legal blindness, as defined in USA: Secondary | ICD-10-CM | POA: Insufficient documentation

## 2011-12-25 DIAGNOSIS — E785 Hyperlipidemia, unspecified: Secondary | ICD-10-CM | POA: Insufficient documentation

## 2011-12-25 DIAGNOSIS — R5381 Other malaise: Secondary | ICD-10-CM | POA: Insufficient documentation

## 2011-12-25 DIAGNOSIS — I359 Nonrheumatic aortic valve disorder, unspecified: Secondary | ICD-10-CM | POA: Insufficient documentation

## 2011-12-25 DIAGNOSIS — Z5189 Encounter for other specified aftercare: Secondary | ICD-10-CM | POA: Insufficient documentation

## 2011-12-25 DIAGNOSIS — K219 Gastro-esophageal reflux disease without esophagitis: Secondary | ICD-10-CM | POA: Insufficient documentation

## 2011-12-25 DIAGNOSIS — I739 Peripheral vascular disease, unspecified: Secondary | ICD-10-CM | POA: Insufficient documentation

## 2011-12-25 DIAGNOSIS — Z87891 Personal history of nicotine dependence: Secondary | ICD-10-CM | POA: Insufficient documentation

## 2011-12-27 ENCOUNTER — Encounter (HOSPITAL_COMMUNITY)
Admission: RE | Admit: 2011-12-27 | Discharge: 2011-12-27 | Disposition: A | Discharge status: Home / Self Care | Payer: Self-pay | Source: Ambulatory Visit | Attending: Cardiovascular Disease | Admitting: Cardiovascular Disease

## 2011-12-29 ENCOUNTER — Encounter (HOSPITAL_COMMUNITY)
Admission: RE | Admit: 2011-12-29 | Discharge: 2011-12-29 | Disposition: A | Discharge status: Home / Self Care | Payer: Self-pay | Source: Ambulatory Visit | Attending: Cardiovascular Disease | Admitting: Cardiovascular Disease

## 2012-01-01 ENCOUNTER — Encounter (HOSPITAL_COMMUNITY)
Admission: RE | Admit: 2012-01-01 | Discharge: 2012-01-01 | Disposition: A | Discharge status: Home / Self Care | Payer: Self-pay | Source: Ambulatory Visit | Attending: Cardiovascular Disease | Admitting: Cardiovascular Disease

## 2012-01-03 ENCOUNTER — Encounter (HOSPITAL_COMMUNITY)
Admission: RE | Admit: 2012-01-03 | Discharge: 2012-01-03 | Disposition: A | Discharge status: Home / Self Care | Payer: Self-pay | Source: Ambulatory Visit | Attending: Cardiovascular Disease | Admitting: Cardiovascular Disease

## 2012-01-05 ENCOUNTER — Encounter (HOSPITAL_COMMUNITY): Payer: Self-pay

## 2012-01-08 ENCOUNTER — Encounter (HOSPITAL_COMMUNITY)
Admission: RE | Admit: 2012-01-08 | Discharge: 2012-01-08 | Disposition: A | Discharge status: Home / Self Care | Payer: Self-pay | Source: Ambulatory Visit | Attending: Cardiovascular Disease | Admitting: Cardiovascular Disease

## 2012-01-10 ENCOUNTER — Encounter (HOSPITAL_COMMUNITY): Payer: Self-pay

## 2012-01-12 ENCOUNTER — Encounter (HOSPITAL_COMMUNITY): Payer: Self-pay

## 2012-01-15 ENCOUNTER — Encounter (HOSPITAL_COMMUNITY): Payer: Self-pay

## 2012-01-19 ENCOUNTER — Encounter (HOSPITAL_COMMUNITY): Payer: Self-pay

## 2012-01-22 ENCOUNTER — Encounter (HOSPITAL_COMMUNITY): Payer: Self-pay

## 2012-01-24 ENCOUNTER — Encounter (HOSPITAL_COMMUNITY): Payer: Medicare Other

## 2012-01-26 ENCOUNTER — Encounter (HOSPITAL_COMMUNITY): Payer: Medicare Other

## 2012-01-26 ENCOUNTER — Telehealth: Payer: Self-pay | Admitting: Internal Medicine

## 2012-01-26 NOTE — Telephone Encounter (Signed)
Patient Information:  Caller Name: [name]  Phone: [phone]  Patient: [name]  Gender: Male  DOB: 1927-03-07  Age: 79 Years  PCP: Rene Paci (Adults only)  Office Follow Up:  Does the office need to follow up with this patient?: No  Instructions For The Office: N/A  RN Note:  ONset of sinus symptoms with cough 01/24/12.  States recently treated for c-diff, but states she is "afraid he really needs an antibiotic, as he gets pneumonia after sinus infections sometimes."  Afebrile.  Per protocol, emergent symptoms denied; advised appt within 24 hours.  Appt scheduled 01/27/12 1045 with Dr. Laury Axon at [facility] office.   krs/can  Symptoms  Reason For Call & Symptoms: sinus infection; green nasal drainage.  Reviewed Health History In EMR: Yes  Reviewed Medications In EMR: Yes  Reviewed Allergies In EMR: Yes  Reviewed Surgeries / Procedures: Yes  Date of Onset of Symptoms: 01/24/2012  Guideline(s) Used:  Sinus Pain and Congestion  Disposition Per Guideline:   See Today or Tomorrow in Office  Reason For Disposition Reached:   Lots of coughing  Advice Given:  N/A  Appointment Scheduled:  01/27/2012 10:45:00 Appointment Scheduled Provider:  N/A

## 2012-01-27 ENCOUNTER — Ambulatory Visit (INDEPENDENT_AMBULATORY_CARE_PROVIDER_SITE_OTHER): Payer: Medicare Other | Admitting: Family Medicine

## 2012-01-27 ENCOUNTER — Encounter: Payer: Self-pay | Admitting: Family Medicine

## 2012-01-27 VITALS — BP 138/74 | HR 74 | Temp 98.9°F

## 2012-01-27 DIAGNOSIS — J329 Chronic sinusitis, unspecified: Secondary | ICD-10-CM

## 2012-01-27 MED ORDER — FLUTICASONE PROPIONATE 50 MCG/ACT NA SUSP: 2.0000 | Freq: Every day | NASAL | Status: DC

## 2012-01-27 MED ORDER — AMOXICILLIN 875 MG PO TABS: 875.0000 mg | ORAL_TABLET | Freq: Two times a day (BID) | ORAL | Status: DC

## 2012-01-27 NOTE — Progress Notes (Signed)
  Subjective:     [name] is a 79 y.o. male who presents for evaluation of sinus pain. Symptoms include: congestion, cough, facial pain, headaches, nasal congestion, purulent rhinorrhea and sinus pressure. Onset of symptoms was 4 days ago. Symptoms have been gradually worsening since that time. Past history is significant for no history of pneumonia or bronchitis. Patient is a non-smoker.  No otc meds.   Only tylenol.   He has been taking zyrtec and flonase.    The following portions of the patient's history were reviewed and updated as appropriate: allergies, current medications, past family history, past medical history, past social history, past surgical history and problem list.  Review of Systems Pertinent items are noted in HPI.   Objective:    BP 138/74  Pulse 74  Temp 98.9 F (37.2 C) (Oral)  SpO2 95% General appearance: alert, cooperative, appears stated age and no distress Ears: normal TM's and external ear canals both ears Nose: green discharge, moderate congestion, turbinates red, swollen, sinus tenderness bilateral Throat: lips, mucosa, and tongue normal; teeth and gums normal Neck: mild anterior cervical adenopathy, supple, symmetrical, trachea midline and thyroid not enlarged, symmetric, no tenderness/mass/nodules Lungs: clear to auscultation bilaterally Heart: S1, S2 normal Extremities: extremities normal, atraumatic, no cyanosis or edema    Assessment:    Acute bacterial sinusitis.    Plan:    Nasal steroids per medication orders. Antihistamines per medication orders. Amoxicillin per medication orders.

## 2012-01-27 NOTE — Patient Instructions (Signed)

## 2012-01-29 ENCOUNTER — Encounter (HOSPITAL_COMMUNITY): Payer: Medicare Other

## 2012-01-31 ENCOUNTER — Encounter (HOSPITAL_COMMUNITY): Payer: Medicare Other

## 2012-02-01 ENCOUNTER — Ambulatory Visit (INDEPENDENT_AMBULATORY_CARE_PROVIDER_SITE_OTHER)
Admission: RE | Admit: 2012-02-01 | Discharge: 2012-02-01 | Disposition: A | Discharge status: Home / Self Care | Payer: Medicare Other | Source: Ambulatory Visit | Attending: Internal Medicine | Admitting: Internal Medicine

## 2012-02-01 ENCOUNTER — Encounter: Payer: Self-pay | Admitting: Internal Medicine

## 2012-02-01 ENCOUNTER — Ambulatory Visit (INDEPENDENT_AMBULATORY_CARE_PROVIDER_SITE_OTHER): Payer: Medicare Other | Admitting: Internal Medicine

## 2012-02-01 VITALS — BP 120/60 | HR 61 | Temp 98.1°F | Wt 227.8 lb

## 2012-02-01 DIAGNOSIS — R05 Cough: Secondary | ICD-10-CM

## 2012-02-01 DIAGNOSIS — M48061 Spinal stenosis, lumbar region without neurogenic claudication: Secondary | ICD-10-CM

## 2012-02-01 DIAGNOSIS — R059 Cough, unspecified: Secondary | ICD-10-CM

## 2012-02-01 DIAGNOSIS — J329 Chronic sinusitis, unspecified: Secondary | ICD-10-CM

## 2012-02-01 MED ORDER — GUAIFENESIN ER 600 MG PO TB12: 1200.0000 mg | ORAL_TABLET | Freq: Two times a day (BID) | ORAL | Status: DC

## 2012-02-01 NOTE — Assessment & Plan Note (Signed)
MRI L spine 10/2009: severe spinal stenosis L4-5, multilevel DDD symptomatic lumbar stenosis with BLE weakness s/p spine specialist eval (Saullo) with [facility] 01/23/12 - follow up as planned and continue PT

## 2012-02-01 NOTE — Progress Notes (Signed)
  Subjective:    HPI  complains of cough and cold symptoms  Onset >1 week ago, wax/wane symptoms, but worse at this time  Initially associated with rhinorrhea, sneezing, sore throat, mild headache and low grade fever Now productive cough with mild-mod chest congestion, symptoms worse at night No relief with OTC meds Precipitated by sick contacts  Past Medical History  Diagnosis Date  . Adenomatous colon polyp 03/1993  . Prostate cancer   . Depression   . OSA (obstructive sleep apnea)   . Hearing loss   . Atrial fibrillation     not anticoag candidate due to decond and falls  . Legally blind 1987  . AORTIC STENOSIS     s/p AVR 01/2010  . CLOSTRIDIUM DIFFICILE COLITIS 12/30/2009  . CORONARY ARTERY DISEASE     s/p CABG 01/28/10  . HIP REPLACEMENT, RIGHT, HX OF 2006  . HYPERTROPHY PROSTATE W/UR OBST & OTH LUTS   . MRSA infection 04/2010  . Gout   . THROMBOCYTOPENIA   . Arthritis   . DEGENERATIVE DISC DISEASE   . Diabetes mellitus, type 2   . GERD   . Hyperlipidemia   . Hypertension     Review of Systems Constitutional: No unexpected weight change Pulmonary: No pleurisy or hemoptysis Cardiovascular: No chest pain or palpitations     Objective:   Physical Exam BP 120/60  Pulse 61  Temp 98.1 F (36.7 C) (Oral)  Wt 227 lb 12.8 oz (103.329 kg)  SpO2 97% GEN: mildly ill appearing and audible head/chest congestion -wife at side HENT: NCAT, no sinus tenderness bilaterally, nares with erythema but no discharge, oropharynx mild erythema, no exudate Eyes: Vision grossly intact, no conjunctivitis Lungs: decreased BS R>L base- no rhonchi, no wheeze, no increased work of breathing Cardiovascular: Regular rate and rhythm, no bilateral edema  Lab Results  Component Value Date   WBC 5.9 08/04/2011   HGB 15.1 08/04/2011   HCT 44.2 08/04/2011   PLT 94* 08/04/2011   GLUCOSE 91 09/19/2011   CHOL 151 12/04/2011   TRIG 214.0* 12/04/2011   HDL 44.80 12/04/2011   LDLDIRECT 79.0  12/04/2011   LDLCALC 66 09/13/2010   ALT 24 09/19/2011   AST 18 09/19/2011   NA 142 09/19/2011   K 4.1 09/19/2011   CL 103 09/19/2011   CREATININE 0.9 09/19/2011   BUN 15 09/19/2011   CO2 28 09/19/2011   TSH 1.55 09/19/2011   PSA 0.21 09/13/2010   INR 1.04 12/05/2010   HGBA1C 6.0 09/19/2011       Assessment & Plan:  Viral URI > ongoing antibiotics treatment for last 5 days for sinuitis (see prior OV with Dr Laury Axon) Now with new cough: ? postnasal drip related to above vs pulm process (hx pneumonia noted)   Exam benign, no fever and O2 ok Check CXR and add antitussive Continue amox antibiotics as prescribed for sinus due to comorbid disease OTC cough suppression - new prescriptions done Symptomatic care with Tylenol or Advil, hydration and rest -  salt gargle advised as needed

## 2012-02-01 NOTE — Patient Instructions (Signed)
It was good to see you today. Test(s) ordered today. Your results will be released to MyChart (or called to you) after review, usually within 72hours after test completion. If any changes need to be made, you will be notified at that same time. Medications reviewed: Add probiotic like Vear Clock colon health or Align while on antibiotics - no other recommended changes at this time.

## 2012-02-02 ENCOUNTER — Encounter (HOSPITAL_COMMUNITY): Payer: Medicare Other

## 2012-02-05 ENCOUNTER — Encounter (HOSPITAL_COMMUNITY): Payer: Medicare Other

## 2012-02-07 ENCOUNTER — Encounter (HOSPITAL_COMMUNITY): Payer: Medicare Other

## 2012-02-09 ENCOUNTER — Encounter (HOSPITAL_COMMUNITY): Payer: Medicare Other

## 2012-02-12 ENCOUNTER — Encounter (HOSPITAL_COMMUNITY): Payer: Medicare Other

## 2012-02-14 ENCOUNTER — Encounter (HOSPITAL_COMMUNITY): Payer: Medicare Other

## 2012-02-16 ENCOUNTER — Encounter (HOSPITAL_COMMUNITY): Payer: Medicare Other

## 2012-02-19 ENCOUNTER — Encounter (HOSPITAL_COMMUNITY): Payer: Medicare Other

## 2012-02-21 ENCOUNTER — Encounter (HOSPITAL_COMMUNITY): Payer: Medicare Other

## 2012-02-23 ENCOUNTER — Encounter (HOSPITAL_COMMUNITY): Payer: Medicare Other

## 2012-02-26 ENCOUNTER — Encounter (HOSPITAL_COMMUNITY): Payer: Medicare Other

## 2012-02-26 IMAGING — CR DG CHEST 1V PORT
1 series · 1 of 1 positions shown · non-contrast
Comparison: 01/29/2010

CLINICAL DATA: Respiratory distress.

PORTABLE CHEST - 1 VIEW

[AP]
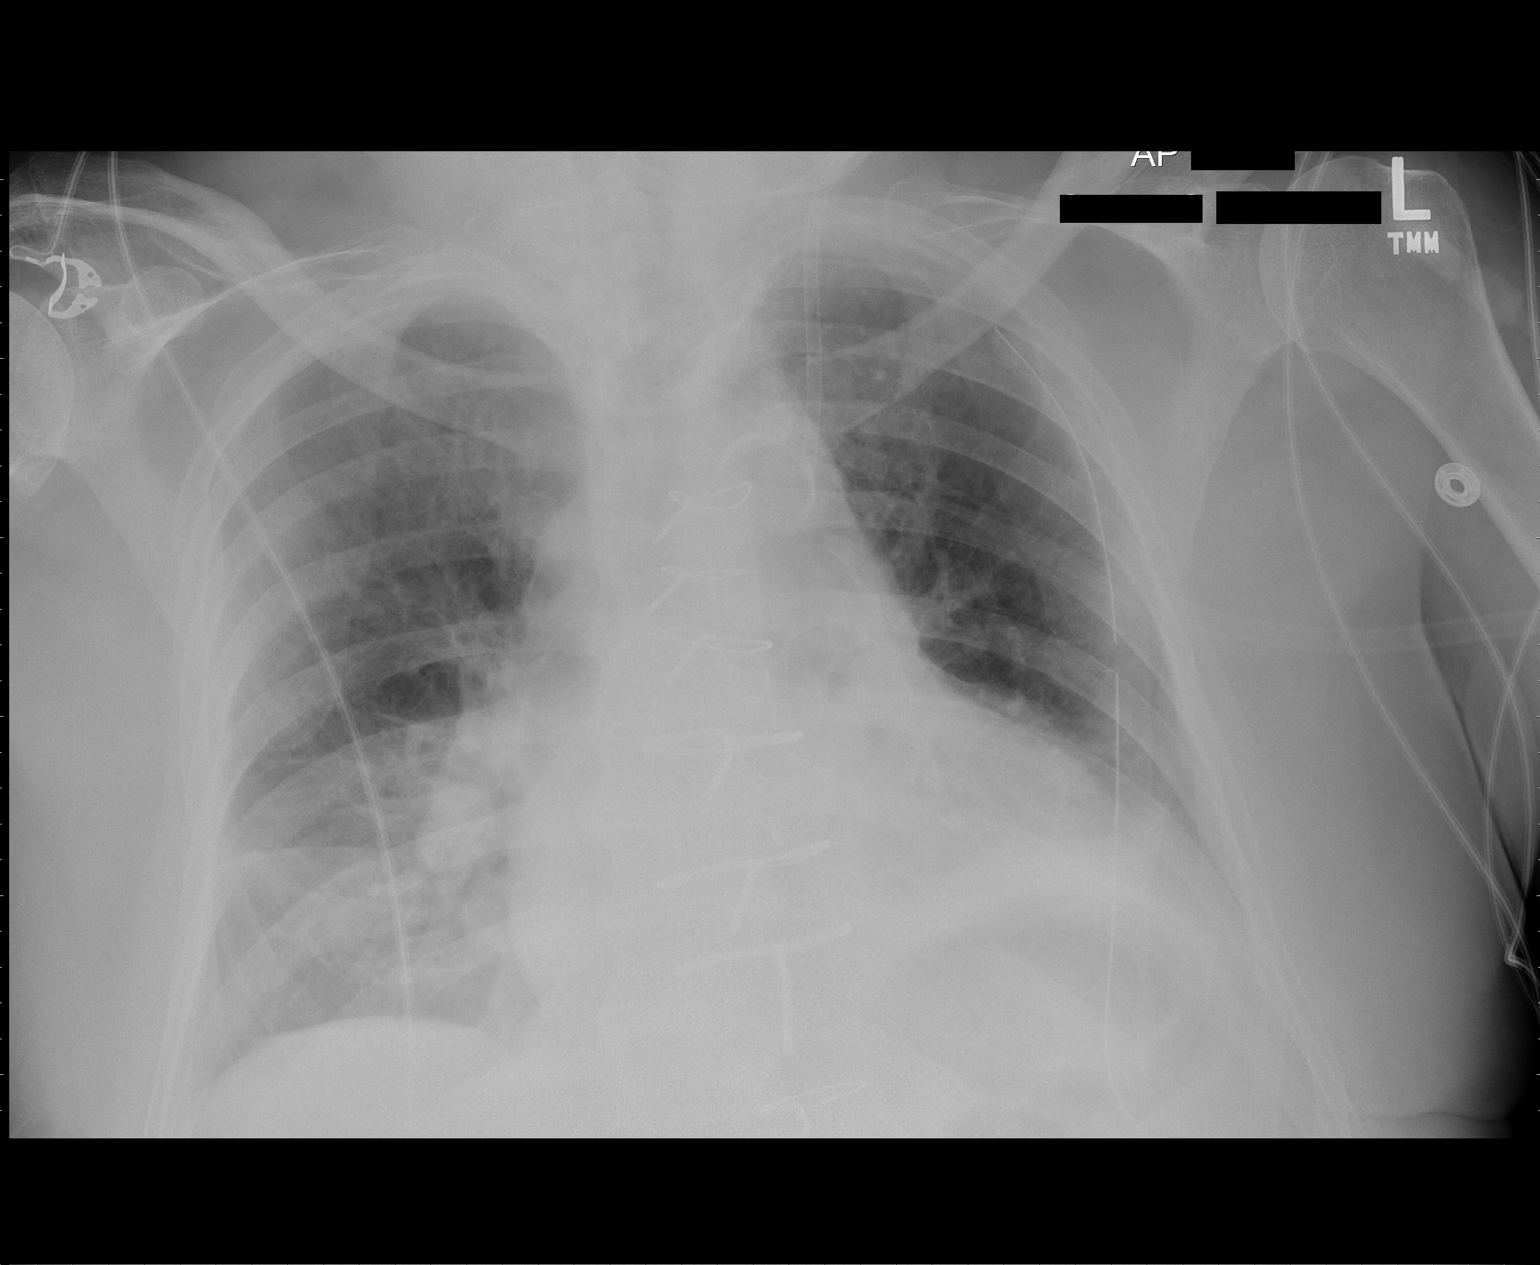

[1 of 1 positions shown; findings below may reference images not displayed]

FINDINGS: Interval removal of Swan-Ganz catheter.  Left chest tube
remains in place.  No pneumothorax.  There is cardiomegaly with
vascular congestion and bibasilar atelectasis.
IMPRESSION: Left chest tube remains in place without pneumothorax.

Cardiomegaly, vascular congestion.

Bibasilar atelectasis.

## 2012-02-28 ENCOUNTER — Encounter (HOSPITAL_COMMUNITY): Payer: Medicare Other

## 2012-03-01 ENCOUNTER — Encounter (HOSPITAL_COMMUNITY): Payer: Medicare Other

## 2012-03-04 ENCOUNTER — Encounter (HOSPITAL_COMMUNITY): Payer: Medicare Other

## 2012-03-06 ENCOUNTER — Encounter (HOSPITAL_COMMUNITY): Payer: Medicare Other

## 2012-03-08 ENCOUNTER — Encounter (HOSPITAL_COMMUNITY): Payer: Medicare Other

## 2012-03-11 ENCOUNTER — Encounter (HOSPITAL_COMMUNITY): Payer: Medicare Other

## 2012-03-13 ENCOUNTER — Encounter (HOSPITAL_COMMUNITY): Payer: Medicare Other

## 2012-03-15 ENCOUNTER — Encounter (HOSPITAL_COMMUNITY): Payer: Medicare Other

## 2012-03-18 ENCOUNTER — Encounter (HOSPITAL_COMMUNITY): Payer: Medicare Other

## 2012-03-20 ENCOUNTER — Encounter (HOSPITAL_COMMUNITY): Payer: Medicare Other

## 2012-03-21 ENCOUNTER — Other Ambulatory Visit (INDEPENDENT_AMBULATORY_CARE_PROVIDER_SITE_OTHER): Payer: Medicare Other

## 2012-03-21 ENCOUNTER — Ambulatory Visit (INDEPENDENT_AMBULATORY_CARE_PROVIDER_SITE_OTHER): Payer: Medicare Other | Admitting: Internal Medicine

## 2012-03-21 ENCOUNTER — Encounter: Payer: Self-pay | Admitting: Internal Medicine

## 2012-03-21 VITALS — BP 120/58 | HR 71 | Temp 98.2°F | Wt 233.0 lb

## 2012-03-21 DIAGNOSIS — E119 Type 2 diabetes mellitus without complications: Secondary | ICD-10-CM

## 2012-03-21 DIAGNOSIS — I10 Essential (primary) hypertension: Secondary | ICD-10-CM

## 2012-03-21 DIAGNOSIS — M48061 Spinal stenosis, lumbar region without neurogenic claudication: Secondary | ICD-10-CM

## 2012-03-21 DIAGNOSIS — E785 Hyperlipidemia, unspecified: Secondary | ICD-10-CM

## 2012-03-21 NOTE — Assessment & Plan Note (Signed)
The current medical regimen is effective;  continue present plan and medications.  BP Readings from Last 3 Encounters:  03/21/12 120/58  02/01/12 120/60  01/27/12 138/74

## 2012-03-21 NOTE — Assessment & Plan Note (Signed)
rx'd statin due to hx CAD Briefly stopped taking crestor 06/2011 - 09/2011 due to leg pain (unchanged with or without med) recheck lipids 11/13 at goal The current medical regimen is effective;  continue present plan and medications.

## 2012-03-21 NOTE — Patient Instructions (Signed)
It was good to see you today. We have reviewed your prior records including labs and tests today Test(s) ordered today. Your results will be released to MyChart (or called to you) after review, usually within 72hours after test completion. If any changes need to be made, you will be notified at that same time. Medications reviewed, no changes at this time. Please schedule followup in 6 months, call sooner if problems. Work on lifestyle changes as discussed (low fat, low carb, increased protein diet; improved exercise efforts; weight loss) to control sugar, blood pressure and cholesterol levels and/or reduce risk of developing other medical problems. Look into [email or web address] or other type of food journal to assist you in this process.

## 2012-03-21 NOTE — Assessment & Plan Note (Signed)
MRI L spine 10/2009: severe spinal stenosis L4-5, multilevel DDD symptomatic lumbar stenosis with BLE weakness s/p spine specialist eval (Saullo) with [facility] x 2 (12/2011 ad 01/2012) - improved follow up as planned and continue PT efforts at home

## 2012-03-21 NOTE — Progress Notes (Signed)
  Subjective:    Patient ID: [name], male    DOB: 1927-11-20, 79 y.o.   MRN: [id number]  HPI  Here for follow up - reviewed chronic med issues:  Past Medical History  Diagnosis Date  . Adenomatous colon polyp 03/1993  . Prostate cancer   . Depression   . OSA (obstructive sleep apnea)   . Hearing loss   . Atrial fibrillation     not anticoag candidate due to decond and falls  . Legally blind 1987  . AORTIC STENOSIS     s/p AVR 01/2010  . CLOSTRIDIUM DIFFICILE COLITIS 12/30/2009  . CORONARY ARTERY DISEASE     s/p CABG 01/28/10  . HIP REPLACEMENT, RIGHT, HX OF 2006  . HYPERTROPHY PROSTATE W/UR OBST & OTH LUTS   . MRSA infection 04/2010  . Gout   . THROMBOCYTOPENIA   . Arthritis   . DEGENERATIVE DISC DISEASE   . Diabetes mellitus, type 2   . GERD   . Hyperlipidemia   . Hypertension     Review of Systems  Constitutional: Positive for fatigue. Negative for fever and unexpected weight change.  Respiratory: Negative for cough and shortness of breath.   Cardiovascular: Negative for chest pain and leg swelling.  Neurological: Negative for headaches.      Objective:   Physical Exam  BP 120/58  Pulse 71  Temp(Src) 98.2 F (36.8 C) (Oral)  Wt 233 lb (105.688 kg)  BMI 33.43 kg/m2  SpO2 97% Wt Readings from Last 3 Encounters:  03/21/12 233 lb (105.688 kg)  02/01/12 227 lb 12.8 oz (103.329 kg)  12/04/11 230 lb (104.327 kg)   Constitutional:  he is overweight, appears well-developed and well-nourished. No distress. Wife at side Neck: Normal range of motion. Neck supple. No JVD present. No thyromegaly present.  Cardiovascular: Normal rate, regular rhythm and normal heart sounds.  No murmur heard. no edema BLE Pulmonary/Chest: Effort normal and breath sounds normal. No respiratory distress. no wheezes.  Neurological: he is alert and oriented to person, place, and time. No cranial nerve deficit. Coordination slow but intact, cautious high stepping gait with mild quad  weakness (bend forward to stand from chair/sitting position).  Psychiatric: he has a reserved affect but bright mood and affect. behavior is normal. Judgment and thought content normal.   Lab Results  Component Value Date   WBC 5.9 08/04/2011   HGB 15.1 08/04/2011   HCT 44.2 08/04/2011   PLT 94* 08/04/2011   CHOL 151 12/04/2011   TRIG 214.0* 12/04/2011   HDL 44.80 12/04/2011   LDLDIRECT 79.0 12/04/2011   ALT 24 09/19/2011   AST 18 09/19/2011   NA 142 09/19/2011   K 4.1 09/19/2011   CL 103 09/19/2011   CREATININE 0.9 09/19/2011   BUN 15 09/19/2011   CO2 28 09/19/2011   TSH 1.55 09/19/2011   PSA 0.21 09/13/2010   INR 1.04 12/05/2010   HGBA1C 6.0 09/19/2011        Assessment & Plan:   see problem list. Medications and labs reviewed today.

## 2012-03-21 NOTE — Assessment & Plan Note (Signed)
Diet controlled Check a1c now and advised attn to weight/diet as ongoing Lab Results  Component Value Date   HGBA1C 6.0 09/19/2011

## 2012-03-22 ENCOUNTER — Encounter (HOSPITAL_COMMUNITY): Payer: Medicare Other

## 2012-03-25 ENCOUNTER — Encounter (HOSPITAL_COMMUNITY): Payer: Medicare Other

## 2012-03-27 ENCOUNTER — Telehealth: Payer: Self-pay | Admitting: *Deleted

## 2012-03-27 ENCOUNTER — Encounter (HOSPITAL_COMMUNITY): Payer: Medicare Other

## 2012-03-27 DIAGNOSIS — H9190 Unspecified hearing loss, unspecified ear: Secondary | ICD-10-CM

## 2012-03-27 NOTE — Telephone Encounter (Signed)
Left msg on vm stating need referral sent to [facility] for hearing test..../lmb

## 2012-03-27 NOTE — Telephone Encounter (Signed)
ok 

## 2012-03-27 NOTE — Telephone Encounter (Signed)
Notified pt wife md ok referral...lmb

## 2012-03-29 ENCOUNTER — Encounter (HOSPITAL_COMMUNITY): Payer: Medicare Other

## 2012-04-01 ENCOUNTER — Encounter (HOSPITAL_COMMUNITY): Payer: Medicare Other

## 2012-04-03 ENCOUNTER — Encounter (HOSPITAL_COMMUNITY): Payer: Medicare Other

## 2012-04-05 ENCOUNTER — Encounter (HOSPITAL_COMMUNITY): Payer: Medicare Other

## 2012-04-08 ENCOUNTER — Encounter (HOSPITAL_COMMUNITY): Payer: Medicare Other

## 2012-04-10 ENCOUNTER — Encounter (HOSPITAL_COMMUNITY): Payer: Medicare Other

## 2012-04-12 ENCOUNTER — Encounter (HOSPITAL_COMMUNITY): Payer: Medicare Other

## 2012-04-15 ENCOUNTER — Encounter (HOSPITAL_COMMUNITY): Payer: Medicare Other

## 2012-04-17 ENCOUNTER — Encounter (HOSPITAL_COMMUNITY): Payer: Medicare Other

## 2012-04-19 ENCOUNTER — Encounter (HOSPITAL_COMMUNITY): Payer: Medicare Other

## 2012-04-22 ENCOUNTER — Encounter (HOSPITAL_COMMUNITY): Payer: Medicare Other

## 2012-04-24 ENCOUNTER — Encounter (HOSPITAL_COMMUNITY): Payer: Medicare Other

## 2012-04-26 ENCOUNTER — Encounter (HOSPITAL_COMMUNITY): Payer: Medicare Other

## 2012-04-29 ENCOUNTER — Encounter (HOSPITAL_COMMUNITY): Payer: Medicare Other

## 2012-05-01 ENCOUNTER — Encounter (HOSPITAL_COMMUNITY): Payer: Medicare Other

## 2012-05-03 ENCOUNTER — Encounter (HOSPITAL_COMMUNITY): Payer: Medicare Other

## 2012-05-06 ENCOUNTER — Encounter (HOSPITAL_COMMUNITY): Payer: Medicare Other

## 2012-05-08 ENCOUNTER — Encounter (HOSPITAL_COMMUNITY): Payer: Medicare Other

## 2012-05-10 ENCOUNTER — Encounter (HOSPITAL_COMMUNITY): Payer: Medicare Other

## 2012-05-13 ENCOUNTER — Encounter (HOSPITAL_COMMUNITY): Payer: Medicare Other

## 2012-05-15 ENCOUNTER — Encounter (HOSPITAL_COMMUNITY): Payer: Medicare Other

## 2012-05-17 ENCOUNTER — Encounter (HOSPITAL_COMMUNITY): Payer: Medicare Other

## 2012-05-20 ENCOUNTER — Encounter (HOSPITAL_COMMUNITY): Payer: Medicare Other

## 2012-05-21 ENCOUNTER — Other Ambulatory Visit (HOSPITAL_COMMUNITY): Payer: Self-pay | Admitting: Cardiovascular Disease

## 2012-05-21 DIAGNOSIS — Z952 Presence of prosthetic heart valve: Secondary | ICD-10-CM

## 2012-05-22 ENCOUNTER — Encounter (HOSPITAL_COMMUNITY): Payer: Medicare Other

## 2012-05-24 ENCOUNTER — Encounter (HOSPITAL_COMMUNITY): Payer: Medicare Other

## 2012-05-25 ENCOUNTER — Emergency Department (HOSPITAL_COMMUNITY): Payer: Medicare Other

## 2012-05-25 ENCOUNTER — Emergency Department (HOSPITAL_COMMUNITY)
Admission: EM | Admit: 2012-05-25 | Discharge: 2012-05-25 | Disposition: A | Discharge status: Home / Self Care | Payer: Medicare Other | Attending: Emergency Medicine | Admitting: Emergency Medicine

## 2012-05-25 ENCOUNTER — Encounter (HOSPITAL_COMMUNITY): Payer: Self-pay | Admitting: *Deleted

## 2012-05-25 DIAGNOSIS — I10 Essential (primary) hypertension: Secondary | ICD-10-CM | POA: Insufficient documentation

## 2012-05-25 DIAGNOSIS — I2510 Atherosclerotic heart disease of native coronary artery without angina pectoris: Secondary | ICD-10-CM | POA: Insufficient documentation

## 2012-05-25 DIAGNOSIS — M109 Gout, unspecified: Secondary | ICD-10-CM | POA: Insufficient documentation

## 2012-05-25 DIAGNOSIS — K219 Gastro-esophageal reflux disease without esophagitis: Secondary | ICD-10-CM | POA: Insufficient documentation

## 2012-05-25 DIAGNOSIS — R110 Nausea: Secondary | ICD-10-CM | POA: Insufficient documentation

## 2012-05-25 DIAGNOSIS — R5381 Other malaise: Secondary | ICD-10-CM | POA: Insufficient documentation

## 2012-05-25 DIAGNOSIS — G4733 Obstructive sleep apnea (adult) (pediatric): Secondary | ICD-10-CM | POA: Insufficient documentation

## 2012-05-25 DIAGNOSIS — R531 Weakness: Secondary | ICD-10-CM

## 2012-05-25 DIAGNOSIS — Z8739 Personal history of other diseases of the musculoskeletal system and connective tissue: Secondary | ICD-10-CM | POA: Insufficient documentation

## 2012-05-25 DIAGNOSIS — H9190 Unspecified hearing loss, unspecified ear: Secondary | ICD-10-CM | POA: Insufficient documentation

## 2012-05-25 DIAGNOSIS — Z8679 Personal history of other diseases of the circulatory system: Secondary | ICD-10-CM | POA: Insufficient documentation

## 2012-05-25 DIAGNOSIS — R011 Cardiac murmur, unspecified: Secondary | ICD-10-CM | POA: Insufficient documentation

## 2012-05-25 DIAGNOSIS — M129 Arthropathy, unspecified: Secondary | ICD-10-CM | POA: Insufficient documentation

## 2012-05-25 DIAGNOSIS — Z87891 Personal history of nicotine dependence: Secondary | ICD-10-CM | POA: Insufficient documentation

## 2012-05-25 DIAGNOSIS — Z8719 Personal history of other diseases of the digestive system: Secondary | ICD-10-CM | POA: Insufficient documentation

## 2012-05-25 DIAGNOSIS — E785 Hyperlipidemia, unspecified: Secondary | ICD-10-CM | POA: Insufficient documentation

## 2012-05-25 DIAGNOSIS — E119 Type 2 diabetes mellitus without complications: Secondary | ICD-10-CM | POA: Insufficient documentation

## 2012-05-25 DIAGNOSIS — Z8546 Personal history of malignant neoplasm of prostate: Secondary | ICD-10-CM | POA: Insufficient documentation

## 2012-05-25 DIAGNOSIS — Z860100 Personal history of colon polyps, unspecified: Secondary | ICD-10-CM | POA: Insufficient documentation

## 2012-05-25 DIAGNOSIS — Z79899 Other long term (current) drug therapy: Secondary | ICD-10-CM | POA: Insufficient documentation

## 2012-05-25 DIAGNOSIS — Z951 Presence of aortocoronary bypass graft: Secondary | ICD-10-CM | POA: Insufficient documentation

## 2012-05-25 DIAGNOSIS — R109 Unspecified abdominal pain: Secondary | ICD-10-CM | POA: Insufficient documentation

## 2012-05-25 DIAGNOSIS — R509 Fever, unspecified: Secondary | ICD-10-CM | POA: Insufficient documentation

## 2012-05-25 DIAGNOSIS — Z7982 Long term (current) use of aspirin: Secondary | ICD-10-CM | POA: Insufficient documentation

## 2012-05-25 DIAGNOSIS — Z8614 Personal history of Methicillin resistant Staphylococcus aureus infection: Secondary | ICD-10-CM | POA: Insufficient documentation

## 2012-05-25 DIAGNOSIS — Z862 Personal history of diseases of the blood and blood-forming organs and certain disorders involving the immune mechanism: Secondary | ICD-10-CM | POA: Insufficient documentation

## 2012-05-25 DIAGNOSIS — Z87448 Personal history of other diseases of urinary system: Secondary | ICD-10-CM | POA: Insufficient documentation

## 2012-05-25 HISTORY — DX: Personal history of Methicillin resistant Staphylococcus aureus infection: Z86.14

## 2012-05-25 LAB — CBC WITH DIFFERENTIAL/PLATELET
Basophils Absolute: 0 10*3/uL (ref 0.0–0.1)
Basophils Relative: 0 % (ref 0–1)
Eosinophils Absolute: 0 10*3/uL (ref 0.0–0.7)
Eosinophils Relative: 1 % (ref 0–5)
HCT: 43.4 % (ref 39.0–52.0)
Lymphocytes Relative: 33 % (ref 12–46)
MCH: 31.6 pg (ref 26.0–34.0)
MCHC: 36.6 g/dL — ABNORMAL HIGH (ref 30.0–36.0)
MCV: 86.3 fL (ref 78.0–100.0)
Monocytes Absolute: 1.1 10*3/uL — ABNORMAL HIGH (ref 0.1–1.0)
Platelets: 78 10*3/uL — ABNORMAL LOW (ref 150–400)
RDW: 12.7 % (ref 11.5–15.5)

## 2012-05-25 LAB — URINALYSIS, ROUTINE W REFLEX MICROSCOPIC
Bilirubin Urine: NEGATIVE
Ketones, ur: NEGATIVE mg/dL
Nitrite: NEGATIVE
Protein, ur: NEGATIVE mg/dL
pH: 6 (ref 5.0–8.0)

## 2012-05-25 LAB — POCT I-STAT, CHEM 8
Calcium, Ion: 1.23 mmol/L (ref 1.13–1.30)
Chloride: 103 mEq/L (ref 96–112)
Glucose, Bld: 104 mg/dL — ABNORMAL HIGH (ref 70–99)
HCT: 46 % (ref 39.0–52.0)
TCO2: 29 mmol/L (ref 0–100)

## 2012-05-25 NOTE — ED Notes (Signed)
Here for onset of difficulty breathing about midnight. No cough or fever. Having some head congestion and nasal drainage. Denies chest pain or pressure. Denies any difficulty breathing. States he was not feeling well on yesterday but no difficulty breathing yesterday.

## 2012-05-25 NOTE — ED Provider Notes (Signed)
History     CSN: [id number]  Arrival date & time 05/25/12  1125   First MD Initiated Contact with Patient 05/25/12 1137      Chief Complaint  Patient presents with  . Shortness of Breath    (Consider location/radiation/quality/duration/timing/severity/associated sxs/prior treatment) The history is provided by the patient.   patient came in because he had an episode of shortness of breath. He states he had his belly feels tight this morning and then he began to feel some shortness of breath. Fevers. No cough. He had mild nausea but no vomiting. No diarrhea. No headache. No confusion. He states he feels better now. His wife states he slept a lot yesterday. He feels somewhat better now.  Past Medical History  Diagnosis Date  . Adenomatous colon polyp 03/1993  . Prostate cancer   . Depression   . OSA (obstructive sleep apnea)   . Hearing loss   . Atrial fibrillation     not anticoag candidate due to decond and falls  . Legally blind 1987  . AORTIC STENOSIS     s/p AVR 01/2010  . CLOSTRIDIUM DIFFICILE COLITIS 12/30/2009  . CORONARY ARTERY DISEASE     s/p CABG 01/28/10  . HIP REPLACEMENT, RIGHT, HX OF 2006  . HYPERTROPHY PROSTATE W/UR OBST & OTH LUTS   . MRSA infection 04/2010  . Gout   . THROMBOCYTOPENIA   . Arthritis   . DEGENERATIVE DISC DISEASE   . Diabetes mellitus, type 2   . GERD   . Hyperlipidemia   . Hypertension   . MRSA infection greater than 3 months ago     Past Surgical History  Procedure Laterality Date  . Coronary artery bypass graft  01/28/2010    x3  . Aortic valve replacement  01/28/2010  . Prostatectomy  1993  . Hernia repair  03/15/2009    left  . Anal sphincterotomy  1996  . Total hip arthroplasty  2006    left  . Cataract extraction, bilateral      4/11 & 5/11  . Cholecystectomy  2011    Family History  Problem Relation Age of Onset  . Heart disease Mother   . Lung cancer Mother   . Hypertension Brother   . Alcohol abuse Father   . Sudden  death Father   . Aneurysm Father   . Heart attack Brother   . Gout Brother   . Arthritis Brother   . Arthritis Other   . Gout Other     History  Substance Use Topics  . Smoking status: Former Smoker -- 1.00 packs/day for 2 years    Types: Cigars    Quit date: 07/01/2009  . 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 Alcohol Use: 4.2 oz/week    7 Cans of beer per week      Review of Systems  Constitutional: Positive for fatigue. Negative for activity change and appetite change.  HENT: Negative for neck stiffness.   Eyes: Negative for pain.  Respiratory: Positive for shortness of breath. Negative for chest tightness.   Cardiovascular: Negative for chest pain and leg swelling.  Gastrointestinal: Positive for nausea and abdominal pain. Negative for vomiting and diarrhea.  Genitourinary: Negative for flank pain.  Musculoskeletal: Negative for back pain.  Skin: Negative for rash.  Neurological: Negative for weakness, numbness and headaches.  Psychiatric/Behavioral: Negative for behavioral problems.    Allergies  Doxycycline; Septra; and Morphine  Home Medications   Current Outpatient Rx  Name  Route  Sig  Dispense  Refill  . acetaminophen (TYLENOL) 500 MG tablet   Oral   Take 650 mg by mouth 2 (two) times daily as needed for pain.          Marland Kitchen allopurinol (ZYLOPRIM) 300 MG tablet   Oral   Take 1 tablet (300 mg total) by mouth at bedtime.   90 tablet   2   . Ascorbic Acid (VITAMIN C PO)   Oral   Take 1 tablet by mouth every morning.           Marland Kitchen aspirin 325 MG tablet   Oral   Take 325 mg by mouth every morning.          Marland Kitchen CALCIUM PO   Oral   Take 1 tablet by mouth every morning.           . cetirizine (ZYRTEC ALLERGY) 10 MG tablet   Oral   Take 10 mg by mouth daily.         . Cholecalciferol (VITAMIN D-3 PO)   Oral   Take 2,000 Units by mouth daily.           Marland Kitchen diltiazem (CARDIZEM CD)  180 MG 24 hr capsule   Oral   Take 180 mg by mouth every evening.         . docusate sodium (COLACE) 100 MG capsule   Oral   Take 100 mg by mouth daily.           . DULoxetine (CYMBALTA) 20 MG capsule   Oral   Take 1 capsule (20 mg total) by mouth daily.   90 capsule   3   . furosemide (LASIX) 40 MG tablet   Oral   Take 0.5 tablets (20 mg total) by mouth daily.   45 tablet   3   . metoprolol tartrate (LOPRESSOR) 25 MG tablet   Oral   Take 1 tablet (25 mg total) by mouth 2 (two) times daily.   180 tablet   3   . Multiple Vitamins-Minerals (ICAPS MV PO)   Oral   Take 1 tablet by mouth every morning.           Marland Kitchen omeprazole (PRILOSEC) 20 MG capsule   Oral   Take 1 capsule (20 mg total) by mouth daily after breakfast.   90 capsule   3   . potassium chloride (K-DUR) 10 MEQ tablet   Oral   Take 1 tablet (10 mEq total) by mouth daily.   90 tablet   3   . Pyridoxine HCl (VITAMIN B-6 PO)   Oral   Take 1 tablet by mouth every morning.           . ranitidine (ZANTAC) 150 MG tablet   Oral   Take 0.5 tablets (75 mg total) by mouth every morning.   45 tablet   3   . rOPINIRole (REQUIP) 0.25 MG tablet   Oral   Take 3 tablets (0.75 mg total) by mouth at bedtime.   270 tablet   2   . rosuvastatin (CRESTOR) 10 MG tablet   Oral   Take 10 mg by mouth at bedtime.         . Thiamine HCl (VITAMIN B-1 PO)   Oral   Take 1 tablet by mouth every morning.             BP 159/71  Pulse 72  Temp(Src) 97.3 F (36.3 C) (Oral)  Resp 19  SpO2 98%  Physical Exam  Nursing note and vitals reviewed. Constitutional: He is oriented to person, place, and time. He appears well-developed and well-nourished.  HENT:  Head: Normocephalic and atraumatic.  Eyes: EOM are normal. Pupils are equal, round, and reactive to light.  Neck: Normal range of motion. Neck supple.  Cardiovascular: Normal rate and regular rhythm.   Murmur heard. Pulmonary/Chest: Effort normal and  breath sounds normal.  Abdominal: Soft. Bowel sounds are normal. He exhibits no distension and no mass. There is no tenderness. There is no rebound and no guarding.  Musculoskeletal: Normal range of motion. He exhibits no edema.  Neurological: He is alert and oriented to person, place, and time. No cranial nerve deficit.  Skin: Skin is warm and dry.  Psychiatric: He has a normal mood and affect.    ED Course  Procedures (including critical care time)  Labs Reviewed  CBC WITH DIFFERENTIAL - Abnormal; Notable for the following:    MCHC 36.6 (*)    Platelets 78 (*)    Monocytes Relative 24 (*)    Monocytes Absolute 1.1 (*)    All other components within normal limits  POCT I-STAT, CHEM 8 - Abnormal; Notable for the following:    Glucose, Bld 104 (*)    All other components within normal limits  URINALYSIS, ROUTINE W REFLEX MICROSCOPIC  POCT I-STAT TROPONIN I   Dg Abd Acute W/chest  05/25/2012  *RADIOLOGY REPORT*  Clinical Data: Abdominal distention.  Shortness of breath.  ACUTE ABDOMEN SERIES (ABDOMEN 2 VIEW & CHEST 1 VIEW)  Comparison: 02/01/2012  Findings: Frontal chest x-ray shows chronic interstitial coarsening.  There is some chronic atelectasis or linear scarring in the right para hilar region. The cardiopericardial silhouette is within normal limits for size.  No focal airspace disease or pulmonary edema.  Upright film shows no evidence for intraperitoneal free air. Supine abdomen shows no gaseous bowel dilatation to suggest obstruction.  Surgical clips in the right upper quadrant suggest prior cholecystectomy.  Surgical clips are seen along the pelvic sidewalls bilaterally.  The patient had a left hip replacement, incompletely visualized.  Degenerative changes are noted in the right hip.  IMPRESSION: No acute cardiopulmonary findings.  No evidence for bowel obstruction or perforation.   Original Report Authenticated By: Kennith Center, M.D.      1. Weakness      Date: 05/25/2012   Rate: 65  Rhythm: normal sinus rhythm  QRS Axis: normal  Intervals: normal  ST/T Wave abnormalities: normal  Conduction Disutrbances: none  Narrative Interpretation: unremarkable       MDM  Patient presented with abdominal pain and weakness. Wasn't resolved. His been a little fatigued. EKG is reassuring. Lab work is negative. X-ray and urinalysis did not show infection. Patient be discharged home to followup with his primary care doctor as needed.        Juliet Rude. Rubin Payor, MD 05/25/12 [phone]

## 2012-05-27 ENCOUNTER — Encounter (HOSPITAL_COMMUNITY): Payer: Medicare Other

## 2012-05-28 ENCOUNTER — Ambulatory Visit (HOSPITAL_COMMUNITY)
Admission: RE | Admit: 2012-05-28 | Discharge: 2012-05-28 | Disposition: A | Discharge status: Home / Self Care | Payer: Medicare Other | Source: Ambulatory Visit | Attending: Cardiovascular Disease | Admitting: Cardiovascular Disease

## 2012-05-28 DIAGNOSIS — I10 Essential (primary) hypertension: Secondary | ICD-10-CM | POA: Insufficient documentation

## 2012-05-28 DIAGNOSIS — Z954 Presence of other heart-valve replacement: Secondary | ICD-10-CM | POA: Insufficient documentation

## 2012-05-28 DIAGNOSIS — E119 Type 2 diabetes mellitus without complications: Secondary | ICD-10-CM | POA: Insufficient documentation

## 2012-05-28 DIAGNOSIS — Z8249 Family history of ischemic heart disease and other diseases of the circulatory system: Secondary | ICD-10-CM | POA: Insufficient documentation

## 2012-05-28 DIAGNOSIS — I2510 Atherosclerotic heart disease of native coronary artery without angina pectoris: Secondary | ICD-10-CM | POA: Insufficient documentation

## 2012-05-28 DIAGNOSIS — Z952 Presence of prosthetic heart valve: Secondary | ICD-10-CM

## 2012-05-28 NOTE — Progress Notes (Signed)
2D Echo Performed 05/28/2012    Sahithi Ordoyne, RCS  

## 2012-05-29 ENCOUNTER — Encounter (HOSPITAL_COMMUNITY): Payer: Medicare Other

## 2012-05-30 ENCOUNTER — Encounter: Payer: Self-pay | Admitting: Internal Medicine

## 2012-05-30 ENCOUNTER — Ambulatory Visit (INDEPENDENT_AMBULATORY_CARE_PROVIDER_SITE_OTHER): Payer: Medicare Other | Admitting: Internal Medicine

## 2012-05-30 VITALS — BP 132/68 | HR 67 | Temp 97.4°F | Wt 231.8 lb

## 2012-05-30 DIAGNOSIS — H539 Unspecified visual disturbance: Secondary | ICD-10-CM

## 2012-05-30 DIAGNOSIS — E785 Hyperlipidemia, unspecified: Secondary | ICD-10-CM

## 2012-05-30 DIAGNOSIS — I4891 Unspecified atrial fibrillation: Secondary | ICD-10-CM

## 2012-05-30 DIAGNOSIS — E119 Type 2 diabetes mellitus without complications: Secondary | ICD-10-CM

## 2012-05-30 DIAGNOSIS — I10 Essential (primary) hypertension: Secondary | ICD-10-CM

## 2012-05-30 NOTE — Assessment & Plan Note (Signed)
rx'd statin due to hx CAD Briefly stopped taking crestor 06/2011 - 09/2011 due to leg pain (unchanged with or without med) last lipids at goal The current medical regimen is effective;  continue present plan and medications.

## 2012-05-30 NOTE — Progress Notes (Signed)
Subjective:    Patient ID: [name], male    DOB: [birth year], 79 y.o.   MRN: [id number]  HPI  Here for "blurred" vision -  Described by wife as seeing things not present (rocks, bugs) and not seeing things in front of him (dinner plate) Onset 8 days ago S/p eye evaluation Dr Dione Booze 05/29/12 - no occular problem    Past Medical History  Diagnosis Date  . Adenomatous colon polyp 03/1993  . Prostate cancer   . Depression   . OSA (obstructive sleep apnea)   . Hearing loss   . Atrial fibrillation     not anticoag candidate due to decond and falls  . Legally blind 1987  . AORTIC STENOSIS     s/p AVR 01/2010  . CLOSTRIDIUM DIFFICILE COLITIS 12/30/2009  . CORONARY ARTERY DISEASE     s/p CABG 01/28/10  . HIP REPLACEMENT, RIGHT, HX OF 2006  . HYPERTROPHY PROSTATE W/UR OBST & OTH LUTS   . MRSA infection 04/2010  . Gout   . THROMBOCYTOPENIA   . Arthritis   . DEGENERATIVE DISC DISEASE   . Diabetes mellitus, type 2   . GERD   . Hyperlipidemia   . Hypertension   . MRSA infection greater than 3 months ago     Review of Systems  Constitutional: Positive for fatigue. Negative for fever and unexpected weight change.  Eyes: Positive for visual disturbance (see hpi). Negative for photophobia, pain and discharge.  Respiratory: Negative for cough and shortness of breath.   Cardiovascular: Negative for chest pain and leg swelling.  Neurological: Positive for speech difficulty (slow to initiate conversation, no dysarthria), weakness and headaches (last week x 2d, mild-mod, none for past 4 days). Negative for dizziness, seizures, syncope, facial asymmetry and numbness.  Psychiatric/Behavioral: Positive for hallucinations (?) and confusion (?). Negative for sleep disturbance and self-injury. The patient is not nervous/anxious.       Objective:   Physical Exam  BP 132/68  Pulse 67  Temp(Src) 97.4 F (36.3 C) (Oral)  Wt 231 lb 12.8 oz (105.144 kg)  BMI 33.26 kg/m2  SpO2 97% Wt  Readings from Last 3 Encounters:  05/30/12 231 lb 12.8 oz (105.144 kg)  03/21/12 233 lb (105.688 kg)  02/01/12 227 lb 12.8 oz (103.329 kg)   Constitutional:  he is overweight, appears well-developed and well-nourished. No distress. Wife at side Neck: Normal range of motion. Neck supple. No JVD present. No thyromegaly present.  Cardiovascular: Normal rate, regular rhythm and normal heart sounds.  No murmur heard. no edema BLE Pulmonary/Chest: Effort normal and breath sounds normal. No respiratory distress. no wheezes.  Neurological: he is alert and oriented to person, place, and time. No cranial nerve deficit. Coordination slow but intact, cautious high stepping gait with chronic mild quad weakness (bend forward to stand from chair/sitting position).  Psychiatric: he has a reserved affect but bright mood and affect. behavior is normal. Judgment and thought content normal.   Lab Results  Component Value Date   WBC 4.7 05/25/2012   HGB 15.6 05/25/2012   HCT 46.0 05/25/2012   PLT 78* 05/25/2012   CHOL 151 12/04/2011   TRIG 214.0* 12/04/2011   HDL 44.80 12/04/2011   LDLDIRECT 79.0 12/04/2011   ALT 24 09/19/2011   AST 18 09/19/2011   NA 143 05/25/2012   K 4.4 05/25/2012   CL 103 05/25/2012   CREATININE 0.90 05/25/2012   BUN 16 05/25/2012   CO2 28 09/19/2011   TSH 1.55 09/19/2011  PSA 0.21 09/13/2010   INR 1.04 12/05/2010   HGBA1C 5.9 03/21/2012        Assessment & Plan:   Vision changes/confusion/"?hallucinations" - concerning for CVA given history of same and comorbidities  Held ASA x 7 days prior to lumbar ESI 05/27/12 Hx prior CVA reviewed  Check MRI brain Has resumed aspirin 325, continue same daily Review recent 2-D echo 05/28/2012 with Dr. Allyson Sabal cardiology Continue aggressive risk factor stratification and control

## 2012-05-30 NOTE — Assessment & Plan Note (Signed)
Diet controlled Check a1c q29mo and advised attn to weight/diet as ongoing Lab Results  Component Value Date   HGBA1C 5.9 03/21/2012

## 2012-05-30 NOTE — Assessment & Plan Note (Signed)
Hx same perioperative for cardiac surgery 2012 -  none documented since, so no anticoag rx'd

## 2012-05-30 NOTE — Patient Instructions (Signed)
It was good to see you today. We have reviewed your prior records including labs and tests today Test(s) ordered today. Your results will be released to MyChart (or called to you) after review, usually within 72hours after test completion. If any changes need to be made, you will be notified at that same time. Medications reviewed and updated, no changes recommended at this time.

## 2012-05-31 ENCOUNTER — Encounter (HOSPITAL_COMMUNITY): Payer: Medicare Other

## 2012-06-03 ENCOUNTER — Encounter (HOSPITAL_COMMUNITY): Payer: Medicare Other

## 2012-06-05 ENCOUNTER — Encounter (HOSPITAL_COMMUNITY): Payer: Medicare Other

## 2012-06-06 ENCOUNTER — Ambulatory Visit
Admission: RE | Admit: 2012-06-06 | Discharge: 2012-06-06 | Disposition: A | Discharge status: Home / Self Care | Payer: Medicare Other | Source: Ambulatory Visit | Attending: Internal Medicine | Admitting: Internal Medicine

## 2012-06-06 DIAGNOSIS — I4891 Unspecified atrial fibrillation: Secondary | ICD-10-CM

## 2012-06-06 DIAGNOSIS — H539 Unspecified visual disturbance: Secondary | ICD-10-CM

## 2012-06-06 DIAGNOSIS — E785 Hyperlipidemia, unspecified: Secondary | ICD-10-CM

## 2012-06-06 DIAGNOSIS — I10 Essential (primary) hypertension: Secondary | ICD-10-CM

## 2012-06-06 DIAGNOSIS — E119 Type 2 diabetes mellitus without complications: Secondary | ICD-10-CM

## 2012-06-07 ENCOUNTER — Encounter (HOSPITAL_COMMUNITY): Payer: Medicare Other

## 2012-06-10 ENCOUNTER — Encounter (HOSPITAL_COMMUNITY): Payer: Medicare Other

## 2012-06-12 ENCOUNTER — Encounter (HOSPITAL_COMMUNITY): Payer: Medicare Other

## 2012-06-14 ENCOUNTER — Encounter (HOSPITAL_COMMUNITY): Payer: Medicare Other

## 2012-06-19 ENCOUNTER — Encounter (HOSPITAL_COMMUNITY): Payer: Medicare Other

## 2012-06-20 ENCOUNTER — Other Ambulatory Visit: Payer: Self-pay | Admitting: Dermatology

## 2012-06-21 ENCOUNTER — Encounter (HOSPITAL_COMMUNITY): Payer: Medicare Other

## 2012-06-24 ENCOUNTER — Encounter (HOSPITAL_COMMUNITY): Payer: Medicare Other

## 2012-06-26 ENCOUNTER — Encounter (HOSPITAL_COMMUNITY): Payer: Medicare Other

## 2012-06-28 ENCOUNTER — Encounter (HOSPITAL_COMMUNITY): Payer: Medicare Other

## 2012-07-01 ENCOUNTER — Encounter (HOSPITAL_COMMUNITY): Payer: Medicare Other

## 2012-07-02 ENCOUNTER — Other Ambulatory Visit: Payer: Self-pay | Admitting: Internal Medicine

## 2012-07-03 ENCOUNTER — Encounter (HOSPITAL_COMMUNITY): Payer: Medicare Other

## 2012-07-05 ENCOUNTER — Encounter (HOSPITAL_COMMUNITY): Payer: Medicare Other

## 2012-07-08 ENCOUNTER — Encounter (HOSPITAL_COMMUNITY): Payer: Medicare Other

## 2012-07-09 ENCOUNTER — Ambulatory Visit (INDEPENDENT_AMBULATORY_CARE_PROVIDER_SITE_OTHER): Payer: Medicare Other | Admitting: Internal Medicine

## 2012-07-09 ENCOUNTER — Encounter: Payer: Self-pay | Admitting: Internal Medicine

## 2012-07-09 VITALS — BP 144/72 | HR 66 | Temp 97.4°F | Ht 70.0 in | Wt 226.0 lb

## 2012-07-09 DIAGNOSIS — J019 Acute sinusitis, unspecified: Secondary | ICD-10-CM

## 2012-07-09 MED ORDER — CEFDINIR 300 MG PO CAPS: 300.0000 mg | ORAL_CAPSULE | Freq: Two times a day (BID) | ORAL | Status: DC

## 2012-07-09 NOTE — Patient Instructions (Signed)

## 2012-07-09 NOTE — Progress Notes (Signed)
HPI  Pt presents to the clinic today with c/o sinus issues.He also c/o a sore throat. He denies fever, chills or body aches. He is supposed to take Flonase on a daily basis but often times forgets. This has been an ongoing chronic issues since 2011. He tries not to get antibiotics frequently because a history of C diff. He has not been treated with antibiotics in the last 4- 6 weeks. He did have a MRI brain 5 weeks ago which did show moderate thickening of the sphenoid sinus. He has no history of allergies, asthma, COPD or lung issues.   Review of Systems    Past Medical History  Diagnosis Date  . Adenomatous colon polyp 03/1993  . Prostate cancer   . Depression   . OSA (obstructive sleep apnea)   . Hearing loss   . Atrial fibrillation     not anticoag candidate due to decond and falls  . Legally blind 1987  . AORTIC STENOSIS     s/p AVR 01/2010  . CLOSTRIDIUM DIFFICILE COLITIS 12/30/2009  . CORONARY ARTERY DISEASE     s/p CABG 01/28/10  . HIP REPLACEMENT, RIGHT, HX OF 2006  . HYPERTROPHY PROSTATE W/UR OBST & OTH LUTS   . MRSA infection 04/2010  . Gout   . THROMBOCYTOPENIA   . Arthritis   . DEGENERATIVE DISC DISEASE   . Diabetes mellitus, type 2   . GERD   . Hyperlipidemia   . Hypertension   . MRSA infection greater than 3 months ago     Family History  Problem Relation Age of Onset  . Heart disease Mother   . Lung cancer Mother   . Hypertension Brother   . Alcohol abuse Father   . Sudden death Father   . Aneurysm Father   . Heart attack Brother   . Gout Brother   . Arthritis Brother   . Arthritis Other   . Gout Other     History   Social History  . Marital Status: Married    Spouse Name: N/A    Number of Children: 2  . Years of Education: N/A   Occupational History  . Retired     Environmental manager   Social History Main Topics  . Smoking status: Former Smoker -- 1.00 packs/day for 2 years    Types: Cigars    Quit date: 07/01/2009  . 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 Alcohol Use: 4.2 oz/week    7 Cans of beer per week  . Drug Use: No  . Sexually Active: Not on file   Other Topics Concern  . Not on file   Social History Narrative  . No narrative on file    Allergies  Allergen Reactions  . Doxycycline     rash  . Septra (Bactrim)     hallucinations  . Morphine Anxiety    REACTION: itching     Constitutional:  Denies headache, fatigue, fever or abrupt weight changes.  HEENT:  Positive eye pain, pressure behind the eyes, facial pain, nasal congestion and sore throat. Denies eye redness, ear pain, ringing in the ears, wax buildup, runny nose or bloody nose. Respiratory:  Denies cough, difficulty breathing or shortness of breath.  Cardiovascular: Denies chest pain, chest tightness, palpitations or swelling in the hands or feet.   No other specific complaints in a complete review of systems (except as listed in HPI above).  Objective:  BP 144/72  Pulse 66  Temp(Src) 97.4 F (36.3 C) (Oral)  Ht 5\' 10"  (1.778 m)  Wt 226 lb (102.513 kg)  BMI 32.43 kg/m2  SpO2 97% Wt Readings from Last 3 Encounters:  07/09/12 226 lb (102.513 kg)  05/30/12 231 lb 12.8 oz (105.144 kg)  03/21/12 233 lb (105.688 kg)    General: Appears his stated age, well developed, well nourished in NAD. HEENT: Head: normal shape and size; Eyes: sclera white, no icterus, conjunctiva pink, PERRLA and EOMs intact; Ears: Tm's gray and intact, normal light reflex; Nose: mucosa pink and moist, septum midline; Throat/Mouth: + PND. Teeth present, mucosa pink and moist, no exudate noted, no lesions or ulcerations noted.  Neck: Mild cervical lymphadenopathy. Neck supple, trachea midline. No massses, lumps or thyromegaly present.  Cardiovascular: Normal rate and rhythm. S1,S2 noted. No murmur, rubs or gallops noted. No JVD or BLE edema. No carotid bruits noted. Pulmonary/Chest: Normal effort and positive  vesicular breath sounds. No respiratory distress. No wheezes, rales or ronchi noted.      Assessment & Plan:   Acute bacterial sinusitis  Can use a Neti Pot which can be purchased from your local drug store. Flonase 2 sprays each nostril for 3 days and then as needed. Omnicef BID for 10 days  RTC as needed or if symptoms persist.

## 2012-07-10 ENCOUNTER — Encounter (HOSPITAL_COMMUNITY): Payer: Medicare Other

## 2012-07-12 ENCOUNTER — Encounter (HOSPITAL_COMMUNITY): Payer: Medicare Other

## 2012-07-15 ENCOUNTER — Encounter (HOSPITAL_COMMUNITY): Payer: Medicare Other

## 2012-07-17 ENCOUNTER — Encounter (HOSPITAL_COMMUNITY): Payer: Medicare Other

## 2012-07-19 ENCOUNTER — Encounter (HOSPITAL_COMMUNITY): Payer: Medicare Other

## 2012-07-22 ENCOUNTER — Encounter (HOSPITAL_COMMUNITY): Payer: Medicare Other

## 2012-07-24 ENCOUNTER — Encounter (HOSPITAL_COMMUNITY): Payer: Medicare Other

## 2012-07-29 ENCOUNTER — Encounter (HOSPITAL_COMMUNITY): Payer: Medicare Other

## 2012-07-31 ENCOUNTER — Encounter (HOSPITAL_COMMUNITY): Payer: Medicare Other

## 2012-08-02 ENCOUNTER — Encounter (HOSPITAL_COMMUNITY): Payer: Medicare Other

## 2012-08-05 ENCOUNTER — Encounter (HOSPITAL_COMMUNITY): Payer: Medicare Other

## 2012-08-07 ENCOUNTER — Encounter (HOSPITAL_COMMUNITY): Payer: Medicare Other

## 2012-08-09 ENCOUNTER — Encounter (HOSPITAL_COMMUNITY): Payer: Medicare Other

## 2012-08-12 ENCOUNTER — Encounter (HOSPITAL_COMMUNITY): Payer: Medicare Other

## 2012-08-14 ENCOUNTER — Encounter (HOSPITAL_COMMUNITY): Payer: Medicare Other

## 2012-08-16 ENCOUNTER — Encounter (HOSPITAL_COMMUNITY): Payer: Medicare Other

## 2012-08-19 ENCOUNTER — Encounter (HOSPITAL_COMMUNITY): Payer: Medicare Other

## 2012-08-21 ENCOUNTER — Encounter (HOSPITAL_COMMUNITY): Payer: Medicare Other

## 2012-09-19 ENCOUNTER — Encounter: Payer: Self-pay | Admitting: Internal Medicine

## 2012-09-19 ENCOUNTER — Other Ambulatory Visit (INDEPENDENT_AMBULATORY_CARE_PROVIDER_SITE_OTHER): Payer: Medicare Other

## 2012-09-19 ENCOUNTER — Ambulatory Visit (INDEPENDENT_AMBULATORY_CARE_PROVIDER_SITE_OTHER): Payer: Medicare Other | Admitting: Internal Medicine

## 2012-09-19 VITALS — BP 130/70 | HR 73 | Temp 97.4°F | Wt 229.2 lb

## 2012-09-19 DIAGNOSIS — E785 Hyperlipidemia, unspecified: Secondary | ICD-10-CM

## 2012-09-19 DIAGNOSIS — R5381 Other malaise: Secondary | ICD-10-CM

## 2012-09-19 DIAGNOSIS — E119 Type 2 diabetes mellitus without complications: Secondary | ICD-10-CM

## 2012-09-19 DIAGNOSIS — R5383 Other fatigue: Secondary | ICD-10-CM

## 2012-09-19 DIAGNOSIS — Z23 Encounter for immunization: Secondary | ICD-10-CM

## 2012-09-19 DIAGNOSIS — D696 Thrombocytopenia, unspecified: Secondary | ICD-10-CM

## 2012-09-19 DIAGNOSIS — Z0000 Encounter for general adult medical examination without abnormal findings: Secondary | ICD-10-CM

## 2012-09-19 LAB — CBC WITH DIFFERENTIAL/PLATELET
Eosinophils Absolute: 0.1 10*3/uL (ref 0.0–0.7)
HCT: 45.4 % (ref 39.0–52.0)
Hemoglobin: 15.3 g/dL (ref 13.0–17.0)
MCHC: 33.7 g/dL (ref 30.0–36.0)
MCV: 92.5 fl (ref 78.0–100.0)
RDW: 14.9 % — ABNORMAL HIGH (ref 11.5–14.6)

## 2012-09-19 LAB — MICROALBUMIN / CREATININE URINE RATIO: Creatinine,U: 104.8 mg/dL

## 2012-09-19 MED ORDER — FUROSEMIDE 40 MG PO TABS: 20.0000 mg | ORAL_TABLET | Freq: Every day | ORAL | Status: DC

## 2012-09-19 MED ORDER — MELOXICAM 15 MG PO TABS: ORAL_TABLET | ORAL | Status: DC

## 2012-09-19 MED ORDER — METOPROLOL TARTRATE 25 MG PO TABS: 25.0000 mg | ORAL_TABLET | Freq: Two times a day (BID) | ORAL | Status: DC

## 2012-09-19 MED ORDER — DULOXETINE HCL 20 MG PO CPEP: ORAL_CAPSULE | ORAL | Status: DC

## 2012-09-19 MED ORDER — RANITIDINE HCL 150 MG PO TABS: 75.0000 mg | ORAL_TABLET | ORAL | Status: DC

## 2012-09-19 MED ORDER — ROPINIROLE HCL 0.25 MG PO TABS: ORAL_TABLET | ORAL | Status: DC

## 2012-09-19 MED ORDER — ALLOPURINOL 300 MG PO TABS: ORAL_TABLET | ORAL | Status: DC

## 2012-09-19 MED ORDER — ROSUVASTATIN CALCIUM 10 MG PO TABS: 10.0000 mg | ORAL_TABLET | Freq: Every day | ORAL | Status: DC

## 2012-09-19 MED ORDER — DILTIAZEM HCL ER COATED BEADS 180 MG PO CP24: 180.0000 mg | ORAL_CAPSULE | Freq: Every evening | ORAL | Status: DC

## 2012-09-19 MED ORDER — POTASSIUM CHLORIDE ER 10 MEQ PO TBCR: 10.0000 meq | EXTENDED_RELEASE_TABLET | Freq: Every day | ORAL | Status: DC

## 2012-09-19 MED ORDER — OMEPRAZOLE 20 MG PO CPDR: 20.0000 mg | DELAYED_RELEASE_CAPSULE | Freq: Every day | ORAL | Status: DC

## 2012-09-19 NOTE — Assessment & Plan Note (Signed)
Has followed with heme for same Will now monitor here as heme has left and not yet re-established

## 2012-09-19 NOTE — Assessment & Plan Note (Signed)
Diet controlled Check a1c q38mo and advised attn to weight/diet as ongoing Lab Results  Component Value Date   HGBA1C 5.9 03/21/2012

## 2012-09-19 NOTE — Progress Notes (Signed)
Subjective:    Patient ID: [name], male    DOB: 1927/03/05, 79 y.o.   MRN: [id number]  HPI  Here for medicare wellness  Diet: heart healthy and diabetic Physical activity: sedentary Depression/mood screen: negative Hearing: intact to whispered voice Visual acuity: grossly normal, performs annual eye exam  ADLs: capable Fall risk: none Home safety: good Cognitive evaluation: intact to orientation, naming, recall and repetition EOL planning: adv directives, full code/ I agree  I have personally reviewed and have noted 1. The patient's medical and social history 2. Their use of alcohol, tobacco or illicit drugs 3. Their current medications and supplements 4. The patient's functional ability including ADL's, fall risks, home safety risks and hearing or visual impairment. 5. Diet and physical activities 6. Evidence for depression or mood disorders  Also reviewed chronic med issues and interval medical events:  Past Medical History  Diagnosis Date  . Adenomatous colon polyp 03/1993  . Prostate cancer   . Depression   . OSA (obstructive sleep apnea)   . Hearing loss   . Atrial fibrillation     not anticoag candidate due to decond and falls  . Legally blind 1987  . AORTIC STENOSIS     s/p AVR 01/2010  . CLOSTRIDIUM DIFFICILE COLITIS 12/30/2009  . CORONARY ARTERY DISEASE     s/p CABG 01/28/10  . HIP REPLACEMENT, RIGHT, HX OF 2006  . HYPERTROPHY PROSTATE W/UR OBST & OTH LUTS   . MRSA infection 04/2010  . Gout   . THROMBOCYTOPENIA   . Arthritis   . DEGENERATIVE DISC DISEASE   . Diabetes mellitus, type 2   . GERD   . Hyperlipidemia   . Hypertension   . MRSA infection greater than 3 months ago     Review of Systems  Constitutional: Positive for fatigue. Negative for fever and unexpected weight change.  Respiratory: Negative for cough and shortness of breath.   Cardiovascular: Negative for chest pain and leg swelling.  Neurological: Negative for headaches.     Objective:   Physical Exam BP 130/70  Pulse 73  Temp(Src) 97.4 F (36.3 C) (Oral)  Wt 229 lb 3.2 oz (103.964 kg)  BMI 32.89 kg/m2  SpO2 98% Wt Readings from Last 3 Encounters:  09/19/12 229 lb 3.2 oz (103.964 kg)  07/09/12 226 lb (102.513 kg)  05/30/12 231 lb 12.8 oz (105.144 kg)   Constitutional:  he is overweight, appears well-developed and well-nourished. No distress. Wife at side Neck: Normal range of motion. Neck supple. No JVD present. No thyromegaly present.  Cardiovascular: Normal rate, regular rhythm and normal heart sounds.  No murmur heard. no edema BLE Pulmonary/Chest: Effort normal and breath sounds normal. No respiratory distress. no wheezes.  Neurological: he is alert and oriented to person, place, and time. No cranial nerve deficit. Coordination slow but intact, cautious high stepping gait with mild quad weakness (bend forward to stand from chair/sitting position).  Psychiatric: he has a reserved affect but bright mood and affect. behavior is normal. Judgment and thought content normal.   Lab Results  Component Value Date   WBC 4.7 05/25/2012   HGB 15.6 05/25/2012   HCT 46.0 05/25/2012   PLT 78* 05/25/2012   CHOL 151 12/04/2011   TRIG 214.0* 12/04/2011   HDL 44.80 12/04/2011   LDLDIRECT 79.0 12/04/2011   ALT 24 09/19/2011   AST 18 09/19/2011   NA 143 05/25/2012   K 4.4 05/25/2012   CL 103 05/25/2012   CREATININE 0.90 05/25/2012  BUN 16 05/25/2012   CO2 28 09/19/2011   TSH 1.55 09/19/2011   PSA 0.21 09/13/2010   INR 1.04 12/05/2010   HGBA1C 5.9 03/21/2012        Assessment & Plan:   AWV/v70.0 - Today patient counseled on age appropriate routine health concerns for screening and prevention, each reviewed and up to date or declined. Immunizations reviewed and up to date or declined. Labs/ECG reviewed. Risk factors for depression reviewed and negative. Hearing function and visual acuity are intact. ADLs screened and addressed as needed. Functional ability and level of safety  reviewed and appropriate. Education, counseling and referrals performed based on assessed risks today. Patient provided with a copy of personalized plan for preventive services.  Fatigue - nonspecific symptoms/exam - check screening labs  Also see problem list. Medications and labs reviewed today.

## 2012-09-19 NOTE — Patient Instructions (Signed)
It was good to see you today. We have reviewed your prior records including labs and tests today Health Maintenance reviewed - all recommended immunizations and age-appropriate screenings are up-to-date. Test(s) ordered today. Your results will be released to MyChart (or called to you) after review, usually within 72hours after test completion. If any changes need to be made, you will be notified at that same time. Medications reviewed and updated, no changes recommended at this time. Refill on medication(s) as discussed today. Please schedule followup in 6 months, call sooner if problems.

## 2012-09-19 NOTE — Assessment & Plan Note (Signed)
rx'd statin due to hx CAD Briefly stopped taking crestor 06/2011 - 09/2011 due to leg pain (unchanged with or without med) last lipids at goal - check annually The current medical regimen is effective;  continue present plan and medications.

## 2012-09-20 LAB — LIPID PANEL
HDL: 48 mg/dL (ref >39.00)
LDL Cholesterol: 57 mg/dL (ref 0–99)
Total CHOL/HDL Ratio: 3
Triglycerides: 186 mg/dL — ABNORMAL HIGH (ref 0.0–149.0)
VLDL: 37.2 mg/dL (ref 0.0–40.0)

## 2012-09-20 LAB — BASIC METABOLIC PANEL
CO2: 29 mEq/L (ref 19–32)
Calcium: 9.7 mg/dL (ref 8.4–10.5)
Creatinine, Ser: 1 mg/dL (ref 0.4–1.5)

## 2012-10-10 ENCOUNTER — Encounter: Payer: Self-pay | Admitting: Pulmonary Disease

## 2012-10-10 ENCOUNTER — Ambulatory Visit (INDEPENDENT_AMBULATORY_CARE_PROVIDER_SITE_OTHER): Payer: Medicare Other | Admitting: Pulmonary Disease

## 2012-10-10 VITALS — BP 120/72 | HR 70 | Temp 97.0°F | Ht 70.0 in | Wt 233.8 lb

## 2012-10-10 DIAGNOSIS — G4733 Obstructive sleep apnea (adult) (pediatric): Secondary | ICD-10-CM

## 2012-10-10 NOTE — Patient Instructions (Addendum)
We will try to get you a new cpap machine OK to take a nap daily Requip can make you sleepy We discussed role of stimulant - rather not use it

## 2012-10-10 NOTE — Progress Notes (Signed)
  Subjective:    Patient ID: [name], male    DOB: September 18, 1927, 79 y.o.   MRN: [id number]  HPI   85/M for FU of obstructive sleep apnea & excessive somnolence / restless legs Oct '11 >> staph sepsis  Nov 11 >> c .diff colitis, low plts  Underwent CABG x3 Pig AVR in jan '12, in cardiac rehab  H/o Monocytosis, thrombocytopenia - ? Idiopathic, neg BM biopsy (Ha)  He had remote sleep study in [place], another Study in 2005  On VPAP auto , mirage full face mask, pr increased to 12 cm in jan'12 after download check  Download 9/17-05/02/10 shows avg pr 15 cm, AHI 14.4 residual with predominant hypopneas, some leak & excellent compliance >> Pr increased to 17 cm  Reviewed ONO on CPAP >> no desaturation , Does not need O2 Download 8/2 -09/07/10 showed avg pressure 11 cm, avg usage 5 h, minimal leak  09/18/2011 VPAP auto download shows residual AHI 9/h, leak ++, excellent usage on 17 cm (14 on expiration)    10/10/2012 1 yr FU Pt reports he reports he wears his CPAP everynight x 12-13 hrs a night. Pt reports he does feel tired all the time. Pt spouse reports during the day he takes several naps and she notices he snores.  Remains sleepy, Takes requip at bedtime  MRI 05/2012 - old CVA  Recurrent sinus infections - caused c difff C/o dry mouth inspite of FF mask ESS 18 , bedtime 10p, minimal latency, Nocturia + since prostate sx, oob at 0930 , feels refreshed , no headaches, daytime naps +   Review of Systems neg for any significant sore throat, dysphagia, itching, sneezing, nasal congestion or excess/ purulent secretions, fever, chills, sweats, unintended wt loss, pleuritic or exertional cp, hempoptysis, orthopnea pnd or change in chronic leg swelling. Also denies presyncope, palpitations, heartburn, abdominal pain, nausea, vomiting, diarrhea or change in bowel or urinary habits, dysuria,hematuria, rash, arthralgias, visual complaints, headache, numbness weakness or ataxia.      Objective:   Physical Exam  Gen. Pleasant, obese, in no distress ENT - no lesions, no post nasal drip Neck: No JVD, no thyromegaly, no carotid bruits Lungs: no use of accessory muscles, no dullness to percussion, decreased without rales or rhonchi  Cardiovascular: Rhythm regular, heart sounds  normal, no murmurs or gallops, no peripheral edema Musculoskeletal: No deformities, no cyanosis or clubbing , no tremors       Assessment & Plan:

## 2012-10-14 ENCOUNTER — Telehealth: Payer: Self-pay | Admitting: *Deleted

## 2012-10-14 DIAGNOSIS — G4733 Obstructive sleep apnea (adult) (pediatric): Secondary | ICD-10-CM

## 2012-10-14 NOTE — Telephone Encounter (Signed)
Message copied by Tommie Sams on Mon Oct 14, 2012  1:39 PM ------      Message from: Henderson Newcomer      Created: Mon Oct 14, 2012  1:27 PM        We think he should be more than eligible for a new machine since his sleep study was done in 2002.  We will just need new orders, including pressure settings since we did not provide his original equipment.      Thanks Kristy Catoe      ----- Message -----         From: Tommie Sams, CMA         Sent: 10/10/2012   2:39 PM           To: Melissa Stenson            Order placed for pt in EPIC thanks       ------

## 2012-10-14 NOTE — Telephone Encounter (Signed)
Pl obtian download on his current machine to decide settings

## 2012-10-14 NOTE — Telephone Encounter (Signed)
I have sent order to [facility] and staff message to [facility].

## 2012-10-14 NOTE — Telephone Encounter (Signed)
Please advise Dr. Alva thanks 

## 2012-10-14 NOTE — Assessment & Plan Note (Signed)
Try to get him a new cpap machine -obtain download on current settings or on auto OK to take a nap daily Requip is small dose so doubt this is the cause We discussed role of modafinil  - rather not use it given crdiac issues

## 2012-11-26 ENCOUNTER — Ambulatory Visit: Payer: Medicare Other

## 2012-11-28 ENCOUNTER — Ambulatory Visit (INDEPENDENT_AMBULATORY_CARE_PROVIDER_SITE_OTHER): Payer: Medicare Other | Admitting: Cardiovascular Disease

## 2012-11-28 ENCOUNTER — Encounter: Payer: Self-pay | Admitting: Cardiovascular Disease

## 2012-11-28 ENCOUNTER — Ambulatory Visit (INDEPENDENT_AMBULATORY_CARE_PROVIDER_SITE_OTHER): Payer: Medicare Other | Admitting: *Deleted

## 2012-11-28 VITALS — BP 150/70 | HR 56 | Ht 70.0 in | Wt 225.0 lb

## 2012-11-28 DIAGNOSIS — E785 Hyperlipidemia, unspecified: Secondary | ICD-10-CM

## 2012-11-28 DIAGNOSIS — I4891 Unspecified atrial fibrillation: Secondary | ICD-10-CM

## 2012-11-28 DIAGNOSIS — Z23 Encounter for immunization: Secondary | ICD-10-CM

## 2012-11-28 DIAGNOSIS — I10 Essential (primary) hypertension: Secondary | ICD-10-CM

## 2012-11-28 DIAGNOSIS — Z952 Presence of prosthetic heart valve: Secondary | ICD-10-CM

## 2012-11-28 DIAGNOSIS — Z954 Presence of other heart-valve replacement: Secondary | ICD-10-CM

## 2012-11-28 NOTE — Assessment & Plan Note (Signed)
Well-controlled on current medications 

## 2012-11-28 NOTE — Progress Notes (Signed)
11/28/2012 [name]   [birth year]  [id number]  Primary Physician Rene Paci, MD Primary Cardiologist: Runell Gess MD [name]   HPI:  The patient is an a 79-year-old mildly-overweight married Caucasian male father of 2, grandfather of 1 grandchild, who is accompanied by his wife [name] today, who is also a patient of mine. I saw him and his wife both back 6 months ago. He has a history of insulin-dependent diabetes, hypertension, hyperlipidemia, and a strong family history of heart disease. He had critical aortic stenosis and a normal LV function with decreased exercise tolerance and increased dyspnea. I catheterized him January 04, 2010, revealing moderate but not critical CAD with severe aortic stenosis and a valve area of 0.71 sq cm. On February 17, 2010, he underwent coronary artery bypass grafting x2 with a LIMA to his LAD and a vein to an obtuse marginal branch and PDA as well as aortic valve replacement with an [employer] 21 pericardial tissue valve. His postoperative course was complicated by paroxysmal AFib and he was on amiodarone briefly, which he did not tolerate. He was re-admitted January 1-31 because of deconditioning. A 2D echo performed 3 months postoperatively showed a normal, functioning aortic prosthesis with normal LV function. Recent carotid Dopplers in June revealed only mild right ICA stenosis. Most recent lab work 09/19/12 revealed a total cholesterol of 142, LDL of 57 an HDL of 48. He is completely asymptomatic.    Current Outpatient Prescriptions  Medication Sig Dispense Refill  . acetaminophen (TYLENOL) 500 MG tablet Take 650 mg by mouth 2 (two) times daily as needed for pain.       Marland Kitchen allopurinol (ZYLOPRIM) 300 MG tablet Take 1 tablet by mouth at  bedtime  90 tablet  3  . Ascorbic Acid (VITAMIN C PO) Take 1 tablet by mouth every morning.        Marland Kitchen CALCIUM PO Take 1 tablet by mouth every morning.        . cetirizine (ZYRTEC  ALLERGY) 10 MG tablet Take 10 mg by mouth daily.      . Cholecalciferol (VITAMIN D-3 PO) Take 2,000 Units by mouth daily.        Marland Kitchen diltiazem (CARDIZEM CD) 180 MG 24 hr capsule Take 1 capsule (180 mg total) by mouth every evening.  90 capsule  3  . docusate sodium (COLACE) 100 MG capsule Take 100 mg by mouth daily.        . DULoxetine (CYMBALTA) 20 MG capsule Take 1 capsule by mouth  daily  90 capsule  3  . furosemide (LASIX) 40 MG tablet Take 0.5 tablets (20 mg total) by mouth daily.  45 tablet  3  . meloxicam (MOBIC) 15 MG tablet Take 1 tablet by mouth  every morning  90 tablet  3  . metoprolol tartrate (LOPRESSOR) 25 MG tablet Take 1 tablet (25 mg total) by mouth 2 (two) times daily.  180 tablet  3  . Multiple Vitamins-Minerals (ICAPS MV PO) Take 1 tablet by mouth every morning.        . potassium chloride (K-DUR) 10 MEQ tablet Take 1 tablet (10 mEq total) by mouth daily.  90 tablet  3  . Pyridoxine HCl (VITAMIN B-6 PO) Take 1 tablet by mouth every morning.        . ranitidine (ZANTAC) 150 MG tablet Take 0.5 tablets (75 mg total) by mouth every morning.  45 tablet  3  . rOPINIRole (REQUIP) 0.25 MG tablet  Take 3 tablets by mouth at  bedtime  270 tablet  3  . rosuvastatin (CRESTOR) 10 MG tablet Take 1 tablet (10 mg total) by mouth at bedtime.  90 tablet  3  . Thiamine HCl (VITAMIN B-1 PO) Take 1 tablet by mouth every morning.         No current facility-administered medications for this visit.    Allergies  Allergen Reactions  . Doxycycline     rash  . Septra [Bactrim]     hallucinations  . Morphine Anxiety    REACTION: itching    History   Social History  . Marital Status: Married    Spouse Name: N/A    Number of Children: 2  . Years of Education: N/A   Occupational History  . Retired     Environmental manager   Social History Main Topics  . Smoking status: Former Smoker -- 1.00 packs/day for 2 years    Types: Cigars    Quit date: 07/01/2009  . Smokeless tobacco: Never Used      Comment: Married, lives with spouse-2 kids. retired Environmental manager  . Alcohol Use: 4.2 oz/week    7 Cans of beer per week  . Drug Use: No  . Sexual Activity: Not on file   Other Topics Concern  . Not on file   Social History Narrative  . No narrative on file     Review of Systems: General: negative for chills, fever, night sweats or weight changes.  Cardiovascular: negative for chest pain, dyspnea on exertion, edema, orthopnea, palpitations, paroxysmal nocturnal dyspnea or shortness of breath Dermatological: negative for rash Respiratory: negative for cough or wheezing Urologic: negative for hematuria Abdominal: negative for nausea, vomiting, diarrhea, bright red blood per rectum, melena, or hematemesis Neurologic: negative for visual changes, syncope, or dizziness All other systems reviewed and are otherwise negative except as noted above.    Blood pressure 150/70, pulse 56, height 5\' 10"  (1.778 m), weight 225 lb (102.059 kg).  General appearance: alert and no distress Neck: no adenopathy, no carotid bruit, no JVD, supple, symmetrical, trachea midline and thyroid not enlarged, symmetric, no tenderness/mass/nodules Lungs: clear Heart: soft outflow tract murmur Extremities: extremities normal, atraumatic, no cyanosis or edema  EKG sinus bradycardia at 56 without ST or T wave changes  ASSESSMENT AND PLAN:   S/P CABG x 3 Status post coronary artery bypass grafting x3  02/17/10 with a LIMA to his LAD and a vein to an obtuse marginal branch and the PDA.his last Myoview stress test 05/12/10 was nonischemic. He denies chest pain or shortness of breath.  S/P aortic valve replacement Status post aortic valve replacement with a bio prosthesis [employer] life sights 21 mm pericardial tissue valve at the time of coronary bypass grafting 02/17/10. His last 2-D echo was performed in May of this year and was normal. We will recheck this in the next May.  HYPERLIPIDEMIA On statin therapy with  excellent lipid profile for secondary prevention  HYPERTENSION Well-controlled on current medications      Runell Gess MD [facility], [facility] 11/28/2012 11:33 AM

## 2012-11-28 NOTE — Assessment & Plan Note (Signed)
Status post aortic valve replacement with a bio prosthesis Edwards life sights 21 mm pericardial tissue valve at the time of coronary bypass grafting 02/17/10. His last 2-D echo was performed in May of this year and was normal. We will recheck this in the next May.

## 2012-11-28 NOTE — Assessment & Plan Note (Signed)
Status post coronary artery bypass grafting x3  02/17/10 with a LIMA to his LAD and a vein to an obtuse marginal branch and the PDA.his last Myoview stress test 05/12/10 was nonischemic. He denies chest pain or shortness of breath.

## 2012-11-28 NOTE — Assessment & Plan Note (Signed)
On statin therapy with excellent lipid profile for secondary prevention 

## 2012-11-28 NOTE — Patient Instructions (Signed)
  We will see you back in follow up in 1 year with Dr Allyson Sabal  Dr Allyson Sabal has ordered echocardiogram to be done May 2015

## 2012-12-27 ENCOUNTER — Other Ambulatory Visit (INDEPENDENT_AMBULATORY_CARE_PROVIDER_SITE_OTHER): Payer: Medicare Other

## 2012-12-27 ENCOUNTER — Encounter: Payer: Self-pay | Admitting: Nurse Practitioner

## 2012-12-27 ENCOUNTER — Ambulatory Visit (INDEPENDENT_AMBULATORY_CARE_PROVIDER_SITE_OTHER): Payer: Medicare Other | Admitting: Nurse Practitioner

## 2012-12-27 ENCOUNTER — Ambulatory Visit: Payer: Medicare Other

## 2012-12-27 VITALS — BP 104/60 | HR 76 | Temp 98.9°F | Ht 70.0 in | Wt 213.8 lb

## 2012-12-27 DIAGNOSIS — R059 Cough, unspecified: Secondary | ICD-10-CM

## 2012-12-27 DIAGNOSIS — J329 Chronic sinusitis, unspecified: Secondary | ICD-10-CM

## 2012-12-27 DIAGNOSIS — R066 Hiccough: Secondary | ICD-10-CM

## 2012-12-27 DIAGNOSIS — R05 Cough: Secondary | ICD-10-CM

## 2012-12-27 LAB — COMPREHENSIVE METABOLIC PANEL
ALT: 32 U/L (ref 0–53)
AST: 30 U/L (ref 0–37)
Albumin: 4.7 g/dL (ref 3.5–5.2)
Alkaline Phosphatase: 82 U/L (ref 39–117)
CO2: 30 mEq/L (ref 19–32)
Calcium: 9.7 mg/dL (ref 8.4–10.5)
Glucose, Bld: 108 mg/dL — ABNORMAL HIGH (ref 70–99)
Potassium: 4 mEq/L (ref 3.5–5.1)
Sodium: 141 mEq/L (ref 135–145)
Total Protein: 7.5 g/dL (ref 6.0–8.3)

## 2012-12-27 LAB — CBC WITH DIFFERENTIAL/PLATELET
Basophils Absolute: 0 10*3/uL (ref 0.0–0.1)
Basophils Relative: 0 % (ref 0.0–3.0)
Eosinophils Absolute: 0.1 10*3/uL (ref 0.0–0.7)
HCT: 46.2 % (ref 39.0–52.0)
Hemoglobin: 15.6 g/dL (ref 13.0–17.0)
Lymphs Abs: 0.9 10*3/uL (ref 0.7–4.0)
MCHC: 33.7 g/dL (ref 30.0–36.0)
MCV: 93.1 fl (ref 78.0–100.0)
Monocytes Absolute: 4.5 10*3/uL — ABNORMAL HIGH (ref 0.1–1.0)
Neutro Abs: 7.2 10*3/uL (ref 1.4–7.7)
Platelets: 94 10*3/uL — ABNORMAL LOW (ref 150.0–400.0)
RDW: 14.8 % — ABNORMAL HIGH (ref 11.5–14.6)

## 2012-12-27 LAB — AMYLASE: Amylase: 63 U/L (ref 27–131)

## 2012-12-27 MED ORDER — AMOXICILLIN-POT CLAVULANATE 875-125 MG PO TABS: 1.0000 | ORAL_TABLET | Freq: Two times a day (BID) | ORAL | Status: DC

## 2012-12-27 NOTE — Patient Instructions (Signed)
Please start antibiotic and Lloyd Huger med sinus rinse daily. Call us if you are not feeling better in 1 week or feel worse-run fever, develop chest pain before then. Please get labs done to evaluate causes of hiccups. Our office will call you with results.  Hiccups Hiccups are caused by a sudden contraction of the muscles between the ribs and the muscle under your lungs (diaphragm). When you hiccup, the top of your windpipe (glottis) closes immediately after your diaphragm contracts. This makes the typical 'hic' sound. A hiccup is a reflex that you cannot stop. Unlike other reflexes such as coughing and sneezing, hiccups do not seem to have any useful purpose. There are 3 types of hiccups:   Benign bouts: last less than 48 hours.  Persistent: last more than 48 hours but less than 1 month.  Intractable: last more than 1 month. CAUSES  Most people have bouts of hiccups from time to time. They start for no apparent reason, last a short while, and then stop. Sometimes they are due to:  A temporary swollen stomach caused by overeating or eating too fast, eating spicy foods, drinking fizzy drinks, or swallowing air.  A sudden change in temperature (very hot or cold foods or drinks, a cold shower).  Drinking alcohol or using tobacco. There are no particular tests used to diagnose hiccups. Hiccups are usually considered harmless and do not point to a serious medical condition. However, there can be underlying medical problems that may cause hiccups, such as pneumonia, diabetes, metabolic problems, tumors, abdominal infections or injuries, and neurologic problems.You must follow up with your caregiver if your symptoms persist or become a frequent problem. TREATMENT   Most cases need no treatment. A bout of benign hiccups usually does not last long.  Medicine is sometimes needed to stop persistent hiccups. Medicine may be given intravenously (IV) or by mouth.  Hypnosis or acupuncture may be  suggested.  Surgery to affect the nerve that supplies the diaphragm may be tried in severe cases.  Treatment of an underlying cause is needed in some cases. HOME CARE INSTRUCTIONS  Popular remedies that may stop a bout of hiccups include:  Gargling ice water.  Swallowing granulated sugar.  Biting on a lemon.  Holding your breath, breathing fast, or breathing into a paper bag.  Bearing down.  Gasping after a sudden fright.  Pulling your tongue gently.  Distraction. SEEK MEDICAL CARE IF:   Hiccups last for more than 48 hours.  You are given medicine but your hiccups do not get better.  New symptoms show up.  You cannot sleep or eat due to the hiccups.  You have unexpected weight loss.  You have trouble breathing or swallowing.  You develop severe pain in your abdomen or other areas.  You develop numbness, tingling, or weakness. Document Released: 03/20/2001 Document Revised: 04/03/2011 Document Reviewed: 03/02/2010 [facility] Patient Information 2014 [place], [place].

## 2012-12-27 NOTE — Progress Notes (Signed)
Pre-visit discussion using our clinic review tool. No additional management support is needed unless otherwise documented below in the visit note.  

## 2012-12-27 NOTE — Progress Notes (Signed)
   Subjective:    Patient ID: [name], male    DOB: [birth year], 79 y.o.   MRN: [id number]  HPI Comments: Accompanied by wife. Pertinent Hx: pig valve, c-diff infection w/hospitalization 2012.  URI  This is a new problem. The current episode started in the past 7 days (3da). The problem has been gradually worsening. There has been no fever. Associated symptoms include congestion, coughing, headaches, sinus pain and a sore throat. Pertinent negatives include no abdominal pain, chest pain, diarrhea, ear pain, nausea, swollen glands, vomiting or wheezing. Treatments tried: mucolytic. The treatment provided mild relief.      Review of Systems  Constitutional: Negative for fever, chills, activity change, appetite change and fatigue.  HENT: Positive for congestion, nosebleeds, postnasal drip, sinus pressure and sore throat. Negative for ear pain.   Eyes: Negative for redness.  Respiratory: Positive for cough. Negative for wheezing.   Cardiovascular: Negative for chest pain.  Gastrointestinal: Negative for nausea, vomiting, abdominal pain and diarrhea.       Hiccups for 2 days.  Musculoskeletal: Negative for back pain.  Neurological: Positive for headaches.  Hematological: Negative for adenopathy.  Psychiatric/Behavioral:       Slowed mentation       Objective:   Physical Exam  Vitals reviewed. Constitutional: He is oriented to person, place, and time. He appears well-developed and well-nourished. No distress.  Room smells like ETOH  HENT:  Head: Normocephalic and atraumatic.  Right Ear: External ear normal. Tympanic membrane is retracted. A middle ear effusion is present.  Left Ear: External ear normal. Tympanic membrane is retracted. A middle ear effusion is present.  Nose: Mucosal edema and rhinorrhea present.  Eyes: Conjunctivae are normal. Right eye exhibits no discharge. Left eye exhibits no discharge.  Neck: Normal range of motion. Neck supple. No thyromegaly present.   Cardiovascular: Normal rate and regular rhythm.   Pulmonary/Chest: Effort normal and breath sounds normal. No respiratory distress. He has no wheezes.  Some coughing during exam  Abdominal:  Frequent hiccups when 1st arrived, then completely stopped suddenly. No hiccups for last 15 minutes of exam.  Musculoskeletal:  Has parkinsonian gait.  Lymphadenopathy:    He has no cervical adenopathy.  Neurological: He is alert and oriented to person, place, and time.  Skin: Skin is warm and dry.  Psychiatric: He has a normal mood and affect. His behavior is normal. Thought content normal.          Assessment & Plan:  1. Hiccups Gives Hx 2 days hiccups. - Comprehensive metabolic panel; Future - Amylase; Future - Lipase; Future - CBC with Differential; Future  2. Unspecified sinusitis (chronic) & cough See pt instructions - amoxicillin-clavulanate (AUGMENTIN) 875-125 MG per tablet; Take 1 tablet by mouth 2 (two) times daily.  Dispense: 10 tablet; Refill: 0

## 2012-12-29 ENCOUNTER — Other Ambulatory Visit: Payer: Self-pay | Admitting: Nurse Practitioner

## 2012-12-29 DIAGNOSIS — R718 Other abnormality of red blood cells: Secondary | ICD-10-CM

## 2012-12-30 ENCOUNTER — Telehealth: Payer: Self-pay | Admitting: Hematology and Oncology

## 2012-12-30 ENCOUNTER — Telehealth: Payer: Self-pay | Admitting: Nurse Practitioner

## 2012-12-30 ENCOUNTER — Other Ambulatory Visit: Payer: Self-pay | Admitting: Hematology and Oncology

## 2012-12-30 ENCOUNTER — Telehealth: Payer: Self-pay | Admitting: *Deleted

## 2012-12-30 NOTE — Telephone Encounter (Signed)
called again sw pt made aware of 12/9 at 9a appt shh

## 2012-12-30 NOTE — Telephone Encounter (Signed)
New lymphopenia. Ongoing thrombocytopenia and monocytosis.  W/u by Dr Raliegh Ip 7/13, essentially neg. Pt was to f/u in 1 yr. No f/u. Spoke w/pt's wife. Pt feeling much better. Slept with cpap for 12 hours yesterday-she states he had not been getting sleep due to hiccoughs. Discussed labs & need forf/u w/onc. Also recmd f/u w/Dr Felicity Coyer in 10 days for acute illness.

## 2012-12-30 NOTE — Telephone Encounter (Signed)
Pt already notified of appointment

## 2012-12-30 NOTE — Telephone Encounter (Signed)
appt made per 12/8 POF for 12/9 at 9am tried to call pt at home phone no answer or machine shh

## 2012-12-31 ENCOUNTER — Ambulatory Visit (HOSPITAL_BASED_OUTPATIENT_CLINIC_OR_DEPARTMENT_OTHER): Payer: Medicare Other | Admitting: Hematology and Oncology

## 2012-12-31 ENCOUNTER — Encounter: Payer: Self-pay | Admitting: Hematology and Oncology

## 2012-12-31 ENCOUNTER — Encounter: Payer: Self-pay | Admitting: Internal Medicine

## 2012-12-31 VITALS — BP 146/50 | HR 63 | Temp 98.1°F | Resp 18 | Ht 70.0 in | Wt 219.4 lb

## 2012-12-31 DIAGNOSIS — D696 Thrombocytopenia, unspecified: Secondary | ICD-10-CM

## 2012-12-31 DIAGNOSIS — D72821 Monocytosis (symptomatic): Secondary | ICD-10-CM

## 2012-12-31 NOTE — Progress Notes (Signed)
[facility] OFFICE PROGRESS NOTE  Patient Care Team: Newt Lukes, MD as PCP - General (Internal Medicine) Runell Gess, MD (Cardiology) Wonda Amis, MD (Dermatology) Exie Parody, MD as Consulting Physician (Hematology) Cliffton Asters, MD as Consulting Physician (Infectious Diseases) Oretha Milch, MD as Consulting Physician (Pulmonary Disease) Esaw Dace, MD as Consulting Physician (Urology) Meryl Dare, MD as Consulting Physician (Gastroenterology) Dorothyann Gibbs, MD as Consulting Physician (Ophthalmology)  DIAGNOSIS: Chronic monocytosis and thrombocytopenia  SUMMARY OF ONCOLOGIC HISTORY: This is a pleasant 79 year old gentleman with chronic monocytosis and thrombocytopenia. 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 male returns for further followup. He denies any recent infection. The patient denies any recent signs or symptoms of bleeding such as spontaneous epistaxis, hematuria or hematochezia.  I have reviewed the past medical history, past surgical history, social history and family history with the patient and they are unchanged from previous note.  ALLERGIES:  is allergic to doxycycline; septra; and morphine.  MEDICATIONS:  Current Outpatient Prescriptions  Medication Sig Dispense Refill  . acetaminophen (TYLENOL) 500 MG tablet Take 650 mg by mouth 2 (two) times daily as needed for pain.       Marland Kitchen allopurinol (ZYLOPRIM) 300 MG tablet Take 1 tablet by mouth at  bedtime  90 tablet  3  . amoxicillin-clavulanate (AUGMENTIN) 875-125 MG per tablet Take 1 tablet by mouth 2 (two) times daily.  10 tablet  0  . Ascorbic Acid (VITAMIN C PO) Take 1 tablet by mouth every morning.        Marland Kitchen CALCIUM PO Take 1 tablet by mouth every morning.        . cetirizine (ZYRTEC ALLERGY) 10 MG tablet Take 10 mg by mouth daily.      . Cholecalciferol (VITAMIN D-3 PO) Take 2,000 Units  by mouth daily.        Marland Kitchen diltiazem (CARDIZEM CD) 180 MG 24 hr capsule Take 1 capsule (180 mg total) by mouth every evening.  90 capsule  3  . docusate sodium (COLACE) 100 MG capsule Take 100 mg by mouth daily.        . DULoxetine (CYMBALTA) 20 MG capsule Take 1 capsule by mouth  daily  90 capsule  3  . furosemide (LASIX) 40 MG tablet Take 0.5 tablets (20 mg total) by mouth daily.  45 tablet  3  . meloxicam (MOBIC) 15 MG tablet Take 1 tablet by mouth  every morning  90 tablet  3  . metoprolol tartrate (LOPRESSOR) 25 MG tablet Take 1 tablet (25 mg total) by mouth 2 (two) times daily.  180 tablet  3  . Multiple Vitamins-Minerals (ICAPS MV PO) Take 1 tablet by mouth every morning.        Marland Kitchen omeprazole (PRILOSEC) 20 MG capsule       . potassium chloride (K-DUR) 10 MEQ tablet Take 1 tablet (10 mEq total) by mouth daily.  90 tablet  3  . Pyridoxine HCl (VITAMIN B-6 PO) Take 1 tablet by mouth every morning.        . ranitidine (ZANTAC) 150 MG tablet Take 0.5 tablets (75 mg total) by mouth every morning.  45 tablet  3  . rOPINIRole (REQUIP) 0.25 MG tablet Take 3 tablets by mouth at  bedtime  270 tablet  3  . rosuvastatin (CRESTOR) 10 MG tablet Take 1 tablet (10 mg total) by mouth at  bedtime.  90 tablet  3  . Thiamine HCl (VITAMIN B-1 PO) Take 1 tablet by mouth every morning.         No current facility-administered medications for this visit.    REVIEW OF SYSTEMS:   Constitutional: Denies fevers, chills or abnormal weight loss Eyes: Denies blurriness of vision Ears, nose, mouth, throat, and face: Denies mucositis or sore throat Respiratory: Denies cough, dyspnea or wheezes Cardiovascular: Denies palpitation, chest discomfort or lower extremity swelling Gastrointestinal:  Denies nausea, heartburn or change in bowel habits Skin: Denies abnormal skin rashes Lymphatics: Denies new lymphadenopathy or easy bruising Neurological:Denies numbness, tingling or new weaknesses Behavioral/Psych: Mood is  stable, no new changes  All other systems were reviewed with the patient and are negative.  PHYSICAL EXAMINATION: ECOG PERFORMANCE STATUS: 1 - Symptomatic but completely ambulatory  Filed Vitals:   12/31/12 0908  BP: 146/50  Pulse: 63  Temp: 98.1 F (36.7 C)  Resp: 18   Filed Weights   12/31/12 0908  Weight: 219 lb 6.4 oz (99.519 kg)    GENERAL:alert, no distress and comfortable. He looks elderly SKIN: skin color, texture, turgor are normal, no rashes or significant lesions apart  from keratosis EYES: normal, Conjunctiva are pink and non-injected, sclera clear OROPHARYNX:no exudate, no erythema and lips, buccal mucosa, and tongue normal  NECK: supple, thyroid normal size, non-tender, without nodularity LYMPH:  no palpable lymphadenopathy in the cervical, axillary or inguinal LUNGS: clear to auscultation and percussion with normal breathing effort HEART: regular rate & rhythm and no murmurs and no lower extremity edema ABDOMEN:abdomen soft, non-tender and normal bowel sounds. No palpable splenomegaly Musculoskeletal:no cyanosis of digits and no clubbing  NEURO: alert & oriented x 3 with fluent speech, no focal motor/sensory deficits  LABORATORY DATA:  I have reviewed the data as listed    Component Value Date/Time   NA 141 12/27/2012 1304   K 4.0 12/27/2012 1304   CL 102 12/27/2012 1304   CO2 30 12/27/2012 1304   GLUCOSE 108* 12/27/2012 1304   BUN 12 12/27/2012 1304   CREATININE 1.1 12/27/2012 1304   CALCIUM 9.7 12/27/2012 1304   PROT 7.5 12/27/2012 1304   ALBUMIN 4.7 12/27/2012 1304   AST 30 12/27/2012 1304   ALT 32 12/27/2012 1304   ALKPHOS 82 12/27/2012 1304   BILITOT 1.0 12/27/2012 1304   GFRNONAA 77* 12/05/2010 0833   GFRAA 89* 12/05/2010 0833    No results found for this basename: SPEP, UPEP,  kappa and lambda light chains    Lab Results  Component Value Date   WBC 12.6* 12/27/2012   NEUTROABS 7.2 12/27/2012   HGB 15.6 12/27/2012   HCT 46.2 12/27/2012   MCV 93.1  12/27/2012   PLT 94.0* 12/27/2012      Chemistry      Component Value Date/Time   NA 141 12/27/2012 1304   K 4.0 12/27/2012 1304   CL 102 12/27/2012 1304   CO2 30 12/27/2012 1304   BUN 12 12/27/2012 1304   CREATININE 1.1 12/27/2012 1304      Component Value Date/Time   CALCIUM 9.7 12/27/2012 1304   ALKPHOS 82 12/27/2012 1304   AST 30 12/27/2012 1304   ALT 32 12/27/2012 1304   BILITOT 1.0 12/27/2012 1304       RADIOGRAPHIC STUDIES: I reviewed report of his last CT scan dated several years ago. He suggested fatty infiltration of his liver I have personally reviewed the radiological images as listed and agreed with the findings  in the report.    ASSESSMENT & PLAN:  #1 chronic thrombocytopenia I suspect the patient may have diagnosis of ITP. His platelet counts tend to fluctuate a lot over the years. He also had fatty liver disease due to chronic diabetes and hyperlipidemia. Previous bone marrow biopsy suggested large platelets and increased megakaryocytes which are consistent with ITP. The patient does not need treatment. His wife is concerned about him being taken off antiplatelet agents for minor surgical procedures. As long as his platelet count remain over 50,000, he should be able to proceed with surgery. I do not see a contraindication for him to remain on low dose aspirin for most procedures. However the final decision rests in the hands of the operating physician and the risk of bleeding has to be discussed with patient and family members with informed consent before proceeding with surgery. #2 chronic monocytosis, suspect CMML He has chronic monocytosis for many years and I think he fulfills the criteria for possible diagnosis of CMML. I educated the patient and family members about natural history of the disease. In his case, it has changed very little over the years. I recommend twice a year followup in the hematology clinic for now. He does not require treatment for this. Orders  Placed This Encounter  Procedures  . CBC with Differential    Standing Status: Future     Number of Occurrences:      Standing Expiration Date: 09/22/2013   All questions were answered. The patient knows to call the clinic with any problems, questions or concerns. No barriers to learning was detected. I spent 25 minutes counseling the patient face to face. The total time spent in the appointment was 40 minutes and more than 50% was on counseling and review of test results     [facility], [name] Greulich, MD 12/31/2012 12:11 PM

## 2013-01-01 ENCOUNTER — Telehealth: Payer: Self-pay | Admitting: Hematology and Oncology

## 2013-01-01 NOTE — Telephone Encounter (Signed)
worked 12/10 POF Appts made LVMM on pts home phone CAL June 15 mailed to pt shh

## 2013-01-06 ENCOUNTER — Encounter: Payer: Self-pay | Admitting: Internal Medicine

## 2013-01-06 ENCOUNTER — Ambulatory Visit (INDEPENDENT_AMBULATORY_CARE_PROVIDER_SITE_OTHER): Payer: Medicare Other | Admitting: Internal Medicine

## 2013-01-06 VITALS — BP 140/68 | HR 63 | Temp 97.8°F | Wt 217.8 lb

## 2013-01-06 DIAGNOSIS — C9210 Chronic myeloid leukemia, BCR/ABL-positive, not having achieved remission: Secondary | ICD-10-CM

## 2013-01-06 DIAGNOSIS — D696 Thrombocytopenia, unspecified: Secondary | ICD-10-CM

## 2013-01-06 DIAGNOSIS — C9310 Chronic myelomonocytic leukemia not having achieved remission: Secondary | ICD-10-CM

## 2013-01-06 DIAGNOSIS — J019 Acute sinusitis, unspecified: Secondary | ICD-10-CM

## 2013-01-06 MED ORDER — FLUTICASONE PROPIONATE 50 MCG/ACT NA SUSP: 1.0000 | Freq: Every day | NASAL | Status: DC

## 2013-01-06 MED ORDER — LEVOFLOXACIN 500 MG PO TABS: 500.0000 mg | ORAL_TABLET | Freq: Every day | ORAL | Status: DC

## 2013-01-06 NOTE — Progress Notes (Signed)
Pre-visit discussion using our clinic review tool. No additional management support is needed unless otherwise documented below in the visit note.  

## 2013-01-06 NOTE — Patient Instructions (Addendum)
It was good to see you today.  We have reviewed your prior records including labs and tests today  Begin Levaquin antibiotics once daily for 7 days and use Flonase or generic nasal steroid each day until gone Your prescription(s) have been submitted to your pharmacy. Please take as directed and contact our office if you believe you are having problem(s) with the medication(s).  Continue Mucinex as needed for cough and mucus/congestion  Sinusitis Sinusitis is redness, soreness, and puffiness (inflammation) of the air pockets in the bones of your face (sinuses). The redness, soreness, and puffiness can cause air and mucus to get trapped in your sinuses. This can allow germs to grow and cause an infection.  HOME CARE   Drink enough fluids to keep your pee (urine) clear or pale yellow.  Use a humidifier in your home.  Run a hot shower to create steam in the bathroom. Sit in the bathroom with the door closed. Breathe in the steam 3 4 times a day.  Put a warm, moist washcloth on your face 3 4 times a day, or as told by your doctor.  Use salt water sprays (saline sprays) to wet the thick fluid in your nose. This can help the sinuses drain.  Only take medicine as told by your doctor. GET HELP RIGHT AWAY IF:   Your pain gets worse.  You have very bad headaches.  You are sick to your stomach (nauseous).  You throw up (vomit).  You are very sleepy (drowsy) all the time.  Your face is puffy (swollen).  Your vision changes.  You have a stiff neck.  You have trouble breathing. MAKE SURE YOU:   Understand these instructions.  Will watch your condition.  Will get help right away if you are not doing well or get worse. Document Released: 06/28/2007 Document Revised: 10/04/2011 Document Reviewed: 08/15/2011 [facility] Patient Information 2014 [place], [place]. Sinusitis Sinusitis is redness, soreness, and puffiness (inflammation) of the air pockets in the bones of your face (sinuses).  The redness, soreness, and puffiness can cause air and mucus to get trapped in your sinuses. This can allow germs to grow and cause an infection.  HOME CARE   Drink enough fluids to keep your pee (urine) clear or pale yellow.  Use a humidifier in your home.  Run a hot shower to create steam in the bathroom. Sit in the bathroom with the door closed. Breathe in the steam 3 4 times a day.  Put a warm, moist washcloth on your face 3 4 times a day, or as told by your doctor.  Use salt water sprays (saline sprays) to wet the thick fluid in your nose. This can help the sinuses drain.  Only take medicine as told by your doctor. GET HELP RIGHT AWAY IF:   Your pain gets worse.  You have very bad headaches.  You are sick to your stomach (nauseous).  You throw up (vomit).  You are very sleepy (drowsy) all the time.  Your face is puffy (swollen).  Your vision changes.  You have a stiff neck.  You have trouble breathing. MAKE SURE YOU:   Understand these instructions.  Will watch your condition.  Will get help right away if you are not doing well or get worse. Document Released: 06/28/2007 Document Revised: 10/04/2011 Document Reviewed: 08/15/2011 [facility] Patient Information 2014 [place], [place].

## 2013-01-06 NOTE — Assessment & Plan Note (Signed)
Has followed with heme for same - last OV reviewed Felt to represent ITP - currently stable

## 2013-01-06 NOTE — Assessment & Plan Note (Signed)
Evaluation by hematology December 2014 review Chronic stable symptoms since bone marrow biopsy 2001 Interval history reviewed, no changes recommended

## 2013-01-06 NOTE — Progress Notes (Signed)
Subjective:    Patient ID: [name], male    DOB: Feb 06, 1927, 79 y.o.   MRN: [id number]  HPI  Here for followup: reviewed chronic medical issues interval medical event  Seen 2 weeks ago by nurse practitioner for sinusitis. Treat with amoxicillin Labs show leukopenia, chronic issue for which patient has followed up with hematology in the interval. Felt to have stable ITP and CMML.  Past Medical History  Diagnosis Date  . Adenomatous colon polyp 03/1993  . Prostate cancer   . Depression   . OSA (obstructive sleep apnea)   . Hearing loss   . Atrial fibrillation     not anticoag candidate due to decond and falls  . Legally blind 1987  . AORTIC STENOSIS     s/p AVR 01/2010  . CLOSTRIDIUM DIFFICILE COLITIS 12/30/2009  . CORONARY ARTERY DISEASE     s/p CABG 01/28/10  . HIP REPLACEMENT, RIGHT, HX OF 2006  . HYPERTROPHY PROSTATE W/UR OBST & OTH LUTS   . MRSA infection 04/2010  . Gout   . THROMBOCYTOPENIA   . Arthritis   . DEGENERATIVE DISC DISEASE   . Diabetes mellitus, type 2   . GERD   . Hyperlipidemia   . Hypertension   . MRSA infection greater than 3 months ago     Review of Systems  Constitutional: Positive for fatigue. Negative for fever, diaphoresis and unexpected weight change.  HENT: Positive for postnasal drip and sinus pressure. Negative for facial swelling and sneezing.   Respiratory: Negative for chest tightness.        Objective:   Physical Exam BP 140/68  Pulse 63  Temp(Src) 97.8 F (36.6 C) (Oral)  Wt 217 lb 12.8 oz (98.793 kg)  SpO2 96% Wt Readings from Last 3 Encounters:  01/06/13 217 lb 12.8 oz (98.793 kg)  12/31/12 219 lb 6.4 oz (99.519 kg)  12/27/12 213 lb 12 oz (96.956 kg)   Constitutional: he appears well-developed and well-nourished. No distress. wife at side HENT: Head: Normocephalic and atraumatic. Moderate tenderness over frontal sinuses bilaterally Ears: B TMs ok, no erythema or effusion; Nose: swollen turbinates with thick yellow  discharge evident. Mouth/Throat: Oropharynx is clear and moist. No oropharyngeal exudate.  Eyes: Conjunctivae and EOM are normal. Pupils are equal, round, and reactive to light. No scleral icterus.  Neck: Normal range of motion. Neck supple. No JVD present. No thyromegaly present.  Cardiovascular: Normal rate, regular rhythm and normal heart sounds.  No murmur heard. No BLE edema. Pulmonary/Chest: Effort normal and breath sounds normal. No respiratory distress. he has no wheezes.  Psychiatric: he has a normal mood and affect. behavior is normal. Judgment and thought content normal.  Lab Results  Component Value Date   WBC 12.6* 12/27/2012   HGB 15.6 12/27/2012   HCT 46.2 12/27/2012   PLT 94.0* 12/27/2012   GLUCOSE 108* 12/27/2012   CHOL 142 09/19/2012   TRIG 186.0* 09/19/2012   HDL 48.00 09/19/2012   LDLDIRECT 79.0 12/04/2011   LDLCALC 57 09/19/2012   ALT 32 12/27/2012   AST 30 12/27/2012   NA 141 12/27/2012   K 4.0 12/27/2012   CL 102 12/27/2012   CREATININE 1.1 12/27/2012   BUN 12 12/27/2012   CO2 30 12/27/2012   TSH 1.78 09/19/2012   PSA 0.21 09/13/2010   INR 1.04 12/05/2010   HGBA1C 5.7 09/19/2012   MICROALBUR 1.2 09/19/2012   Mr Brain Wo Contrast  06/06/2012   *RADIOLOGY REPORT*  Clinical Data: Visual deficits.  Rule out infarct.  Diabetic hypertensive patient with high cholesterol.  MRI HEAD WITHOUT CONTRAST IMPRESSION: No acute infarct.  Remote tiny infarct right cerebellum.  Remote small basal ganglia infarcts.  Moderate small vessel disease type changes.  Atrophy.  Ventricular prominence similar to the prior exam.  Mild hydrocephalus not excluded.  Tiny area of blood breakdown products posterior left temporal lobe, left cerebellum and right thalamus most consistent with result of prior hemorrhagic ischemia.  Mild exophthalmos.  Moderate mucosal thickening nondominant left sphenoid sinus once again noted.  Mild mucosal thickening ethmoid sinus air cells bilaterally.  Mild mucosal thickening left  maxillary sinus.   Original Report Authenticated By: Lacy Duverney, M.D.       Assessment & Plan:   Frontal sinusitis - unimproved with amoxicillin 2 weeks ago. Persisting irritability, frontal sinus pressure and thick discolored discharge. Because of immune compromise, we'll treat with off and antibiotic. Levaquin daily for 7 days. Add nasal steroid. Continue symptomatic care. Patient/wife agree to call symptoms unimproved in next 2 weeks, sooner if worse

## 2013-02-13 ENCOUNTER — Encounter: Payer: Self-pay | Admitting: Internal Medicine

## 2013-02-13 ENCOUNTER — Ambulatory Visit (INDEPENDENT_AMBULATORY_CARE_PROVIDER_SITE_OTHER): Payer: Medicare Other | Admitting: Internal Medicine

## 2013-02-13 VITALS — BP 138/60 | HR 75 | Temp 98.7°F | Wt 217.4 lb

## 2013-02-13 DIAGNOSIS — J3489 Other specified disorders of nose and nasal sinuses: Secondary | ICD-10-CM

## 2013-02-13 DIAGNOSIS — I10 Essential (primary) hypertension: Secondary | ICD-10-CM

## 2013-02-13 DIAGNOSIS — J019 Acute sinusitis, unspecified: Secondary | ICD-10-CM

## 2013-02-13 DIAGNOSIS — E119 Type 2 diabetes mellitus without complications: Secondary | ICD-10-CM

## 2013-02-13 MED ORDER — CEPHALEXIN 500 MG PO CAPS: 500.0000 mg | ORAL_CAPSULE | Freq: Four times a day (QID) | ORAL | Status: DC

## 2013-02-13 NOTE — Telephone Encounter (Signed)
Ok to see nancy if possible

## 2013-02-13 NOTE — Assessment & Plan Note (Signed)
stable overall by history and exam, recent data reviewed with pt, and pt to continue medical treatment as before,  to f/u any worsening symptoms or concerns BP Readings from Last 3 Encounters:  02/13/13 138/60  01/06/13 140/68  12/31/12 146/50

## 2013-02-13 NOTE — Assessment & Plan Note (Signed)
Mild to mod, for antibx course,  to f/u any worsening symptoms or concerns 

## 2013-02-13 NOTE — Progress Notes (Signed)
Subjective:    Patient ID: [name], male    DOB: 27-Apr-1927, 80 y.o.   MRN: [id number]  HPI   Here with 2-3 days acute onset fever, facial pain, pressure, headache, general weakness and malaise, and greenish d/c, with mild ST and cough, but pt denies chest pain, wheezing, increased sob or doe, orthopnea, PND, increased LE swelling, palpitations, dizziness or syncope.  Pt denies new neurological symptoms such as new headache, or facial or extremity weakness or numbness   Pt denies polydipsia, polyuria, Past Medical History  Diagnosis Date  . Adenomatous colon polyp 03/1993  . Prostate cancer   . Depression   . OSA (obstructive sleep apnea)   . Hearing loss     wears hearing aides bilaterally  . Atrial fibrillation     not anticoag candidate due to decond and falls  . Legally blind 1987  . AORTIC STENOSIS     s/p AVR 01/2010  . CLOSTRIDIUM DIFFICILE COLITIS 12/30/2009  . CORONARY ARTERY DISEASE     s/p CABG 01/28/10  . HIP REPLACEMENT, RIGHT, HX OF 2006  . HYPERTROPHY PROSTATE W/UR OBST & OTH LUTS   . MRSA infection 04/2010  . Gout   . THROMBOCYTOPENIA     likely ITP with CMML (bone marrow bx 2011)  . Arthritis   . DEGENERATIVE DISC DISEASE   . Diabetes mellitus, type 2   . GERD   . Hyperlipidemia   . Hypertension   . MRSA infection greater than 3 months ago   . CMML (chronic myelomonocytic leukemia)    Past Surgical History  Procedure Laterality Date  . Coronary artery bypass graft  01/28/2010    x3  . Aortic valve replacement  01/28/2010  . Prostatectomy  1993  . Hernia repair  03/15/2009    left  . Anal sphincterotomy  1996  . Total hip arthroplasty  2006    left  . Cataract extraction, bilateral      4/11 & 5/11  . Cholecystectomy  2011    reports that he quit smoking about 3 years ago. His smoking use included Cigars. He has never used smokeless tobacco. He reports that he does not use illicit drugs. His alcohol history is not on file. family history  includes Alcohol abuse in his father; Aneurysm in his father; Arthritis in his brother and other; Gout in his brother and other; Heart attack in his brother; Heart disease in his mother; Hypertension in his brother; Lung cancer in his mother; Sudden death in his father. Allergies  Allergen Reactions  . Doxycycline     rash  . Septra [Bactrim]     hallucinations  . Morphine Anxiety    REACTION: itching   Current Outpatient Prescriptions on File Prior to Visit  Medication Sig Dispense Refill  . acetaminophen (TYLENOL) 500 MG tablet Take 650 mg by mouth 2 (two) times daily as needed for pain.       Marland Kitchen allopurinol (ZYLOPRIM) 300 MG tablet Take 1 tablet by mouth at  bedtime  90 tablet  3  . Ascorbic Acid (VITAMIN C PO) Take 1 tablet by mouth every morning.        Marland Kitchen CALCIUM PO Take 1 tablet by mouth every morning.        . cetirizine (ZYRTEC ALLERGY) 10 MG tablet Take 10 mg by mouth daily.      . Cholecalciferol (VITAMIN D-3 PO) Take 2,000 Units by mouth daily.        Marland Kitchen  diltiazem (CARDIZEM CD) 180 MG 24 hr capsule Take 1 capsule (180 mg total) by mouth every evening.  90 capsule  3  . docusate sodium (COLACE) 100 MG capsule Take 100 mg by mouth daily.        . DULoxetine (CYMBALTA) 20 MG capsule Take 1 capsule by mouth  daily  90 capsule  3  . fluticasone (FLONASE) 50 MCG/ACT nasal spray Place 1 spray into both nostrils daily.  16 g  2  . furosemide (LASIX) 40 MG tablet Take 0.5 tablets (20 mg total) by mouth daily.  45 tablet  3  . meloxicam (MOBIC) 15 MG tablet Take 1 tablet by mouth  every morning  90 tablet  3  . metoprolol tartrate (LOPRESSOR) 25 MG tablet Take 1 tablet (25 mg total) by mouth 2 (two) times daily.  180 tablet  3  . Multiple Vitamins-Minerals (ICAPS MV PO) Take 1 tablet by mouth every morning.        Marland Kitchen omeprazole (PRILOSEC) 20 MG capsule       . potassium chloride (K-DUR) 10 MEQ tablet Take 1 tablet (10 mEq total) by mouth daily.  90 tablet  3  . Pyridoxine HCl (VITAMIN B-6  PO) Take 1 tablet by mouth every morning.        . ranitidine (ZANTAC) 150 MG tablet Take 0.5 tablets (75 mg total) by mouth every morning.  45 tablet  3  . rOPINIRole (REQUIP) 0.25 MG tablet Take 3 tablets by mouth at  bedtime  270 tablet  3  . rosuvastatin (CRESTOR) 10 MG tablet Take 1 tablet (10 mg total) by mouth at bedtime.  90 tablet  3  . Thiamine HCl (VITAMIN B-1 PO) Take 1 tablet by mouth every morning.         No current facility-administered medications on file prior to visit.   Review of Systems  Constitutional: Negative for unexpected weight change, or unusual diaphoresis  HENT: Negative for tinnitus.   Eyes: Negative for photophobia and visual disturbance.  Respiratory: Negative for choking and stridor.   Gastrointestinal: Negative for vomiting and blood in stool.  Genitourinary: Negative for hematuria and decreased urine volume.  Musculoskeletal: Negative for acute joint swelling Skin: Negative for color change and wound.  Neurological: Negative for tremors and numbness other than noted  Psychiatric/Behavioral: Negative for decreased concentration or  hyperactivity.       Objective:   Physical Exam BP 138/60  Pulse 75  Temp(Src) 98.7 F (37.1 C) (Oral)  Wt 217 lb 6 oz (98.601 kg)  SpO2 98% VS noted, mild ill Constitutional: Pt appears well-developed and well-nourished.  HENT: Head: NCAT.  Right Ear: External ear normal.  Left Ear: External ear normal.  Eyes: Conjunctivae and EOM are normal. Pupils are equal, round, and reactive to light.  Bilat tm's with mild erythema.  Max sinus areas mild tender.  Pharynx with mild erythema, no exudate Neck: Normal range of motion. Neck supple.  Cardiovascular: Normal rate and regular rhythm.   Pulmonary/Chest: Effort normal and breath sounds normal.  Neurological: Pt is alert. Not confused  Skin: Skin is warm. No erythema.  Psychiatric: Pt behavior is normal. Thought content normal.     Assessment & Plan:

## 2013-02-13 NOTE — Assessment & Plan Note (Signed)
stable overall by history and exam, recent data reviewed with pt, and pt to continue medical treatment as before,  to f/u any worsening symptoms or concerns Lab Results  Component Value Date   HGBA1C 5.7 09/19/2012

## 2013-02-13 NOTE — Patient Instructions (Signed)
Please take all new medication as prescribed Please continue all other medications as before, and refills have been done if requested. Please have the pharmacy call with any other refills you may need.  Please remember to sign up for My Chart if you have not done so, as this will be important to you in the future with finding out test results, communicating by private email, and scheduling acute appointments online when needed.   

## 2013-02-13 NOTE — Telephone Encounter (Signed)
MD out of office. Please advise on message...[name]

## 2013-02-18 NOTE — Addendum Note (Signed)
Addended by: Biagio Borg on: 02/18/2013 12:38 PM   Modules accepted: Orders

## 2013-03-25 ENCOUNTER — Ambulatory Visit: Payer: Medicare Other | Admitting: Internal Medicine

## 2013-04-04 ENCOUNTER — Ambulatory Visit (INDEPENDENT_AMBULATORY_CARE_PROVIDER_SITE_OTHER): Payer: Medicare Other | Admitting: Internal Medicine

## 2013-04-04 ENCOUNTER — Other Ambulatory Visit (INDEPENDENT_AMBULATORY_CARE_PROVIDER_SITE_OTHER): Payer: Medicare Other

## 2013-04-04 ENCOUNTER — Encounter: Payer: Self-pay | Admitting: Internal Medicine

## 2013-04-04 VITALS — BP 118/64 | HR 61 | Temp 98.1°F | Wt 222.1 lb

## 2013-04-04 DIAGNOSIS — F329 Major depressive disorder, single episode, unspecified: Secondary | ICD-10-CM

## 2013-04-04 DIAGNOSIS — E119 Type 2 diabetes mellitus without complications: Secondary | ICD-10-CM

## 2013-04-04 DIAGNOSIS — E785 Hyperlipidemia, unspecified: Secondary | ICD-10-CM

## 2013-04-04 DIAGNOSIS — F32A Depression, unspecified: Secondary | ICD-10-CM

## 2013-04-04 DIAGNOSIS — F3289 Other specified depressive episodes: Secondary | ICD-10-CM

## 2013-04-04 LAB — HEMOGLOBIN A1C: HEMOGLOBIN A1C: 5.8 % (ref 4.6–6.5)

## 2013-04-04 MED ORDER — DULOXETINE HCL 30 MG PO CPEP: 30.0000 mg | ORAL_CAPSULE | Freq: Every day | ORAL | Status: DC

## 2013-04-04 NOTE — Patient Instructions (Signed)
It was good to see you today.  We have reviewed your prior records including labs and tests today  Test(s) ordered today. Your results will be released to [place] (or called to you) after review, usually within 72hours after test completion. If any changes need to be made, you will be notified at that same time.  Medications reviewed and updated,  Increase Cymbalta to 30mg  daily - no other changes recommended at this time. Refill on medication(s) as discussed today.  Your prescription(s) have been submitted to your pharmacy. Please take as directed and contact our office if you believe you are having problem(s) with the medication(s).  Please schedule followup in 6 months, call sooner if problems.

## 2013-04-04 NOTE — Progress Notes (Signed)
Pre visit review using our clinic review tool, if applicable. No additional management support is needed unless otherwise documented below in the visit note. 

## 2013-04-04 NOTE — Assessment & Plan Note (Signed)
rx'd statin due to hx CAD Briefly stopped taking crestor 06/2011 - 09/2011 due to leg pain (unchanged with or without med) last lipids at goal - check annually Could consider alternate statin if needed for cost - but to continue crestor for now per family The current medical regimen is effective;  continue present plan and medications.

## 2013-04-04 NOTE — Assessment & Plan Note (Addendum)
Diet controlled Check a1c q45mo and advised attn to weight/diet as ongoing Lab Results  Component Value Date   HGBA1C 5.7 09/19/2012

## 2013-04-04 NOTE — Assessment & Plan Note (Signed)
Chronic symptoms - on lexapro 2000-07/2011 Other med illness "stable" per other specialists Changed to cymbalta 07/2011 - improved but not at goal Wife reports continued motivational fatigue and hypersomnia Increase to 30mg  daily dose now -erx done Wife/pt to monitor and call if problems

## 2013-04-04 NOTE — Progress Notes (Signed)
Subjective:    Patient ID: [name], male    DOB: [birth year], 80 y.o.   MRN: [id number]  HPI  Here for followup: reviewed chronic medical issues interval medical event   Past Medical History  Diagnosis Date  . Adenomatous colon polyp 03/1993  . Prostate cancer   . Depression   . OSA (obstructive sleep apnea)   . Hearing loss     wears hearing aides bilaterally  . Atrial fibrillation     not anticoag candidate due to decond and falls  . Legally blind 1987  . AORTIC STENOSIS     s/p AVR 01/2010  . CLOSTRIDIUM DIFFICILE COLITIS 12/30/2009  . CORONARY ARTERY DISEASE     s/p CABG 01/28/10  . HIP REPLACEMENT, RIGHT, HX OF 2006  . HYPERTROPHY PROSTATE W/UR OBST & OTH LUTS   . MRSA infection 04/2010  . Gout   . THROMBOCYTOPENIA     likely ITP with CMML (bone marrow bx 2011)  . Arthritis   . DEGENERATIVE DISC DISEASE   . Diabetes mellitus, type 2   . GERD   . Hyperlipidemia   . Hypertension   . MRSA infection greater than 3 months ago   . CMML (chronic myelomonocytic leukemia)     Review of Systems  Constitutional: Positive for fatigue. Negative for fever and unexpected weight change.  Respiratory: Negative for cough and shortness of breath.   Cardiovascular: Negative for chest pain, palpitations and leg swelling.  Psychiatric/Behavioral: Positive for dysphoric mood and decreased concentration. Negative for suicidal ideas and sleep disturbance. The patient is not nervous/anxious.        Objective:   Physical Exam BP 118/64  Pulse 61  Temp(Src) 98.1 F (36.7 C) (Oral)  Wt 222 lb 1.9 oz (100.753 kg)  SpO2 98% Wt Readings from Last 3 Encounters:  04/04/13 222 lb 1.9 oz (100.753 kg)  02/13/13 217 lb 6 oz (98.601 kg)  01/06/13 217 lb 12.8 oz (98.793 kg)   Constitutional: he appears well-developed and well-nourished. No distress. wife at side Eyes: Conjunctivae and EOM are normal. Pupils are equal, round, and reactive to light. No scleral icterus.  Neck: Normal  range of motion. Neck supple. No JVD present. No thyromegaly present.  Cardiovascular: Normal rate, regular rhythm and normal heart sounds.  No murmur heard. No BLE edema. Pulmonary/Chest: Effort normal and breath sounds normal. No respiratory distress. he has no wheezes.  Psychiatric: he has a normal mood and affect. behavior is normal. Judgment and thought content normal.  Lab Results  Component Value Date   WBC 12.6* 12/27/2012   HGB 15.6 12/27/2012   HCT 46.2 12/27/2012   PLT 94.0* 12/27/2012   GLUCOSE 108* 12/27/2012   CHOL 142 09/19/2012   TRIG 186.0* 09/19/2012   HDL 48.00 09/19/2012   LDLDIRECT 79.0 12/04/2011   LDLCALC 57 09/19/2012   ALT 32 12/27/2012   AST 30 12/27/2012   NA 141 12/27/2012   K 4.0 12/27/2012   CL 102 12/27/2012   CREATININE 1.1 12/27/2012   BUN 12 12/27/2012   CO2 30 12/27/2012   TSH 1.78 09/19/2012   PSA 0.21 09/13/2010   INR 1.04 12/05/2010   HGBA1C 5.7 09/19/2012   MICROALBUR 1.2 09/19/2012   Mr Brain Wo Contrast  06/06/2012   *RADIOLOGY REPORT*  Clinical Data: Visual deficits.  Rule out infarct.  Diabetic hypertensive patient with high cholesterol.  MRI HEAD WITHOUT CONTRAST IMPRESSION: No acute infarct.  Remote tiny infarct right cerebellum.  Remote small  basal ganglia infarcts.  Moderate small vessel disease type changes.  Atrophy.  Ventricular prominence similar to the prior exam.  Mild hydrocephalus not excluded.  Tiny area of blood breakdown products posterior left temporal lobe, left cerebellum and right thalamus most consistent with result of prior hemorrhagic ischemia.  Mild exophthalmos.  Moderate mucosal thickening nondominant left sphenoid sinus once again noted.  Mild mucosal thickening ethmoid sinus air cells bilaterally.  Mild mucosal thickening left maxillary sinus.   Original Report Authenticated By: Genia Del, M.D.       Assessment & Plan:   Problem List Items Addressed This Visit   Depression - Primary     Chronic symptoms - on lexapro  2000-07/2011 Other med illness "stable" per other specialists Changed to cymbalta 07/2011 - improved but not at goal Wife reports continued motivational fatigue and hypersomnia Increase to 30mg  daily dose now -erx done Wife/pt to monitor and call if problems    Relevant Medications      DULoxetine (CYMBALTA) DR capsule   DIABETES MELLITUS, TYPE II      Diet controlled Check a1c q26mo and advised attn to weight/diet as ongoing Lab Results  Component Value Date   HGBA1C 5.7 09/19/2012      Relevant Orders      Hemoglobin A1c   HYPERLIPIDEMIA     rx'd statin due to hx CAD Briefly stopped taking crestor 06/2011 - 09/2011 due to leg pain (unchanged with or without med) last lipids at goal - check annually Could consider alternate statin if needed for cost - but to continue crestor for now per family The current medical regimen is effective;  continue present plan and medications.

## 2013-04-22 ENCOUNTER — Other Ambulatory Visit: Payer: Self-pay | Admitting: *Deleted

## 2013-04-22 MED ORDER — DULOXETINE HCL 30 MG PO CPEP: 30.0000 mg | ORAL_CAPSULE | Freq: Every day | ORAL | Status: DC

## 2013-04-29 ENCOUNTER — Telehealth: Payer: Self-pay | Admitting: Cardiovascular Disease

## 2013-04-29 NOTE — Telephone Encounter (Signed)
Returned call and pt verified x 2 w/ [place], pt's wife.  Stated she reordered pt's diltiazem CD and it says to take 180 mg capsule daily.  Wanted to make sure that is correct.  Informed last refill was from Dr. Asa Lente in Aug 2014 and last OV w/ Dr. Gwenlyn Found had same instructions.  Reviewed last OV note before Epic and confirmed pt was taking 180 mg daily.  Wife informed and verbalized understanding.

## 2013-04-29 NOTE — Telephone Encounter (Signed)
Please call question about his Diltiazem CD.

## 2013-06-17 ENCOUNTER — Telehealth: Payer: Self-pay | Admitting: Hematology and Oncology

## 2013-06-17 NOTE — Telephone Encounter (Signed)
pt called to r/s appt..done...pt ok adn aware °

## 2013-06-19 ENCOUNTER — Telehealth: Payer: Self-pay | Admitting: Pulmonary Disease

## 2013-06-19 NOTE — Telephone Encounter (Signed)
Attempted to call pt to set up appt with RA.  No machine and no answer.  Will try back later.

## 2013-06-19 NOTE — Telephone Encounter (Signed)
ok 

## 2013-06-19 NOTE — Telephone Encounter (Signed)
RA pt is needing an appt for a face to face per [facility] to get a new VPAP per medicare guidelines.  Can we add pt on for 6-1 at 4pm?

## 2013-06-23 ENCOUNTER — Encounter: Payer: Self-pay | Admitting: Pulmonary Disease

## 2013-06-23 ENCOUNTER — Ambulatory Visit (INDEPENDENT_AMBULATORY_CARE_PROVIDER_SITE_OTHER): Payer: Medicare Other | Admitting: Pulmonary Disease

## 2013-06-23 VITALS — BP 138/78 | HR 61 | Temp 97.5°F | Ht 70.0 in | Wt 221.8 lb

## 2013-06-23 DIAGNOSIS — G4733 Obstructive sleep apnea (adult) (pediatric): Secondary | ICD-10-CM

## 2013-06-23 NOTE — Assessment & Plan Note (Addendum)
We will try to get you a new biPAP machine - for autobiPAP  Rx sent to [facility]  chk download in 22month  Weight loss encouraged, compliance with goal of at least 4-6 hrs every night is the expectation. Advised against medications with sedative side effects Cautioned against driving when sleepy - understanding that sleepiness will vary on a day to day basis

## 2013-06-23 NOTE — Progress Notes (Signed)
   Subjective:    Patient ID: [name], male    DOB: Jun 25, 1927, 80 y.o.   MRN: [id number]  HPI   86/M for FU of obstructive sleep apnea & excessive somnolence / restless legs  Oct 2011 >> staph sepsis  Nov 2011 >> c .diff colitis, low plts  Underwent CABG x3 Pig AVR in jan '12, in cardiac rehab  H/o Monocytosis, thrombocytopenia - ? Idiopathic, neg BM biopsy ([place])  He had remote sleep study in [place], another Study in 2005  On VPAP auto , mirage full face mask, pr increased to 12 cm in jan'12 after download check  Download 9/17-05/02/10 shows avg pr 15 cm, AHI 14.4 residual with predominant hypopneas, some leak & excellent compliance >> Pr increased to 17 cm  Reviewed ONO on CPAP >> no desaturation , Does not need O2 Download 8/2 -09/07/10 showed avg pressure 11 cm, avg usage 5 h, minimal leak  09/18/2011 VPAP auto download shows residual AHI 9/h, leak ++, excellent usage on 17 cm (14 on expiration)   06/23/2013   Called stating patients VPAP is stuck in EPAP mode and patient has over 21,000 hours on machine. He has Medicare and therefore will need face to face with RA before getting new VPAP  Chief Complaint  Patient presents with  . Follow-up    Pt needs new VPAP machine. He wears current machine everynight. Pt is sleeping all the time. Pt has a loaner machine and when the pressure hits 17 his mouth seems to open. Pt reports even when wearing machine all night he still feels tired.    Download on 17 cm, no residuals, does not document usage accurately Pr ok,mask ok, no leak bedtime 10p, minimal latency, Nocturia + since prostate sx, oob at 0930 , feels refreshed , no headaches, daytime naps +   Review of Systems Pt denies any significant  nasal congestion or excess secretions, fever, chills, sweats, unintended wt loss, pleuritic or exertional cp, orthopnea pnd or leg swelling.  Pt also denies any obvious fluctuation in symptoms with weather or environmental change or  other alleviating or aggravating factors.    Pt denies any increase in rescue therapy over baseline, denies waking up needing it or having early am exacerbations or coughing/wheezing/ or dyspnea      Objective:   Physical Exam  Gen. Pleasant, well-nourished, in no distress ENT - no lesions, no post nasal drip Neck: No JVD, no thyromegaly, no carotid bruits Lungs: no use of accessory muscles, no dullness to percussion, clear without rales or rhonchi  Cardiovascular: Rhythm regular, heart sounds  normal, no murmurs or gallops, no peripheral edema Musculoskeletal: No deformities, no cyanosis or clubbing        Assessment & Plan:

## 2013-06-23 NOTE — Telephone Encounter (Signed)
appt set for today at [place], [place]

## 2013-06-23 NOTE — Patient Instructions (Signed)
We will try to get you a new CPAP machine Rx sent to [facility]

## 2013-06-24 ENCOUNTER — Telehealth: Payer: Self-pay | Admitting: Pulmonary Disease

## 2013-06-24 NOTE — Telephone Encounter (Signed)
According to order staff message sent to [facility] to get order. I have also sent another. Called spouse and made her aware will ensure [facility] gets order today. Nothing further needed

## 2013-06-26 ENCOUNTER — Telehealth: Payer: Self-pay | Admitting: Pulmonary Disease

## 2013-06-26 DIAGNOSIS — G4733 Obstructive sleep apnea (adult) (pediatric): Secondary | ICD-10-CM

## 2013-06-26 NOTE — Telephone Encounter (Signed)
I called and spoke w/ Melissa from [facility]. Pt has bipap machine. We sent order for auto CPAP. Need setting for auto bipap. RA not in until next week. I called pt to make aware and NA and no VM WCB Please advise RA thanks

## 2013-06-27 ENCOUNTER — Ambulatory Visit (HOSPITAL_COMMUNITY): Payer: Medicare Other

## 2013-06-27 NOTE — Telephone Encounter (Signed)
Called, spoke with pt's wife.   Explained below to her. She verbalized understanding and ok with this. RA, pls advise.  Thank you.

## 2013-06-29 NOTE — Telephone Encounter (Signed)
Ok to give order for autobipap -download in 53month

## 2013-06-30 ENCOUNTER — Encounter: Payer: Self-pay | Admitting: *Deleted

## 2013-06-30 NOTE — Telephone Encounter (Signed)
Order has been placed and staff message sent to [facility]. Nothing further needed

## 2013-06-30 NOTE — Telephone Encounter (Signed)
IPAP 10-20 EPAP 5-15 

## 2013-06-30 NOTE — Telephone Encounter (Signed)
[facility] is requesting auto bipap # range on the order. Please advise thanks

## 2013-07-01 ENCOUNTER — Ambulatory Visit (HOSPITAL_COMMUNITY)
Admission: RE | Admit: 2013-07-01 | Discharge: 2013-07-01 | Disposition: A | Discharge status: Home / Self Care | Payer: Medicare Other | Source: Ambulatory Visit | Attending: Cardiovascular Disease | Admitting: Cardiovascular Disease

## 2013-07-01 ENCOUNTER — Telehealth: Payer: Self-pay | Admitting: Hematology and Oncology

## 2013-07-01 ENCOUNTER — Other Ambulatory Visit (HOSPITAL_BASED_OUTPATIENT_CLINIC_OR_DEPARTMENT_OTHER): Payer: Medicare Other

## 2013-07-01 ENCOUNTER — Ambulatory Visit (HOSPITAL_BASED_OUTPATIENT_CLINIC_OR_DEPARTMENT_OTHER): Payer: Medicare Other | Admitting: Hematology and Oncology

## 2013-07-01 ENCOUNTER — Telehealth: Payer: Self-pay | Admitting: *Deleted

## 2013-07-01 VITALS — BP 152/48 | Temp 98.2°F

## 2013-07-01 DIAGNOSIS — D693 Immune thrombocytopenic purpura: Secondary | ICD-10-CM

## 2013-07-01 DIAGNOSIS — D696 Thrombocytopenia, unspecified: Secondary | ICD-10-CM

## 2013-07-01 DIAGNOSIS — R1310 Dysphagia, unspecified: Secondary | ICD-10-CM | POA: Insufficient documentation

## 2013-07-01 DIAGNOSIS — Z954 Presence of other heart-valve replacement: Secondary | ICD-10-CM | POA: Insufficient documentation

## 2013-07-01 DIAGNOSIS — C9310 Chronic myelomonocytic leukemia not having achieved remission: Secondary | ICD-10-CM

## 2013-07-01 DIAGNOSIS — I359 Nonrheumatic aortic valve disorder, unspecified: Secondary | ICD-10-CM

## 2013-07-01 DIAGNOSIS — Z952 Presence of prosthetic heart valve: Secondary | ICD-10-CM

## 2013-07-01 DIAGNOSIS — I369 Nonrheumatic tricuspid valve disorder, unspecified: Secondary | ICD-10-CM

## 2013-07-01 DIAGNOSIS — C9210 Chronic myeloid leukemia, BCR/ABL-positive, not having achieved remission: Secondary | ICD-10-CM

## 2013-07-01 LAB — CBC WITH DIFFERENTIAL/PLATELET
BASO%: 0.3 % (ref 0.0–2.0)
BASOS ABS: 0 10*3/uL (ref 0.0–0.1)
EOS%: 0.8 % (ref 0.0–7.0)
Eosinophils Absolute: 0 10*3/uL (ref 0.0–0.5)
HEMATOCRIT: 42.8 % (ref 38.4–49.9)
HEMOGLOBIN: 14.3 g/dL (ref 13.0–17.1)
LYMPH%: 17.8 % (ref 14.0–49.0)
MCH: 31.4 pg (ref 27.2–33.4)
MCHC: 33.5 g/dL (ref 32.0–36.0)
MCV: 93.8 fL (ref 79.3–98.0)
MONO#: 1.8 10*3/uL — ABNORMAL HIGH (ref 0.1–0.9)
MONO%: 38.1 % — AB (ref 0.0–14.0)
NEUT#: 2 10*3/uL (ref 1.5–6.5)
NEUT%: 43 % (ref 39.0–75.0)
Platelets: 67 10*3/uL — ABNORMAL LOW (ref 140–400)
RBC: 4.57 10*6/uL (ref 4.20–5.82)
RDW: 14.3 % (ref 11.0–14.6)
WBC: 4.6 10*3/uL (ref 4.0–10.3)
lymph#: 0.8 10*3/uL — ABNORMAL LOW (ref 0.9–3.3)

## 2013-07-01 NOTE — Progress Notes (Signed)
[facility] OFFICE PROGRESS NOTE  Patient Care Team: Rowe Clack, MD as PCP - General (Internal Medicine) Lorretta Harp, MD (Cardiology) Bonnita Levan, MD (Dermatology) Michel Bickers, MD as Consulting Physician (Infectious Diseases) Rigoberto Noel, MD as Consulting Physician (Pulmonary Disease) Myrlene Broker, MD as Consulting Physician (Urology) Ladene Artist, MD as Consulting Physician (Gastroenterology) Deliah Goody, MD as Consulting Physician (Ophthalmology) Heath Lark, MD as Consulting Physician (Hematology and Oncology)  SUMMARY OF ONCOLOGIC HISTORY: This is a pleasant 80 year old gentleman with chronic monocytosis and thrombocytopenia. Bone marrow biopsy from 2011 was abnormal, suspect probable CMML and ITP. He is observed.  INTERVAL HISTORY: Please see below for problem oriented charting. He has easy bruising. The patient denies any recent signs or symptoms of bleeding such as spontaneous epistaxis, hematuria or hematochezia. His other new complaints included recent sinus infection and coughing spells with liquids.  REVIEW OF SYSTEMS:   Constitutional: Denies fevers, chills or abnormal weight loss Eyes: Denies blurriness of vision Ears, nose, mouth, throat, and face: Denies mucositis or sore throat Respiratory: Denies cough, dyspnea or wheezes Cardiovascular: Denies palpitation, chest discomfort or lower extremity swelling Gastrointestinal:  Denies nausea, heartburn or change in bowel habits Skin: Denies abnormal skin rashes Lymphatics: Denies new lymphadenopathy  Neurological:Denies numbness, tingling or new weaknesses Behavioral/Psych: Mood is stable, no new changes  All other systems were reviewed with the patient and are negative.  I have reviewed the past medical history, past surgical history, social history and family history with the patient and they are unchanged from previous note.  ALLERGIES:  is allergic to doxycycline;  septra; and morphine.  MEDICATIONS:  Current Outpatient Prescriptions  Medication Sig Dispense Refill  . allopurinol (ZYLOPRIM) 300 MG tablet Take 1 tablet by mouth at  bedtime  90 tablet  3  . Ascorbic Acid (VITAMIN C PO) Take 1 tablet by mouth every morning.        Marland Kitchen CALCIUM PO Take 1 tablet by mouth every morning.        . cetirizine (ZYRTEC ALLERGY) 10 MG tablet Take 10 mg by mouth daily.      . Cholecalciferol (VITAMIN D-3 PO) Take 2,000 Units by mouth daily.        Marland Kitchen diltiazem (CARDIZEM CD) 180 MG 24 hr capsule Take 1 capsule (180 mg total) by mouth every evening.  90 capsule  3  . docusate sodium (COLACE) 100 MG capsule Take 100 mg by mouth daily.        . DULoxetine (CYMBALTA) 30 MG capsule Take 1 capsule (30 mg total) by mouth daily.  90 capsule  3  . furosemide (LASIX) 40 MG tablet Take 0.5 tablets (20 mg total) by mouth daily.  45 tablet  3  . metoprolol tartrate (LOPRESSOR) 25 MG tablet Take 1 tablet (25 mg total) by mouth 2 (two) times daily.  180 tablet  3  . Multiple Vitamins-Minerals (ICAPS MV PO) Take 1 tablet by mouth every morning.        Marland Kitchen omeprazole (PRILOSEC) 20 MG capsule       . potassium chloride (K-DUR) 10 MEQ tablet Take 1 tablet (10 mEq total) by mouth daily.  90 tablet  3  . Pyridoxine HCl (VITAMIN B-6 PO) Take 1 tablet by mouth every morning.        . ranitidine (ZANTAC) 150 MG tablet Take 0.5 tablets (75 mg total) by mouth every morning.  45 tablet  3  . rOPINIRole (REQUIP)  0.25 MG tablet Take 3 tablets by mouth at  bedtime  270 tablet  3  . rosuvastatin (CRESTOR) 10 MG tablet Take 1 tablet (10 mg total) by mouth at bedtime.  90 tablet  3  . solifenacin (VESICARE) 5 MG tablet Take 5 mg by mouth daily.      . Thiamine HCl (VITAMIN B-1 PO) Take 1 tablet by mouth every morning.        Marland Kitchen acetaminophen (TYLENOL) 500 MG tablet Take 650 mg by mouth 2 (two) times daily as needed for pain.        No current facility-administered medications for this visit.     PHYSICAL EXAMINATION: ECOG PERFORMANCE STATUS: 2 - Symptomatic, <50% confined to bed  Filed Vitals:   07/01/13 1123  BP: 152/48  Temp: 98.2 F (36.8 C)   There were no vitals filed for this visit.  GENERAL:alert, no distress and comfortable SKIN: skin color, texture, turgor are normal, no rashes or significant lesions. Noted skin bruising EYES: normal, Conjunctiva are pink and non-injected, sclera clear OROPHARYNX:no exudate, no erythema and lips, buccal mucosa, and tongue normal  NECK: supple, thyroid normal size, non-tender, without nodularity LYMPH:  no palpable lymphadenopathy in the cervical, axillary or inguinal LUNGS: clear to auscultation and percussion with normal breathing effort HEART: regular rate & rhythm and no murmurs and no lower extremity edema ABDOMEN:abdomen soft, non-tender and normal bowel sounds Musculoskeletal:no cyanosis of digits and no clubbing  NEURO: alert & oriented x 3 with fluent speech, no focal motor/sensory deficits  LABORATORY DATA:  I have reviewed the data as listed    Component Value Date/Time   NA 141 12/27/2012 1304   K 4.0 12/27/2012 1304   CL 102 12/27/2012 1304   CO2 30 12/27/2012 1304   GLUCOSE 108* 12/27/2012 1304   BUN 12 12/27/2012 1304   CREATININE 1.1 12/27/2012 1304   CALCIUM 9.7 12/27/2012 1304   PROT 7.5 12/27/2012 1304   ALBUMIN 4.7 12/27/2012 1304   AST 30 12/27/2012 1304   ALT 32 12/27/2012 1304   ALKPHOS 82 12/27/2012 1304   BILITOT 1.0 12/27/2012 1304   GFRNONAA 77* 12/05/2010 0833   GFRAA 89* 12/05/2010 0833    No results found for this basename: SPEP,  UPEP,   kappa and lambda light chains    Lab Results  Component Value Date   WBC 4.6 07/01/2013   NEUTROABS 2.0 07/01/2013   HGB 14.3 07/01/2013   HCT 42.8 07/01/2013   MCV 93.8 07/01/2013   PLT 67* 07/01/2013      Chemistry      Component Value Date/Time   NA 141 12/27/2012 1304   K 4.0 12/27/2012 1304   CL 102 12/27/2012 1304   CO2 30 12/27/2012 1304   BUN 12  12/27/2012 1304   CREATININE 1.1 12/27/2012 1304      Component Value Date/Time   CALCIUM 9.7 12/27/2012 1304   ALKPHOS 82 12/27/2012 1304   AST 30 12/27/2012 1304   ALT 32 12/27/2012 1304   BILITOT 1.0 12/27/2012 1304      ASSESSMENT & PLAN:  CMML (chronic myelomonocytic leukemia) His platelet count is slightly worse. Overall he is not symptomatic but I recommend very close observation. He just recovered from recent sinus infection and certainly that could depress his bone marrow and cause worsening thrombocytopenia. I plan to see him back in a month and if his platelet count continued to get worse, he will need repeat bone marrow aspirate and biopsy and  possible chemotherapy treatment.  Dysphagia Cause is unknown. I am concerned about risk of aspiration. He has coughing spells with liquids. I recommended he contact his primary care provider to arrange for swallow evaluation.    Orders Placed This Encounter  Procedures  . CBC with Differential    Standing Status: Future     Number of Occurrences:      Standing Expiration Date: 07/01/2014  . Lactate dehydrogenase    Standing Status: Future     Number of Occurrences:      Standing Expiration Date: 07/01/2014  . Uric Acid    Standing Status: Future     Number of Occurrences:      Standing Expiration Date: 07/01/2014  . Comprehensive metabolic panel    Standing Status: Future     Number of Occurrences:      Standing Expiration Date: 07/01/2014   All questions were answered. The patient knows to call the clinic with any problems, questions or concerns. No barriers to learning was detected. I spent 25 minutes counseling the patient face to face. The total time spent in the appointment was 30 minutes and more than 50% was on counseling and review of test results     Heath Lark, MD 07/01/2013 10:36 PM

## 2013-07-01 NOTE — Telephone Encounter (Signed)
Gave pt6 appt for lab and MD on July 2015

## 2013-07-01 NOTE — Progress Notes (Signed)
2D Echocardiogram Complete.  07/01/2013   [name] [place], [name]

## 2013-07-01 NOTE — Telephone Encounter (Signed)
[name] came by [name] office and filled out a form to let [name] know [name] was seen by [name] today and is worried he may have leukemia and wants to do a bone marrow biopsy right away.  They told [name] they want to talk with [name] before they moved ahead with [name] biopsy.  Left message for [name]' to call and let me know if they want an appointment. He had a biopsy 11/2009 prior to his "heart surgery" and [name] labs are no different now.  So - they want to talk with [name] and his PCP prior to 7.6.15  They do want an appt but it needs to be before 7.6.15 before he sees [name] again.  Scheduling was unable to to schedule due to no openings so they sent [name] message to triage.  Will send to [facility] and [name] for review and advise.

## 2013-07-01 NOTE — Assessment & Plan Note (Signed)
Cause is unknown. I am concerned about risk of aspiration. He has coughing spells with liquids. I recommended he contact his primary care provider to arrange for swallow evaluation.

## 2013-07-01 NOTE — Assessment & Plan Note (Signed)
His platelet count is slightly worse. Overall he is not symptomatic but I recommend very close observation. He just recovered from recent sinus infection and certainly that could depress his bone marrow and cause worsening thrombocytopenia. I plan to see him back in a month and if his platelet count continued to get worse, he will need repeat bone marrow aspirate and biopsy and possible chemotherapy treatment.

## 2013-07-02 ENCOUNTER — Ambulatory Visit: Payer: Medicare Other | Admitting: Hematology and Oncology

## 2013-07-02 ENCOUNTER — Other Ambulatory Visit: Payer: Medicare Other

## 2013-07-03 ENCOUNTER — Other Ambulatory Visit: Payer: Self-pay | Admitting: *Deleted

## 2013-07-03 DIAGNOSIS — Z952 Presence of prosthetic heart valve: Secondary | ICD-10-CM

## 2013-07-03 NOTE — Telephone Encounter (Signed)
Lmom.  Dr Gwenlyn Found doesn't have an office visit available before July 6.  I will put him on a cancellation list.  Also, when he calls back I will give him an appt for Dr Kennon Holter next available.

## 2013-07-04 ENCOUNTER — Telehealth: Payer: Self-pay | Admitting: Cardiovascular Disease

## 2013-07-04 NOTE — Telephone Encounter (Signed)
I spoke with Ms Salinger and gave Mr Givler an appt for July 22.  I will have him put on the cancellation list if I get a sooner appt.

## 2013-07-04 NOTE — Telephone Encounter (Signed)
Mrs. Corkins states that she is returning a call from [place].

## 2013-07-04 NOTE — Telephone Encounter (Signed)
result

## 2013-07-05 NOTE — Telephone Encounter (Signed)
Closed encounter °

## 2013-07-07 ENCOUNTER — Encounter: Payer: Self-pay | Admitting: Internal Medicine

## 2013-07-10 ENCOUNTER — Encounter: Payer: Self-pay | Admitting: Internal Medicine

## 2013-07-10 ENCOUNTER — Other Ambulatory Visit (INDEPENDENT_AMBULATORY_CARE_PROVIDER_SITE_OTHER): Payer: Medicare Other

## 2013-07-10 ENCOUNTER — Ambulatory Visit (INDEPENDENT_AMBULATORY_CARE_PROVIDER_SITE_OTHER): Payer: Medicare Other | Admitting: Internal Medicine

## 2013-07-10 VITALS — BP 152/70 | HR 62 | Temp 98.2°F | Wt 218.4 lb

## 2013-07-10 DIAGNOSIS — C9210 Chronic myeloid leukemia, BCR/ABL-positive, not having achieved remission: Secondary | ICD-10-CM

## 2013-07-10 DIAGNOSIS — F09 Unspecified mental disorder due to known physiological condition: Secondary | ICD-10-CM

## 2013-07-10 DIAGNOSIS — R4189 Other symptoms and signs involving cognitive functions and awareness: Secondary | ICD-10-CM

## 2013-07-10 DIAGNOSIS — F329 Major depressive disorder, single episode, unspecified: Secondary | ICD-10-CM

## 2013-07-10 DIAGNOSIS — F32A Depression, unspecified: Secondary | ICD-10-CM

## 2013-07-10 DIAGNOSIS — F3289 Other specified depressive episodes: Secondary | ICD-10-CM

## 2013-07-10 DIAGNOSIS — C9310 Chronic myelomonocytic leukemia not having achieved remission: Secondary | ICD-10-CM

## 2013-07-10 LAB — CBC WITH DIFFERENTIAL/PLATELET
Basophils Absolute: 0 10*3/uL (ref 0.0–0.1)
Basophils Relative: 0.3 % (ref 0.0–3.0)
EOS PCT: 1.1 % (ref 0.0–5.0)
Eosinophils Absolute: 0.1 10*3/uL (ref 0.0–0.7)
HCT: 43.9 % (ref 39.0–52.0)
HEMOGLOBIN: 14.8 g/dL (ref 13.0–17.0)
LYMPHS ABS: 1 10*3/uL (ref 0.7–4.0)
Lymphocytes Relative: 22.1 % (ref 12.0–46.0)
MCHC: 33.7 g/dL (ref 30.0–36.0)
MCV: 93.5 fl (ref 78.0–100.0)
Monocytes Absolute: 1.8 10*3/uL — ABNORMAL HIGH (ref 0.1–1.0)
Neutro Abs: 1.8 10*3/uL (ref 1.4–7.7)
Neutrophils Relative %: 37.2 % — ABNORMAL LOW (ref 43.0–77.0)
PLATELETS: 99 10*3/uL — AB (ref 150.0–400.0)
RBC: 4.7 Mil/uL (ref 4.22–5.81)
RDW: 14.1 % (ref 11.5–15.5)
WBC: 4.7 10*3/uL (ref 4.0–10.5)

## 2013-07-10 LAB — BASIC METABOLIC PANEL
BUN: 15 mg/dL (ref 6–23)
CO2: 33 mEq/L — ABNORMAL HIGH (ref 19–32)
Calcium: 10.1 mg/dL (ref 8.4–10.5)
Chloride: 101 mEq/L (ref 96–112)
Creatinine, Ser: 1.1 mg/dL (ref 0.4–1.5)
GFR: 65.7 mL/min (ref >60.00)
Glucose, Bld: 101 mg/dL — ABNORMAL HIGH (ref 70–99)
POTASSIUM: 4.2 meq/L (ref 3.5–5.1)
SODIUM: 140 meq/L (ref 135–145)

## 2013-07-10 LAB — VITAMIN B12: Vitamin B-12: 1277 pg/mL — ABNORMAL HIGH (ref 211–911)

## 2013-07-10 LAB — HEPATIC FUNCTION PANEL
ALT: 21 U/L (ref 0–53)
AST: 23 U/L (ref 0–37)
Albumin: 4.7 g/dL (ref 3.5–5.2)
Alkaline Phosphatase: 79 U/L (ref 39–117)
BILIRUBIN TOTAL: 1 mg/dL (ref 0.2–1.2)
Bilirubin, Direct: 0.2 mg/dL (ref 0.0–0.3)
Total Protein: 7.1 g/dL (ref 6.0–8.3)

## 2013-07-10 LAB — TSH: TSH: 1.79 u[IU]/mL (ref 0.35–4.50)

## 2013-07-10 MED ORDER — DULOXETINE HCL 60 MG PO CPEP: 60.0000 mg | ORAL_CAPSULE | Freq: Every day | ORAL | Status: DC

## 2013-07-10 NOTE — Progress Notes (Signed)
Pre visit review using our clinic review tool, if applicable. No additional management support is needed unless otherwise documented below in the visit note. 

## 2013-07-10 NOTE — Patient Instructions (Signed)
It was good to see you today.  We have reviewed your prior records including labs and tests today  Test(s) ordered today. Your results will be released to [place] (or called to you) after review, usually within 72hours after test completion. If any changes need to be made, you will be notified at that same time.  Medications reviewed and updated Increase Cymbalta to 50 mg daily (until 20 mg capsules gone), then increase to 60 mg daily Increase MiraLAX to twice daily schedule in addition to ongoing stool softener No other changes recommended at this time. Your prescription(s) have been submitted to your pharmacy. Please take as directed and contact our office if you believe you are having problem(s) with the medication(s).  Please schedule followup in 6-8 weeks, call sooner if problems.

## 2013-07-10 NOTE — Assessment & Plan Note (Signed)
Chronic symptoms - on lexapro 2000-07/2011 Other med illness "stable" per other specialists Changed to cymbalta 07/2011 - improved but not at goal Wife reports continued motivational fatigue and hypersomnia Increased to 30mg  daily 03/2013 - continue titration now -erx done for 60mg  qd Reviewed possible overlap with dementia - see workup for same Wife/pt to monitor and call if problems follow up in 6 weeks on same

## 2013-07-10 NOTE — Progress Notes (Signed)
Subjective:    Patient ID: [name], male    DOB: 21-Feb-1927, 80 y.o.   MRN: [id number]  HPI  Patient here for follow up Reviewed chronic medical issues and interval medical events  Past Medical History  Diagnosis Date  . Adenomatous colon polyp 03/1993  . Prostate cancer   . Depression   . OSA (obstructive sleep apnea)   . Hearing loss     wears hearing aides bilaterally  . Atrial fibrillation     not anticoag candidate due to decond and falls  . Legally blind 1987  . AORTIC STENOSIS     s/p AVR 01/2010  . CLOSTRIDIUM DIFFICILE COLITIS 12/30/2009  . CORONARY ARTERY DISEASE     s/p CABG 01/28/10  . HIP REPLACEMENT, RIGHT, HX OF 2006  . HYPERTROPHY PROSTATE W/UR OBST & OTH LUTS   . MRSA infection 04/2010  . Gout   . THROMBOCYTOPENIA     likely ITP with CMML (bone marrow bx 2011)  . Arthritis   . DEGENERATIVE DISC DISEASE   . Diabetes mellitus, type 2   . GERD   . Hyperlipidemia   . Hypertension   . MRSA infection greater than 3 months ago   . CMML (chronic myelomonocytic leukemia)     Review of Systems  Gastrointestinal: Positive for constipation (chronic, progressive). Negative for blood in stool.  Endocrine: Positive for cold intolerance.  Neurological: Positive for weakness (gradually progressive).  Psychiatric/Behavioral: Positive for decreased concentration and agitation ("irritable").       Objective:   Physical Exam  BP 152/70  Pulse 62  Temp(Src) 98.2 F (36.8 C) (Oral)  Wt 218 lb 6.4 oz (99.066 kg)  SpO2 96% Wt Readings from Last 3 Encounters:  07/10/13 218 lb 6.4 oz (99.066 kg)  06/23/13 221 lb 12.8 oz (100.608 kg)  04/04/13 222 lb 1.9 oz (100.753 kg)   Constitutional: he appears well-developed and well-nourished. No distress. wife at side Eyes: Conjunctivae and EOM are normal. Pupils are equal, round, and reactive to light. No scleral icterus.  Neck: Normal range of motion. Neck supple. No JVD present. No thyromegaly present.    Cardiovascular: Normal rate, regular rhythm and normal heart sounds.  No murmur heard. No BLE edema. Pulmonary/Chest: Effort normal and breath sounds normal. No respiratory distress. he has no wheezes.  Neuro: awake, alert, oriented x3. Cranial nerves II through XII symmetrically intact. Nonparticipation in recall questions, looks to wife for assistance, ques and prompting Psychiatric: he has a reserved, but pleasant mood and affect. behavior is normal. Judgment and thought content normal.  Lab Results  Component Value Date   WBC 4.6 07/01/2013   HGB 14.3 07/01/2013   HCT 42.8 07/01/2013   PLT 67* 07/01/2013   GLUCOSE 108* 12/27/2012   CHOL 142 09/19/2012   TRIG 186.0* 09/19/2012   HDL 48.00 09/19/2012   LDLDIRECT 79.0 12/04/2011   LDLCALC 57 09/19/2012   ALT 32 12/27/2012   AST 30 12/27/2012   NA 141 12/27/2012   K 4.0 12/27/2012   CL 102 12/27/2012   CREATININE 1.1 12/27/2012   BUN 12 12/27/2012   CO2 30 12/27/2012   TSH 1.78 09/19/2012   PSA 0.21 09/13/2010   INR 1.04 12/05/2010   HGBA1C 5.8 04/04/2013   MICROALBUR 1.2 09/19/2012    No results found.     Assessment & Plan:   constipation, chronic. Advised MiraLAX twice a day in addition to attention to increase fluid intake. Reviewed last colonoscopy 2010 -  Problem List Items Addressed This Visit   CMML (chronic myelomonocytic leukemia)     Recent evaluation by hematology reviewed Chronic stable symptoms and CBC since bone marrow biopsy 2001 Interval history reviewed, no changes recommended at this time follow up serial CBC as planned for observation only given other comorbid dz    Relevant Orders      CBC with Differential (Completed)   Cognitive decline     reviewed with spouse and patient generalized slowing and decline, withdrawal from an active her physical activities and increasing irritability when redirected. Discussed types of dementia and natural course of same. Prior to seeking urologic intervention or input, continue to  maximize therapy for overlapping depression -see above. followup 6 weeks, support offered at length    Relevant Orders      Basic metabolic panel (Completed)      CBC with Differential (Completed)      Hepatic function panel (Completed)      TSH (Completed)      Vitamin B12 (Completed)   Depression - Primary     Chronic symptoms - on lexapro 2000-07/2011 Other med illness "stable" per other specialists Changed to cymbalta 07/2011 - improved but not at goal Wife reports continued motivational fatigue and hypersomnia Increased to $RemoveBefo'30mg'NdnZtkueNEb$  daily 03/2013 - continue titration now -erx done for $Remove'60mg'CIXeoJw$  qd Reviewed possible overlap with dementia - see workup for same Wife/pt to monitor and call if problems follow up in 6 weeks on same    Relevant Medications      DULoxetine (CYMBALTA) DR capsule   Other Relevant Orders      Basic metabolic panel (Completed)      CBC with Differential (Completed)      Hepatic function panel (Completed)      TSH (Completed)      Vitamin B12 (Completed)

## 2013-07-10 NOTE — Assessment & Plan Note (Signed)
Recent evaluation by hematology reviewed Chronic stable symptoms and CBC since bone marrow biopsy 2001 Interval history reviewed, no changes recommended at this time follow up serial CBC as planned for observation only given other comorbid dz

## 2013-07-11 DIAGNOSIS — R4189 Other symptoms and signs involving cognitive functions and awareness: Secondary | ICD-10-CM | POA: Insufficient documentation

## 2013-07-11 NOTE — Assessment & Plan Note (Signed)
reviewed with spouse and patient generalized slowing and decline, withdrawal from an active her physical activities and increasing irritability when redirected. Discussed types of dementia and natural course of same. Prior to seeking urologic intervention or input, continue to maximize therapy for overlapping depression -see above. followup 6 weeks, support offered at length

## 2013-07-15 ENCOUNTER — Other Ambulatory Visit: Payer: Self-pay | Admitting: Dermatology

## 2013-07-23 ENCOUNTER — Encounter: Payer: Self-pay | Admitting: Cardiovascular Disease

## 2013-07-23 ENCOUNTER — Ambulatory Visit (INDEPENDENT_AMBULATORY_CARE_PROVIDER_SITE_OTHER): Payer: Medicare Other | Admitting: Cardiovascular Disease

## 2013-07-23 VITALS — BP 138/62 | HR 62 | Ht 70.0 in | Wt 219.0 lb

## 2013-07-23 DIAGNOSIS — I10 Essential (primary) hypertension: Secondary | ICD-10-CM

## 2013-07-23 DIAGNOSIS — Z951 Presence of aortocoronary bypass graft: Secondary | ICD-10-CM

## 2013-07-23 DIAGNOSIS — E785 Hyperlipidemia, unspecified: Secondary | ICD-10-CM

## 2013-07-23 DIAGNOSIS — I2510 Atherosclerotic heart disease of native coronary artery without angina pectoris: Secondary | ICD-10-CM

## 2013-07-23 DIAGNOSIS — I4891 Unspecified atrial fibrillation: Secondary | ICD-10-CM

## 2013-07-23 NOTE — Patient Instructions (Signed)
Your physician wants you to follow-up in: 6 months with Dr Berry. You will receive a reminder letter in the mail two months in advance. If you don't receive a letter, please call our office to schedule the follow-up appointment.  

## 2013-07-23 NOTE — Assessment & Plan Note (Signed)
On statin therapy with his most recent lipid profile performed 09/19/12 revealing a total cholesterol of 142, LDL of 57 an HDL of 48

## 2013-07-23 NOTE — Progress Notes (Signed)
07/23/2013 [name]   02/04/1927  [id number]  Primary Physician Gwendolyn Grant, MD Primary Cardiologist: Lorretta Harp MD Renae Gloss  HPI:  The patient is an a 80 year old mildly-overweight married Caucasian male father of 2, grandfather of 1 grandchild, who is accompanied by [name] today, who is also a patient of mine. I saw him and his wife both back 35months ago. He has a history of insulin-dependent diabetes, hypertension, hyperlipidemia, and a strong family history of heart disease. He had critical aortic stenosis and a normal LV function with decreased exercise tolerance and increased dyspnea. I catheterized him January 04, 2010, revealing moderate but not critical CAD with severe aortic stenosis and a valve area of 0.71 sq cm. On February 17, 2010, he underwent coronary artery bypass grafting x2 with a LIMA to his LAD and a vein to an obtuse marginal branch and PDA as well as aortic valve replacement with an [employer] 21 pericardial tissue valve. His postoperative course was complicated by paroxysmal AFib and he was on amiodarone briefly, which he did not tolerate. He was re-admitted January 1-31 because of deconditioning. A 2D echo performed 3 months postoperatively showed a normal, functioning aortic prosthesis with normal LV function. Recent carotid Dopplers  revealed only mild right ICA stenosis. Most recent lab work 09/19/12 revealed a total cholesterol of 142, LDL of 57 an HDL of 48. He does complain of chronic shortness of breath but denies chest pain. Recent echo performed that revealed normal LV systolic function, well functioning aortic bioprosthesis. She does have cytopenia and if the hematologist may want to perform a bone marrow biopsy.     Current Outpatient Prescriptions  Medication Sig Dispense Refill  . acetaminophen (TYLENOL) 500 MG tablet Take 650 mg by mouth 2 (two) times daily as needed for pain.       Marland Kitchen allopurinol  (ZYLOPRIM) 300 MG tablet Take 1 tablet by mouth at  bedtime  90 tablet  3  . Ascorbic Acid (VITAMIN C PO) Take 1 tablet by mouth every morning.        Marland Kitchen CALCIUM PO Take 1 tablet by mouth every morning.        . cetirizine (ZYRTEC ALLERGY) 10 MG tablet Take 10 mg by mouth daily.      . Cholecalciferol (VITAMIN D-3 PO) Take 2,000 Units by mouth daily.        Marland Kitchen diltiazem (CARDIZEM CD) 180 MG 24 hr capsule Take 1 capsule (180 mg total) by mouth every evening.  90 capsule  3  . docusate sodium (COLACE) 100 MG capsule Take 400 mg by mouth daily.       . DULoxetine (CYMBALTA) 60 MG capsule Take 1 capsule (60 mg total) by mouth daily.  90 capsule  1  . furosemide (LASIX) 40 MG tablet Take 0.5 tablets (20 mg total) by mouth daily.  45 tablet  3  . metoprolol tartrate (LOPRESSOR) 25 MG tablet Take 1 tablet (25 mg total) by mouth 2 (two) times daily.  180 tablet  3  . Multiple Vitamins-Minerals (ICAPS MV PO) Take 1 tablet by mouth every morning.        Marland Kitchen omeprazole (PRILOSEC) 20 MG capsule       . potassium chloride (K-DUR) 10 MEQ tablet Take 1 tablet (10 mEq total) by mouth daily.  90 tablet  3  . Pyridoxine HCl (VITAMIN B-6 PO) Take 1 tablet by mouth every morning.        Marland Kitchen  ranitidine (ZANTAC) 150 MG tablet Take 0.5 tablets (75 mg total) by mouth every morning.  45 tablet  3  . rOPINIRole (REQUIP) 0.25 MG tablet Take 3 tablets by mouth at  bedtime  270 tablet  3  . rosuvastatin (CRESTOR) 10 MG tablet Take 1 tablet (10 mg total) by mouth at bedtime.  90 tablet  3  . solifenacin (VESICARE) 5 MG tablet Take 5 mg by mouth daily.      . Thiamine HCl (VITAMIN B-1 PO) Take 1 tablet by mouth every morning.         No current facility-administered medications for this visit.    Allergies  Allergen Reactions  . Doxycycline     rash  . Septra [Bactrim]     hallucinations  . Morphine Anxiety    REACTION: itching    History   Social History  . Marital Status: Married    Spouse Name: N/A    Number of  Children: 2  . Years of Education: N/A   Occupational History  . Retired     Geophysicist/field seismologist   Social History Main Topics  . Smoking status: Former Smoker -- 1.00 packs/day for 2 years    Types: Cigars    Quit date: 07/01/2009  . Smokeless tobacco: Never Used     Comment: Married, lives with spouse-2 kids. retired Geophysicist/field seismologist  . Alcohol Use: Not on file  . Drug Use: No  . Sexual Activity: Not on file   Other Topics Concern  . Not on file   Social History Narrative  . No narrative on file     Review of Systems: General: negative for chills, fever, night sweats or weight changes.  Cardiovascular: negative for chest pain, dyspnea on exertion, edema, orthopnea, palpitations, paroxysmal nocturnal dyspnea or shortness of breath Dermatological: negative for rash Respiratory: negative for cough or wheezing Urologic: negative for hematuria Abdominal: negative for nausea, vomiting, diarrhea, bright red blood per rectum, melena, or hematemesis Neurologic: negative for visual changes, syncope, or dizziness All other systems reviewed and are otherwise negative except as noted above.    Blood pressure 138/62, pulse 62, height $RemoveBe'5\' 10"'eyLqpyLLJ$  (1.778 m), weight 219 lb (99.338 kg).  General appearance: alert and no distress Neck: no adenopathy, no carotid bruit, no JVD, supple, symmetrical, trachea midline and thyroid not enlarged, symmetric, no tenderness/mass/nodules Lungs: clear to auscultation bilaterally Heart: regular rate and rhythm, S1, S2 normal, no murmur, click, rub or gallop Extremities: extremities normal, atraumatic, no cyanosis or edema  EKG normal sinus rhythm at 62 with no ST or T wave changes. There was a Q wave in leads III and F  ASSESSMENT AND PLAN:   S/P CABG x 3 History of CAD status post coronary artery bypass grafting x2 with LIMA to his LAD and a vein to obtuse marginal branch as well as PDA 02/17/10. He also had critical aortic stenosis and had aVR with an  [employer] 21 mm pericardial tissue valve. Recent 2-D echocardiogram performed 07/01/13 revealed normal LV systolic function with a well-functioning aortic tissue prosthesis. The patient is chronically short of breath but denied chest pain  HYPERTENSION Controlled on current medications  HYPERLIPIDEMIA On statin therapy with his most recent lipid profile performed 09/19/12 revealing a total cholesterol of 142, LDL of 57 an HDL of [place] MD [facility], [facility] 07/23/2013 12:19 PM

## 2013-07-23 NOTE — Assessment & Plan Note (Signed)
Controlled on current medications 

## 2013-07-23 NOTE — Assessment & Plan Note (Signed)
History of CAD status post coronary artery bypass grafting x2 with LIMA to his LAD and a vein to obtuse marginal branch as well as PDA 02/17/10. He also had critical aortic stenosis and had aVR with an  [employer] 21 mm pericardial tissue valve. Recent 2-D echocardiogram performed 07/01/13 revealed normal LV systolic function with a well-functioning aortic tissue prosthesis. The patient is chronically short of breath but denied chest pain

## 2013-07-30 ENCOUNTER — Telehealth: Payer: Self-pay | Admitting: Gastroenterology

## 2013-07-30 NOTE — Telephone Encounter (Signed)
Spoke with the spouse. The patient is having trouble swallowing. Appointment scheduled.

## 2013-08-06 ENCOUNTER — Ambulatory Visit (INDEPENDENT_AMBULATORY_CARE_PROVIDER_SITE_OTHER): Payer: Medicare Other | Admitting: Gastroenterology

## 2013-08-06 ENCOUNTER — Encounter: Payer: Self-pay | Admitting: Gastroenterology

## 2013-08-06 VITALS — BP 124/60 | HR 68 | Ht 70.0 in | Wt 218.0 lb

## 2013-08-06 DIAGNOSIS — I2510 Atherosclerotic heart disease of native coronary artery without angina pectoris: Secondary | ICD-10-CM

## 2013-08-06 DIAGNOSIS — R1310 Dysphagia, unspecified: Secondary | ICD-10-CM

## 2013-08-06 NOTE — Patient Instructions (Signed)
You have been scheduled for a modified barium swallow/barium swallow on 08/14/13 at 11:00am. Please arrive 15 minutes prior to your test for registration. You will go to [facility] (1st Floor) for your appointment. Please refrain from eating or drinking anything 4 hours prior to your test. Should you need to cancel or reschedule your appointment, please contact [phone] [facility]) or [phone] [place]). _____________________________________________________________________ A Modified Barium Swallow Study, or MBS, is a special x-ray that is taken to check swallowing skills. It is carried out by a Stage manager and a Psychologist, clinical (SLP). During this test, your mouth, throat, and esophagus, a muscular tube which connects your mouth to your stomach, is checked. The test will help you, your doctor, and the SLP plan what types of foods and liquids are easier for you to swallow. The SLP will also identify positions and ways to help you swallow more easily and safely. What will happen during an MBS? You will be taken to an x-ray room and seated comfortably. You will be asked to swallow small amounts of food and liquid mixed with barium. Barium is a liquid or paste that allows images of your mouth, throat and esophagus to be seen on x-ray. The x-ray captures moving images of the food you are swallowing as it travels from your mouth through your throat and into your esophagus. This test helps identify whether food or liquid is entering your lungs (aspiration). The test also shows which part of your mouth or throat lacks strength or coordination to move the food or liquid in the right direction. This test typically takes 30 minutes to 1 hour to complete. _______________________________________________________________________  Thank you for choosing me and [facility].  Pricilla Riffle. Dagoberto Ligas., MD., [facility] Regal

## 2013-08-06 NOTE — Progress Notes (Addendum)
    History of Present Illness: This is an 80 year old white male with multiple co-morbidities who is accompanied by his wife. She and he relate a several month history of intermittent difficulties swallowing large pills and occasional difficulty swallowing liquids. He occasionally has choking and gagging with liquids. Denies any problems with solid foods. He localizes his symptoms to his neck. He has a history of mild reflux but is asymptomatic from a reflux standpoint on omeprazole and ranitidine. He underwent colonoscopy in April 2010 for a history of adenomatous colon polyps. The colonoscopy was normal except for internal hemorrhoids. The patient's wife relates that his cardiologist, Dr. Gwenlyn Found, as advised him to avoid procedures and surgery.  Review of Systems: Pertinent positive and negative review of systems were noted in the above HPI section. All other review of systems were otherwise negative.  Current Medications, Allergies, Past Medical History, Past Surgical History, Family History and Social History were reviewed in [employer] record.  Physical Exam: General: Well developed , well nourished, chronically ill-appearing, no acute distress Head: Normocephalic and atraumatic Eyes:  sclerae anicteric, EOMI Ears: Normal auditory acuity Mouth: No deformity or lesions Neck: Supple, no masses or thyromegaly Lungs: Clear throughout to auscultation Heart: Regular rate and rhythm; no murmurs, rubs or bruits Abdomen: Soft, non tender and non distended. No masses, hepatosplenomegaly or hernias noted. Normal Bowel sounds Musculoskeletal: Symmetrical with no gross deformities  Skin: No lesions on visible extremities Pulses:  Normal pulses noted Extremities: No clubbing, cyanosis, edema or deformities noted Neurological: Alert oriented x 4, grossly nonfocal Cervical Nodes:  No significant cervical adenopathy Inguinal Nodes: No significant inguinal adenopathy Psychological:   Alert and cooperative. Normal mood and affect  Assessment and Recommendations:  1. Dysphagia with liquids and larger pills. Occasional choking. Presumed oropharyngeal dysphagia. Possible esophageal motility disorder. Not a good candidate for endoscopic procedures and sedation due to a higher risk. He may need to modify his intake of larger pills. Schedule MBSS and BA esophagram.

## 2013-08-07 ENCOUNTER — Encounter: Payer: Self-pay | Admitting: Internal Medicine

## 2013-08-07 ENCOUNTER — Other Ambulatory Visit (HOSPITAL_BASED_OUTPATIENT_CLINIC_OR_DEPARTMENT_OTHER): Payer: Medicare Other

## 2013-08-07 ENCOUNTER — Ambulatory Visit (HOSPITAL_BASED_OUTPATIENT_CLINIC_OR_DEPARTMENT_OTHER): Payer: Medicare Other | Admitting: Hematology and Oncology

## 2013-08-07 ENCOUNTER — Encounter: Payer: Self-pay | Admitting: Hematology and Oncology

## 2013-08-07 VITALS — BP 152/55 | HR 62 | Temp 97.1°F | Resp 18 | Ht 70.0 in | Wt 219.2 lb

## 2013-08-07 DIAGNOSIS — C9210 Chronic myeloid leukemia, BCR/ABL-positive, not having achieved remission: Secondary | ICD-10-CM

## 2013-08-07 DIAGNOSIS — C9310 Chronic myelomonocytic leukemia not having achieved remission: Secondary | ICD-10-CM

## 2013-08-07 DIAGNOSIS — D693 Immune thrombocytopenic purpura: Secondary | ICD-10-CM

## 2013-08-07 LAB — CBC WITH DIFFERENTIAL/PLATELET
BASO%: 0 % (ref 0.0–2.0)
Basophils Absolute: 0 10*3/uL (ref 0.0–0.1)
EOS%: 1.3 % (ref 0.0–7.0)
Eosinophils Absolute: 0.1 10*3/uL (ref 0.0–0.5)
HCT: 41.3 % (ref 38.4–49.9)
HGB: 14.3 g/dL (ref 13.0–17.1)
LYMPH%: 28.6 % (ref 14.0–49.0)
MCH: 31.6 pg (ref 27.2–33.4)
MCHC: 34.6 g/dL (ref 32.0–36.0)
MCV: 91.4 fL (ref 79.3–98.0)
MONO#: 1.4 10*3/uL — AB (ref 0.1–0.9)
MONO%: 35.1 % — AB (ref 0.0–14.0)
NEUT#: 1.4 10*3/uL — ABNORMAL LOW (ref 1.5–6.5)
NEUT%: 35 % — ABNORMAL LOW (ref 39.0–75.0)
Platelets: 80 10*3/uL — ABNORMAL LOW (ref 140–400)
RBC: 4.52 10*6/uL (ref 4.20–5.82)
RDW: 14 % (ref 11.0–14.6)
WBC: 4 10*3/uL (ref 4.0–10.3)
lymph#: 1.1 10*3/uL (ref 0.9–3.3)

## 2013-08-07 LAB — COMPREHENSIVE METABOLIC PANEL (CC13)
ALK PHOS: 84 U/L (ref 40–150)
ALT: 23 U/L (ref 0–55)
AST: 18 U/L (ref 5–34)
Albumin: 4.3 g/dL (ref 3.5–5.0)
Anion Gap: 8 mEq/L (ref 3–11)
BUN: 13.4 mg/dL (ref 7.0–26.0)
CALCIUM: 9.5 mg/dL (ref 8.4–10.4)
CHLORIDE: 105 meq/L (ref 98–109)
CO2: 29 mEq/L (ref 22–29)
CREATININE: 1.1 mg/dL (ref 0.7–1.3)
Glucose: 101 mg/dl (ref 70–140)
Potassium: 4.4 mEq/L (ref 3.5–5.1)
Sodium: 143 mEq/L (ref 136–145)
Total Bilirubin: 0.71 mg/dL (ref 0.20–1.20)
Total Protein: 6.9 g/dL (ref 6.4–8.3)

## 2013-08-07 LAB — URIC ACID (CC13): URIC ACID, SERUM: 4.8 mg/dL (ref 2.6–7.4)

## 2013-08-07 LAB — LACTATE DEHYDROGENASE (CC13): LDH: 166 U/L (ref 125–245)

## 2013-08-07 NOTE — Progress Notes (Signed)
[place] OFFICE PROGRESS NOTE  Patient Care Team: Rowe Clack, MD as PCP - General (Internal Medicine) Lorretta Harp, MD (Cardiology) Bonnita Levan, MD (Dermatology) Michel Bickers, MD as Consulting Physician (Infectious Diseases) Rigoberto Noel, MD as Consulting Physician (Pulmonary Disease) Myrlene Broker, MD as Consulting Physician (Urology) Ladene Artist, MD as Consulting Physician (Gastroenterology) Deliah Goody, MD as Consulting Physician (Ophthalmology) Heath Lark, MD as Consulting Physician (Hematology and Oncology)  SUMMARY OF ONCOLOGIC HISTORY: This is a pleasant 80 year old gentleman with chronic monocytosis and thrombocytopenia. Bone marrow biopsy from 2011 was abnormal, suspect probable CMML and ITP. He is observed.  INTERVAL HISTORY: Please see below for problem oriented charting. He has easy bruising. Recently, he fell at home and bruised his right flank. He did not have any major injury or bone fractures. The patient denies any recent signs or symptoms of bleeding such as spontaneous epistaxis, hematuria or hematochezia. He denies recent infection. REVIEW OF SYSTEMS:   Constitutional: Denies fevers, chills or abnormal weight loss Eyes: Denies blurriness of vision Ears, nose, mouth, throat, and face: Denies mucositis or sore throat Respiratory: Denies cough, dyspnea or wheezes Cardiovascular: Denies palpitation, chest discomfort or lower extremity swelling Gastrointestinal:  Denies nausea, heartburn or change in bowel habits Skin: Denies abnormal skin rashes Lymphatics: Denies new lymphadenopathy or easy bruising Neurological:Denies numbness, tingling or new weaknesses Behavioral/Psych: Mood is stable, no new changes  All other systems were reviewed with the patient and are negative.  I have reviewed the past medical history, past surgical history, social history and family history with the patient and they are unchanged from  previous note.  ALLERGIES:  is allergic to doxycycline; septra; and morphine.  MEDICATIONS:  Current Outpatient Prescriptions  Medication Sig Dispense Refill  . acetaminophen (TYLENOL) 500 MG tablet Take 650 mg by mouth 2 (two) times daily as needed for pain.       Marland Kitchen allopurinol (ZYLOPRIM) 300 MG tablet Take 1 tablet by mouth at  bedtime  90 tablet  3  . Ascorbic Acid (VITAMIN C PO) Take 1 tablet by mouth every morning.        Marland Kitchen CALCIUM PO Take 1 tablet by mouth every morning.        . cetirizine (ZYRTEC ALLERGY) 10 MG tablet Take 10 mg by mouth daily.      . Cholecalciferol (VITAMIN D-3 PO) Take 2,000 Units by mouth daily.        Marland Kitchen diltiazem (CARDIZEM CD) 180 MG 24 hr capsule Take 1 capsule (180 mg total) by mouth every evening.  90 capsule  3  . docusate sodium (COLACE) 100 MG capsule Take 400 mg by mouth daily.       . DULoxetine (CYMBALTA) 60 MG capsule Take 1 capsule (60 mg total) by mouth daily.  90 capsule  1  . furosemide (LASIX) 40 MG tablet Take 0.5 tablets (20 mg total) by mouth daily.  45 tablet  3  . metoprolol tartrate (LOPRESSOR) 25 MG tablet Take 1 tablet (25 mg total) by mouth 2 (two) times daily.  180 tablet  3  . Multiple Vitamins-Minerals (ICAPS MV PO) Take 1 tablet by mouth every morning.        Marland Kitchen omeprazole (PRILOSEC) 20 MG capsule       . potassium chloride (K-DUR) 10 MEQ tablet Take 1 tablet (10 mEq total) by mouth daily.  90 tablet  3  . Pyridoxine HCl (VITAMIN B-6 PO) Take 1 tablet  by mouth every morning.        . ranitidine (ZANTAC) 150 MG tablet Take 0.5 tablets (75 mg total) by mouth every morning.  45 tablet  3  . rOPINIRole (REQUIP) 0.25 MG tablet Take 3 tablets by mouth at  bedtime  270 tablet  3  . rosuvastatin (CRESTOR) 10 MG tablet Take 1 tablet (10 mg total) by mouth at bedtime.  90 tablet  3  . solifenacin (VESICARE) 5 MG tablet Take 5 mg by mouth daily.      . Thiamine HCl (VITAMIN B-1 PO) Take 1 tablet by mouth every morning.         No current  facility-administered medications for this visit.    PHYSICAL EXAMINATION: ECOG PERFORMANCE STATUS: 1 - Symptomatic but completely ambulatory  Filed Vitals:   08/07/13 1152  BP: 152/55  Pulse: 62  Temp: 97.1 F (36.2 C)  Resp: 18   Filed Weights   08/07/13 1152  Weight: 219 lb 3.2 oz (99.428 kg)    GENERAL:alert, no distress and comfortable SKIN: skin color, texture, turgor are normal, no rashes or significant lesions apart from new bruise around his right flank. EYES: normal, Conjunctiva are pink and non-injected, sclera clear OROPHARYNX:no exudate, no erythema and lips, buccal mucosa, and tongue normal  NECK: supple, thyroid normal size, non-tender, without nodularity LYMPH:  no palpable lymphadenopathy in the cervical, axillary or inguinal LUNGS: clear to auscultation and percussion with normal breathing effort HEART: regular rate & rhythm and no murmurs and no lower extremity edema ABDOMEN:abdomen soft, non-tender and normal bowel sounds Musculoskeletal:no cyanosis of digits and no clubbing  NEURO: alert & oriented x 3 with fluent speech, no focal motor/sensory deficits. He has poor hearing.  LABORATORY DATA:  I have reviewed the data as listed    Component Value Date/Time   NA 143 08/07/2013 1138   NA 140 07/10/2013 0950   K 4.4 08/07/2013 1138   K 4.2 07/10/2013 0950   CL 101 07/10/2013 0950   CO2 29 08/07/2013 1138   CO2 33* 07/10/2013 0950   GLUCOSE 101 08/07/2013 1138   GLUCOSE 101* 07/10/2013 0950   BUN 13.4 08/07/2013 1138   BUN 15 07/10/2013 0950   CREATININE 1.1 08/07/2013 1138   CREATININE 1.1 07/10/2013 0950   CALCIUM 9.5 08/07/2013 1138   CALCIUM 10.1 07/10/2013 0950   PROT 6.9 08/07/2013 1138   PROT 7.1 07/10/2013 0950   ALBUMIN 4.3 08/07/2013 1138   ALBUMIN 4.7 07/10/2013 0950   AST 18 08/07/2013 1138   AST 23 07/10/2013 0950   ALT 23 08/07/2013 1138   ALT 21 07/10/2013 0950   ALKPHOS 84 08/07/2013 1138   ALKPHOS 79 07/10/2013 0950   BILITOT 0.71 08/07/2013 1138    BILITOT 1.0 07/10/2013 0950   GFRNONAA 77* 12/05/2010 0833   GFRAA 89* 12/05/2010 0833    No results found for this basename: SPEP,  UPEP,   kappa and lambda light chains    Lab Results  Component Value Date   WBC 4.0 08/07/2013   NEUTROABS 1.4* 08/07/2013   HGB 14.3 08/07/2013   HCT 41.3 08/07/2013   MCV 91.4 08/07/2013   PLT 80* 08/07/2013      Chemistry      Component Value Date/Time   NA 143 08/07/2013 1138   NA 140 07/10/2013 0950   K 4.4 08/07/2013 1138   K 4.2 07/10/2013 0950   CL 101 07/10/2013 0950   CO2 29 08/07/2013 1138   CO2 33*  07/10/2013 0950   BUN 13.4 08/07/2013 1138   BUN 15 07/10/2013 0950   CREATININE 1.1 08/07/2013 1138   CREATININE 1.1 07/10/2013 0950      Component Value Date/Time   CALCIUM 9.5 08/07/2013 1138   CALCIUM 10.1 07/10/2013 0950   ALKPHOS 84 08/07/2013 1138   ALKPHOS 79 07/10/2013 0950   AST 18 08/07/2013 1138   AST 23 07/10/2013 0950   ALT 23 08/07/2013 1138   ALT 21 07/10/2013 0950   BILITOT 0.71 08/07/2013 1138   BILITOT 1.0 07/10/2013 0950      ASSESSMENT & PLAN:  CMML (chronic myelomonocytic leukemia) Clinically, his platelet counts have been very stable. His last worsening thrombocytopenia was likely caused by recent infection. This has improved. At this point in time, I would not repeat bone marrow aspirate and biopsy. He does not require treatment. I will see him back on a yearly basis with history, physical examination and blood work.    Orders Placed This Encounter  Procedures  . CBC with Differential    Standing Status: Future     Number of Occurrences:      Standing Expiration Date: 08/07/2014   All questions were answered. The patient knows to call the clinic with any problems, questions or concerns. No barriers to learning was detected. I spent 15 minutes counseling the patient face to face. The total time spent in the appointment was 20 minutes and more than 50% was on counseling and review of test results     [facility], Wilburt Messina,  MD 08/07/2013 8:36 PM

## 2013-08-07 NOTE — Assessment & Plan Note (Signed)
Clinically, his platelet counts have been very stable. His last worsening thrombocytopenia was likely caused by recent infection. This has improved. At this point in time, I would not repeat bone marrow aspirate and biopsy. He does not require treatment. I will see him back on a yearly basis with history, physical examination and blood work.

## 2013-08-08 ENCOUNTER — Telehealth: Payer: Self-pay | Admitting: Hematology and Oncology

## 2013-08-08 ENCOUNTER — Other Ambulatory Visit (HOSPITAL_COMMUNITY): Payer: Self-pay | Admitting: Gastroenterology

## 2013-08-08 DIAGNOSIS — R1310 Dysphagia, unspecified: Secondary | ICD-10-CM

## 2013-08-08 NOTE — Telephone Encounter (Signed)
s.w. pt wife and advised on July 2016 appt....did not want to sched appt...pp will do lab appt cx

## 2013-08-12 ENCOUNTER — Ambulatory Visit (HOSPITAL_COMMUNITY)
Admission: RE | Admit: 2013-08-12 | Discharge: 2013-08-12 | Disposition: A | Discharge status: Home / Self Care | Payer: Medicare Other | Source: Ambulatory Visit | Attending: Gastroenterology | Admitting: Gastroenterology

## 2013-08-12 DIAGNOSIS — R1310 Dysphagia, unspecified: Secondary | ICD-10-CM | POA: Diagnosis not present

## 2013-08-12 DIAGNOSIS — R1313 Dysphagia, pharyngeal phase: Secondary | ICD-10-CM | POA: Insufficient documentation

## 2013-08-12 NOTE — Procedures (Signed)
Objective Swallowing Evaluation: Modified Barium Swallowing Study  Patient Details  Name: [name] MRN: [id number] Date of Birth: 05/05/1927  Today's Date: 08/12/2013 Time: 1100-1125 SLP Time Calculation (min): 25 min  Past Medical History:  Past Medical History  Diagnosis Date  . Adenomatous colon polyp 03/1993  . Prostate cancer   . Depression   . OSA (obstructive sleep apnea)   . Hearing loss     wears hearing aides bilaterally  . Atrial fibrillation     not anticoag candidate due to decond and falls  . Legally blind 1987  . AORTIC STENOSIS     s/p AVR 01/2010  . CLOSTRIDIUM DIFFICILE COLITIS 12/30/2009  . CORONARY ARTERY DISEASE     s/p CABG 01/28/10  . HIP REPLACEMENT, RIGHT, HX OF 2006  . HYPERTROPHY PROSTATE W/UR OBST & OTH LUTS   . MRSA infection 04/2010  . Gout   . THROMBOCYTOPENIA     likely ITP with CMML (bone marrow bx 2011)  . Arthritis   . DEGENERATIVE DISC DISEASE   . Diabetes mellitus, type 2   . GERD   . Hyperlipidemia   . Hypertension   . MRSA infection greater than 3 months ago   . CMML (chronic myelomonocytic leukemia)    Past Surgical History:  Past Surgical History  Procedure Laterality Date  . Coronary artery bypass graft  01/28/2010    x3  . Aortic valve replacement  01/28/2010  . Prostatectomy  1993  . Hernia repair  03/15/2009    left  . Anal sphincterotomy  1996  . Total hip arthroplasty  2006    left  . Cataract extraction, bilateral      4/11 & 5/11  . Cholecystectomy  2011   HPI:  80 yr old seen for outpatient MBS accompanied by his wife.  PMH includes CVA 1986, 2006 and several TIA's, pna (not in past yr), GERD on meds, CABG, prostate cancer, arthritis.     Assessment / Plan / Recommendation Clinical Impression  Dysphagia Diagnosis: Mild pharyngeal phase dysphagia;Moderate pharyngeal phase dysphagia Clinical impression: Pt. exhibited a mild-moderate pharyngeal dysphagia characterized by motor impairments.  Decreased  laryngeal elevation and laryngeal closure led to (silent) penetration with thin liquids.  A chin tuck posture consistently prevented entrance of  barium into laryngeal vestibule during this study.  Reduced tongue base retraction and decreased laryngeal elevation led to pyriform sinus residue with decreased sensation/awareness).  Verbal cues for second swallow were effective.  SLP recommends continue regular texture (moist meets, caution with dry crumbly foods) and thin liquids with chin tuck (liquids only), pills whole in applesauce and swallow twice.      Treatment Recommendation  No treatment recommended at this time    Diet Recommendation Regular;Thin liquid   Liquid Administration via: Cup (cup preferable) Medication Administration: Whole meds with puree Supervision: Patient able to self feed Compensations: Slow rate;Small sips/bites;Multiple dry swallows after each bite/sip Postural Changes and/or Swallow Maneuvers: Chin tuck    Other  Recommendations Oral Care Recommendations: Oral care BID   Follow Up Recommendations  None    Frequency and Duration        Pertinent Vitals/Pain WDL         Reason for Referral Objectively evaluate swallowing function   Oral Phase Oral Preparation/Oral Phase Oral Phase: WFL   Pharyngeal Phase Pharyngeal Phase Pharyngeal Phase: Impaired Pharyngeal - Thin Pharyngeal - Thin Cup: Penetration/Aspiration during swallow;Pharyngeal residue - valleculae;Pharyngeal residue - pyriform sinuses;Reduced tongue base retraction;Reduced laryngeal elevation;Reduced  airway/laryngeal closure Penetration/Aspiration details (thin cup): Material enters airway, remains ABOVE vocal cords and not ejected out Pharyngeal - Solids Pharyngeal - Regular: Pharyngeal residue - valleculae;Reduced tongue base retraction  Cervical Esophageal Phase    GO    Cervical Esophageal Phase Cervical Esophageal Phase: [facility]    Functional Assessment Tool Used: clinical  judgement Functional Limitations: Swallowing Swallow Current Status (T0626): At least 20 percent but less than 40 percent impaired, limited or restricted Swallow Goal Status [phone]): At least 20 percent but less than 40 percent impaired, limited or restricted Swallow Discharge Status [id number]): At least 20 percent but less than 40 percent impaired, limited or restricted    Houston Siren M.Ed [employer] [phone]  08/12/2013

## 2013-08-13 ENCOUNTER — Ambulatory Visit: Payer: Medicare Other | Admitting: Cardiovascular Disease

## 2013-08-14 ENCOUNTER — Ambulatory Visit (HOSPITAL_COMMUNITY): Payer: Medicare Other

## 2013-08-21 ENCOUNTER — Ambulatory Visit: Payer: Medicare Other | Admitting: Internal Medicine

## 2013-09-22 ENCOUNTER — Ambulatory Visit (INDEPENDENT_AMBULATORY_CARE_PROVIDER_SITE_OTHER): Payer: Medicare Other | Admitting: Adult Health

## 2013-09-22 ENCOUNTER — Encounter: Payer: Self-pay | Admitting: Adult Health

## 2013-09-22 VITALS — BP 132/68 | HR 65 | Ht 70.0 in | Wt 218.8 lb

## 2013-09-22 DIAGNOSIS — I2510 Atherosclerotic heart disease of native coronary artery without angina pectoris: Secondary | ICD-10-CM

## 2013-09-22 DIAGNOSIS — G4733 Obstructive sleep apnea (adult) (pediatric): Secondary | ICD-10-CM

## 2013-09-22 NOTE — Progress Notes (Signed)
Subjective:    Patient ID: [name], male    DOB: 24-Oct-1927, 80 y.o.   MRN: [id number]  HPI 86/M for FU of obstructive sleep apnea & excessive somnolence / restless legs  Oct 2011 >> staph sepsis  Nov 2011 >> c .diff colitis, low plts  Underwent CABG x3 Pig AVR in jan '12, in cardiac rehab  H/o Monocytosis, thrombocytopenia - ? Idiopathic, neg BM biopsy ([place])  He had remote sleep study in [place], another Study in 2005  On VPAP auto , mirage full face mask, pr increased to 12 cm in jan'12 after download check  Download 9/17-05/02/10 shows avg pr 15 cm, AHI 14.4 residual with predominant hypopneas, some leak & excellent compliance >> Pr increased to 17 cm  Reviewed ONO on CPAP >> no desaturation , Does not need O2 Download 8/2 -09/07/10 showed avg pressure 11 cm, avg usage 5 h, minimal leak  09/18/2011 VPAP auto download shows residual AHI 9/h, leak ++, excellent usage on 17 cm (14 on expiration)    Called stating patients VPAP is stuck in EPAP mode and patient has over 21,000 hours on machine. He has Medicare and therefore will need face to face with RA before getting new VPAP  06/23/13  Download on 17 cm, no residuals, does not document usage accurately Pr ok,mask ok, no leak bedtime 10p, minimal latency, Nocturia + since prostate sx, oob at 0930 , feels refreshed , no headaches, daytime naps +   09/22/2013 Follow up OSA  Pt returns for 3 month follow up for OSA Started on BIPAP last ov.  Wearing BIPAP around 10-12 hrs .  Naps on/off during daytime. Could go to sleep anytime. No very active. PT helps him 3 x/wk, water aerobics weekly. Yoga weekly ,  -only with PT. Legally blind due to stroke.  Has had several CVA/TIA in past  Echo 06/2013 showed EF 55-60% .  Download shows good compliance with 100% usage, 90% >4 hr each night.  Continued Central and Obstructive events with AHI ~38 .  No wt loss.  Lives at home with wife. Married 63 yrs . She helps with his care.  No  fever, chest pain, orthopnea, edema .     Past Medical History  Diagnosis Date  . Adenomatous colon polyp 03/1993  . Prostate cancer   . Depression   . OSA (obstructive sleep apnea)   . Hearing loss     wears hearing aides bilaterally  . Atrial fibrillation     not anticoag candidate due to decond and falls  . Legally blind 1987  . AORTIC STENOSIS     s/p AVR 01/2010  . CLOSTRIDIUM DIFFICILE COLITIS 12/30/2009  . CORONARY ARTERY DISEASE     s/p CABG 01/28/10  . HIP REPLACEMENT, RIGHT, HX OF 2006  . HYPERTROPHY PROSTATE W/UR OBST & OTH LUTS   . MRSA infection 04/2010  . Gout   . THROMBOCYTOPENIA     likely ITP with CMML (bone marrow bx 2011)  . Arthritis   . DEGENERATIVE DISC DISEASE   . Diabetes mellitus, type 2   . GERD   . Hyperlipidemia   . Hypertension   . MRSA infection greater than 3 months ago   . CMML (chronic myelomonocytic leukemia)      Review of Systems  Pt denies any significant  nasal congestion or excess secretions, fever, chills, sweats, unintended wt loss, pleuritic or exertional cp, orthopnea pnd or leg swelling.  Pt also denies any  obvious fluctuation in symptoms with weather or environmental change or other alleviating or aggravating factors.    Pt denies any increase in rescue therapy over baseline, denies waking up needing it or having early am exacerbations or coughing/wheezing/ or dyspnea      Objective:   Physical Exam   Gen. Pleasant, chronically ill appearing  ENT - no lesions, no post nasal drip, legally blind  Neck: No JVD, no thyromegaly, no carotid bruits Lungs: no use of accessory muscles, no dullness to percussion, clear without rales or rhonchi  Cardiovascular: Rhythm regular, heart sounds  normal, no murmurs or gallops, tr peripheral edema Musculoskeletal: No deformities, no cyanosis or clubbing        Assessment & Plan:

## 2013-09-22 NOTE — Assessment & Plan Note (Addendum)
Complex OSA w/ central and obstructive sleep apnea w/ poor control despite change to BIPAP , persistent central apneas on download with AHI ~38.  Pt has hx of CVA -multiple in past.  Will set pt up for titration study to evaluate on BIPAP w/ possible transition to ASV (Adaptive servo-ventilation)if needed  Follow up with Dr. Elsworth Soho  In 6 weeks and As needed   Plan  Continue on BIPAP At bedtime  And with naps Keep working on weight loss.  We are setting you up for a titration study on your BIPAP .  Follow up with Dr. Elsworth Soho  In 6-8 weeks and As needed

## 2013-09-22 NOTE — Patient Instructions (Signed)
Continue on BIPAP At bedtime  And with naps Keep working on weight loss.  We are setting you up for a titration study on your BIPAP .  Follow up with Dr. Elsworth Soho  In 6-8 weeks and As needed

## 2013-09-22 NOTE — Progress Notes (Signed)
Reviewed & agree with plan  

## 2013-10-06 ENCOUNTER — Encounter: Payer: Self-pay | Admitting: Gastroenterology

## 2013-10-06 ENCOUNTER — Ambulatory Visit: Payer: Medicare Other | Admitting: Internal Medicine

## 2013-10-08 ENCOUNTER — Emergency Department (HOSPITAL_COMMUNITY): Payer: Medicare Other

## 2013-10-08 ENCOUNTER — Emergency Department (HOSPITAL_COMMUNITY)
Admission: EM | Admit: 2013-10-08 | Discharge: 2013-10-08 | Disposition: A | Discharge status: Home / Self Care | Payer: Medicare Other | Attending: Emergency Medicine | Admitting: Emergency Medicine

## 2013-10-08 ENCOUNTER — Encounter (HOSPITAL_COMMUNITY): Payer: Self-pay | Admitting: Emergency Medicine

## 2013-10-08 DIAGNOSIS — R5381 Other malaise: Secondary | ICD-10-CM | POA: Diagnosis not present

## 2013-10-08 DIAGNOSIS — Z8546 Personal history of malignant neoplasm of prostate: Secondary | ICD-10-CM | POA: Diagnosis not present

## 2013-10-08 DIAGNOSIS — Z856 Personal history of leukemia: Secondary | ICD-10-CM | POA: Insufficient documentation

## 2013-10-08 DIAGNOSIS — R011 Cardiac murmur, unspecified: Secondary | ICD-10-CM | POA: Insufficient documentation

## 2013-10-08 DIAGNOSIS — I10 Essential (primary) hypertension: Secondary | ICD-10-CM | POA: Insufficient documentation

## 2013-10-08 DIAGNOSIS — E119 Type 2 diabetes mellitus without complications: Secondary | ICD-10-CM | POA: Diagnosis not present

## 2013-10-08 DIAGNOSIS — E785 Hyperlipidemia, unspecified: Secondary | ICD-10-CM | POA: Diagnosis not present

## 2013-10-08 DIAGNOSIS — I2510 Atherosclerotic heart disease of native coronary artery without angina pectoris: Secondary | ICD-10-CM | POA: Diagnosis not present

## 2013-10-08 DIAGNOSIS — I6789 Other cerebrovascular disease: Secondary | ICD-10-CM | POA: Diagnosis not present

## 2013-10-08 DIAGNOSIS — Z87891 Personal history of nicotine dependence: Secondary | ICD-10-CM | POA: Insufficient documentation

## 2013-10-08 DIAGNOSIS — I4891 Unspecified atrial fibrillation: Secondary | ICD-10-CM | POA: Diagnosis not present

## 2013-10-08 DIAGNOSIS — D696 Thrombocytopenia, unspecified: Secondary | ICD-10-CM | POA: Diagnosis not present

## 2013-10-08 DIAGNOSIS — Z79899 Other long term (current) drug therapy: Secondary | ICD-10-CM | POA: Insufficient documentation

## 2013-10-08 DIAGNOSIS — M129 Arthropathy, unspecified: Secondary | ICD-10-CM | POA: Insufficient documentation

## 2013-10-08 DIAGNOSIS — IMO0002 Reserved for concepts with insufficient information to code with codable children: Secondary | ICD-10-CM | POA: Insufficient documentation

## 2013-10-08 DIAGNOSIS — W108XXA Fall (on) (from) other stairs and steps, initial encounter: Secondary | ICD-10-CM | POA: Diagnosis not present

## 2013-10-08 DIAGNOSIS — R5383 Other fatigue: Secondary | ICD-10-CM

## 2013-10-08 DIAGNOSIS — S4980XA Other specified injuries of shoulder and upper arm, unspecified arm, initial encounter: Secondary | ICD-10-CM | POA: Diagnosis present

## 2013-10-08 DIAGNOSIS — W19XXXA Unspecified fall, initial encounter: Secondary | ICD-10-CM

## 2013-10-08 DIAGNOSIS — Z87448 Personal history of other diseases of urinary system: Secondary | ICD-10-CM | POA: Diagnosis not present

## 2013-10-08 DIAGNOSIS — S46909A Unspecified injury of unspecified muscle, fascia and tendon at shoulder and upper arm level, unspecified arm, initial encounter: Secondary | ICD-10-CM | POA: Insufficient documentation

## 2013-10-08 DIAGNOSIS — Z96649 Presence of unspecified artificial hip joint: Secondary | ICD-10-CM | POA: Diagnosis not present

## 2013-10-08 DIAGNOSIS — Z8619 Personal history of other infectious and parasitic diseases: Secondary | ICD-10-CM | POA: Insufficient documentation

## 2013-10-08 DIAGNOSIS — Z8673 Personal history of transient ischemic attack (TIA), and cerebral infarction without residual deficits: Secondary | ICD-10-CM | POA: Diagnosis not present

## 2013-10-08 DIAGNOSIS — K219 Gastro-esophageal reflux disease without esophagitis: Secondary | ICD-10-CM | POA: Insufficient documentation

## 2013-10-08 DIAGNOSIS — H9190 Unspecified hearing loss, unspecified ear: Secondary | ICD-10-CM | POA: Diagnosis not present

## 2013-10-08 DIAGNOSIS — F3289 Other specified depressive episodes: Secondary | ICD-10-CM | POA: Diagnosis not present

## 2013-10-08 DIAGNOSIS — Z8614 Personal history of Methicillin resistant Staphylococcus aureus infection: Secondary | ICD-10-CM | POA: Insufficient documentation

## 2013-10-08 DIAGNOSIS — Z860100 Personal history of colon polyps, unspecified: Secondary | ICD-10-CM | POA: Insufficient documentation

## 2013-10-08 DIAGNOSIS — Z8669 Personal history of other diseases of the nervous system and sense organs: Secondary | ICD-10-CM | POA: Diagnosis not present

## 2013-10-08 DIAGNOSIS — S7000XA Contusion of unspecified hip, initial encounter: Secondary | ICD-10-CM | POA: Diagnosis not present

## 2013-10-08 DIAGNOSIS — Y9289 Other specified places as the place of occurrence of the external cause: Secondary | ICD-10-CM | POA: Insufficient documentation

## 2013-10-08 DIAGNOSIS — Y9389 Activity, other specified: Secondary | ICD-10-CM | POA: Insufficient documentation

## 2013-10-08 DIAGNOSIS — Z951 Presence of aortocoronary bypass graft: Secondary | ICD-10-CM | POA: Insufficient documentation

## 2013-10-08 DIAGNOSIS — F329 Major depressive disorder, single episode, unspecified: Secondary | ICD-10-CM | POA: Diagnosis not present

## 2013-10-08 DIAGNOSIS — S40019A Contusion of unspecified shoulder, initial encounter: Secondary | ICD-10-CM | POA: Insufficient documentation

## 2013-10-08 DIAGNOSIS — Z954 Presence of other heart-valve replacement: Secondary | ICD-10-CM | POA: Diagnosis not present

## 2013-10-08 DIAGNOSIS — T148XXA Other injury of unspecified body region, initial encounter: Secondary | ICD-10-CM

## 2013-10-08 DIAGNOSIS — M109 Gout, unspecified: Secondary | ICD-10-CM | POA: Diagnosis not present

## 2013-10-08 LAB — COMPREHENSIVE METABOLIC PANEL
ALBUMIN: 4 g/dL (ref 3.5–5.2)
ALT: 13 U/L (ref 0–53)
ANION GAP: 11 (ref 5–15)
AST: 16 U/L (ref 0–37)
Alkaline Phosphatase: 86 U/L (ref 39–117)
BUN: 15 mg/dL (ref 6–23)
CALCIUM: 9.5 mg/dL (ref 8.4–10.5)
CO2: 27 mEq/L (ref 19–32)
Chloride: 101 mEq/L (ref 96–112)
Creatinine, Ser: 1.19 mg/dL (ref 0.50–1.35)
GFR calc non Af Amer: 54 mL/min — ABNORMAL LOW (ref >90)
GFR, EST AFRICAN AMERICAN: 63 mL/min — AB (ref >90)
GLUCOSE: 99 mg/dL (ref 70–99)
Potassium: 4.4 mEq/L (ref 3.7–5.3)
Sodium: 139 mEq/L (ref 137–147)
TOTAL PROTEIN: 6.7 g/dL (ref 6.0–8.3)
Total Bilirubin: 0.5 mg/dL (ref 0.3–1.2)

## 2013-10-08 LAB — URINALYSIS, ROUTINE W REFLEX MICROSCOPIC
BILIRUBIN URINE: NEGATIVE
Glucose, UA: NEGATIVE mg/dL
Hgb urine dipstick: NEGATIVE
KETONES UR: NEGATIVE mg/dL
Leukocytes, UA: NEGATIVE
NITRITE: NEGATIVE
PROTEIN: NEGATIVE mg/dL
Specific Gravity, Urine: 1.011 (ref 1.005–1.030)
UROBILINOGEN UA: 0.2 mg/dL (ref 0.0–1.0)
pH: 5.5 (ref 5.0–8.0)

## 2013-10-08 LAB — CBC WITH DIFFERENTIAL/PLATELET
BASOS PCT: 0 % (ref 0–1)
Basophils Absolute: 0 10*3/uL (ref 0.0–0.1)
EOS ABS: 0 10*3/uL (ref 0.0–0.7)
Eosinophils Relative: 0 % (ref 0–5)
HCT: 38 % — ABNORMAL LOW (ref 39.0–52.0)
HEMOGLOBIN: 13.2 g/dL (ref 13.0–17.0)
Lymphocytes Relative: 26 % (ref 12–46)
Lymphs Abs: 1.5 10*3/uL (ref 0.7–4.0)
MCH: 31.9 pg (ref 26.0–34.0)
MCHC: 34.7 g/dL (ref 30.0–36.0)
MCV: 91.8 fL (ref 78.0–100.0)
MONO ABS: 1.7 10*3/uL — AB (ref 0.1–1.0)
Monocytes Relative: 30 % — ABNORMAL HIGH (ref 3–12)
Neutro Abs: 2.5 10*3/uL (ref 1.7–7.7)
Neutrophils Relative %: 44 % (ref 43–77)
PLATELETS: 83 10*3/uL — AB (ref 150–400)
RBC: 4.14 MIL/uL — ABNORMAL LOW (ref 4.22–5.81)
RDW: 14.4 % (ref 11.5–15.5)
WBC: 5.7 10*3/uL (ref 4.0–10.5)

## 2013-10-08 MED ORDER — SODIUM CHLORIDE 0.9 % IV BOLUS (SEPSIS)
500.0000 mL | Freq: Once | INTRAVENOUS | Status: AC
  Administered 2013-10-08: 500 mL via INTRAVENOUS

## 2013-10-08 NOTE — Discharge Instructions (Signed)
Read the information below.  You may return to the Emergency Department at any time for worsening condition or any new symptoms that concern you.  If you develop uncontrolled pain, weakness or numbness of the extremity, severe discoloration of the skin, or you are unable to walk, return to the ER for a recheck.      Your caregiver has seen you today because you are having problems with feelings of weakness, dizziness, and/or fatigue. Weakness has many different causes, some of which are common and others are very rare. Your caregiver has considered some of the most common causes of weakness and feels it is safe for you to go home and be observed. Not every illness or injury can be identified during an emergency department visit, thus follow-up with your primary healthcare provider is important. Medical conditions can also worsen, so it is also important to return immediately as directed below, or if you have other serious concerns develop. RETURN IMMEDIATELY IF you develop new shortness of breath, chest pain, fever, have difficulty moving parts of your body (new weakness, numbness, or incoordination), sudden change in speech, vision, swallowing, or understanding, faint or develop new dizziness, severe headache, become poorly responsive or have an altered mental status compared to baseline for you, new rash, abdominal pain, or bloody stools,  Return sooner also if you develop new problems for which you have not talked to your caregiver but you feel may be emergency medical conditions, or are unable to be cared for safely at home.   Contusion A contusion is a deep bruise. Contusions are the result of an injury that caused bleeding under the skin. The contusion may turn blue, purple, or yellow. Minor injuries will give you a painless contusion, but more severe contusions may stay painful and swollen for a few weeks.  CAUSES  A contusion is usually caused by a blow, trauma, or direct force to an area of the  body. SYMPTOMS   Swelling and redness of the injured area.  Bruising of the injured area.  Tenderness and soreness of the injured area.  Pain. DIAGNOSIS  The diagnosis can be made by taking a history and physical exam. An X-ray, CT scan, or MRI may be needed to determine if there were any associated injuries, such as fractures. TREATMENT  Specific treatment will depend on what area of the body was injured. In general, the best treatment for a contusion is resting, icing, elevating, and applying cold compresses to the injured area. Over-the-counter medicines may also be recommended for pain control. Ask your caregiver what the best treatment is for your contusion. HOME CARE INSTRUCTIONS   Put ice on the injured area.  Put ice in a plastic bag.  Place a towel between your skin and the bag.  Leave the ice on for 15-20 minutes, 3-4 times a day, or as directed by your health care provider.  Only take over-the-counter or prescription medicines for pain, discomfort, or fever as directed by your caregiver. Your caregiver may recommend avoiding anti-inflammatory medicines (aspirin, ibuprofen, and naproxen) for 48 hours because these medicines may increase bruising.  Rest the injured area.  If possible, elevate the injured area to reduce swelling. SEEK IMMEDIATE MEDICAL CARE IF:   You have increased bruising or swelling.  You have pain that is getting worse.  Your swelling or pain is not relieved with medicines. MAKE SURE YOU:   Understand these instructions.  Will watch your condition.  Will get help right away if you are  not doing well or get worse. Document Released: 10/19/2004 Document Revised: 01/14/2013 Document Reviewed: 11/14/2010 [facility] Patient Information 2015 [place], [place]. This information is not intended to replace advice given to you by your health care provider. Make sure you discuss any questions you have with your health care provider.  Fall Prevention and  Home Safety Falls cause injuries and can affect all age groups. It is possible to use preventive measures to significantly decrease the likelihood of falls. There are many simple measures which can make your home safer and prevent falls. OUTDOORS  Repair cracks and edges of walkways and driveways.  Remove high doorway thresholds.  Trim shrubbery on the main path into your home.  Have good outside lighting.  Clear walkways of tools, rocks, debris, and clutter.  Check that handrails are not broken and are securely fastened. Both sides of steps should have handrails.  Have leaves, snow, and ice cleared regularly.  Use sand or salt on walkways during winter months.  In the garage, clean up grease or oil spills. BATHROOM  Install night lights.  Install grab bars by the toilet and in the tub and shower.  Use non-skid mats or decals in the tub or shower.  Place a plastic non-slip stool in the shower to sit on, if needed.  Keep floors dry and clean up all water on the floor immediately.  Remove soap buildup in the tub or shower on a regular basis.  Secure bath mats with non-slip, double-sided rug tape.  Remove throw rugs and tripping hazards from the floors. BEDROOMS  Install night lights.  Make sure a bedside light is easy to reach.  Do not use oversized bedding.  Keep a telephone by your bedside.  Have a firm chair with side arms to use for getting dressed.  Remove throw rugs and tripping hazards from the floor. KITCHEN  Keep handles on pots and pans turned toward the center of the stove. Use back burners when possible.  Clean up spills quickly and allow time for drying.  Avoid walking on wet floors.  Avoid hot utensils and knives.  Position shelves so they are not too high or low.  Place commonly used objects within easy reach.  If necessary, use a sturdy step stool with a grab bar when reaching.  Keep electrical cables out of the way.  Do not use floor  polish or wax that makes floors slippery. If you must use wax, use non-skid floor wax.  Remove throw rugs and tripping hazards from the floor. STAIRWAYS  Never leave objects on stairs.  Place handrails on both sides of stairways and use them. Fix any loose handrails. Make sure handrails on both sides of the stairways are as long as the stairs.  Check carpeting to make sure it is firmly attached along stairs. Make repairs to worn or loose carpet promptly.  Avoid placing throw rugs at the top or bottom of stairways, or properly secure the rug with carpet tape to prevent slippage. Get rid of throw rugs, if possible.  Have an electrician put in a light switch at the top and bottom of the stairs. OTHER FALL PREVENTION TIPS  Wear low-heel or rubber-soled shoes that are supportive and fit well. Wear closed toe shoes.  When using a stepladder, make sure it is fully opened and both spreaders are firmly locked. Do not climb a closed stepladder.  Add color or contrast paint or tape to grab bars and handrails in your home. Place contrasting color strips  on first and last steps.  Learn and use mobility aids as needed. Install an electrical emergency response system.  Turn on lights to avoid dark areas. Replace light bulbs that burn out immediately. Get light switches that glow.  Arrange furniture to create clear pathways. Keep furniture in the same place.  Firmly attach carpet with non-skid or double-sided tape.  Eliminate uneven floor surfaces.  Select a carpet pattern that does not visually hide the edge of steps.  Be aware of all pets. OTHER HOME SAFETY TIPS  Set the water temperature for 120 F (48.8 C).  Keep emergency numbers on or near the telephone.  Keep smoke detectors on every level of the home and near sleeping areas. Document Released: 12/30/2001 Document Revised: 07/11/2011 Document Reviewed: 03/31/2011 [facility] Patient Information 2015 [place], [place]. This information  is not intended to replace advice given to you by your health care provider. Make sure you discuss any questions you have with your health care provider.

## 2013-10-08 NOTE — ED Notes (Addendum)
Pt in with family reporting that patient fell while getting out of a chair last night, did not hit his head, c/o pain to left shoulder blade and left posterior hip area, bruising noted to both places, pt has ambulated this morning with his walker but c/o pain with movent

## 2013-10-08 NOTE — Progress Notes (Signed)
ED CM consulted by E. Azerbaijan PA-C concerning recommendation for [facility] services. Patient presented to [facility] ED with generalized weakness s/p fall. Patient has a significant pmh along with a history of chronic fatigue ongoing for the past  2 years. Patient is legally blind and HOH lives at home with his elderly wife as care giver. Patient evaluated in the ED unremarkable findings.  Met with patient wife and daughter at bedside. Discussed the recommendation for [facility] PT/OT services, explained the services provided by [facility] and covered by Medicare. Family agreeable to [facility] services. Choice offered [facility] list given., family selected [facility]. Referral faxed to [phone] fax confirmation received. Explained that an [facility] representative will be contacting the family at the verified number to arrange the initial assessment visit. Patient and family verbalized understand and appreciation for assistance. Teach back done. Provided family with [facility] brochure with contact information and my contact information for any additional questions or concerns that should arise. No further ED CM needs identified.

## 2013-10-08 NOTE — ED Provider Notes (Signed)
CSN: [id number]     Arrival date & time 10/08/13  1219 History   First MD Initiated Contact with Patient 10/08/13 1621     Chief Complaint  Patient presents with  . Fall     (Consider location/radiation/quality/duration/timing/severity/associated sxs/prior Treatment) The history is provided by the patient, the spouse and a relative.     Patient brought in by family for fall down the stairs last night.  Pt has chronic generalized weakness and fatigue that has been ongoing for 2 years, thought to be from central and obstructive apnea.  Also has hx TIAs, is hard of hearing, legally blind.  Last night he was going up the stairs and missed a step, falling backwards.  His wife caught him and was able to slow his fall, he did not hit his head.  Reports soreness in his left upper back.  Family notes he was not walking very well this morning and also has had decreased urination today (but also decreased PO fluid intake).  Was confused last night, but wife notes this happens on occasion and has happened several times in the past week.   Denies fever, recent illness, denies lightheadedness/dizziness, balance issues, focal weakness or numbness of the extremities.   Has PT 3 times/week, no other home health services.   PCP Dr Asa Lente.  They have an appointment with her on Monday.    Past Medical History  Diagnosis Date  . Adenomatous colon polyp 03/1993  . Prostate cancer   . Depression   . OSA (obstructive sleep apnea)   . Hearing loss     wears hearing aides bilaterally  . Atrial fibrillation     not anticoag candidate due to decond and falls  . Legally blind 1987  . AORTIC STENOSIS     s/p AVR 01/2010  . CLOSTRIDIUM DIFFICILE COLITIS 12/30/2009  . CORONARY ARTERY DISEASE     s/p CABG 01/28/10  . HIP REPLACEMENT, RIGHT, HX OF 2006  . HYPERTROPHY PROSTATE W/UR OBST & OTH LUTS   . MRSA infection 04/2010  . Gout   . THROMBOCYTOPENIA     likely ITP with CMML (bone marrow bx 2011)  . Arthritis     . DEGENERATIVE DISC DISEASE   . Diabetes mellitus, type 2   . GERD   . Hyperlipidemia   . Hypertension   . MRSA infection greater than 3 months ago   . CMML (chronic myelomonocytic leukemia)    Past Surgical History  Procedure Laterality Date  . Coronary artery bypass graft  01/28/2010    x3  . Aortic valve replacement  01/28/2010  . Prostatectomy  1993  . Hernia repair  03/15/2009    left  . Anal sphincterotomy  1996  . Total hip arthroplasty  2006    left  . Cataract extraction, bilateral      4/11 & 5/11  . Cholecystectomy  2011   Family History  Problem Relation Age of Onset  . Heart disease Mother   . Lung cancer Mother   . Hypertension Brother   . Alcohol abuse Father   . Sudden death Father   . Aneurysm Father   . Heart attack Brother   . Gout Brother   . Arthritis Brother   . Arthritis Other   . Gout Other    History  Substance Use Topics  . Smoking status: Former Smoker -- 1.00 packs/day for 2 years    Types: Cigars    Quit date: 07/01/2009  . Smokeless tobacco:  Never Used     Comment: Married, lives with spouse-2 kids. retired Geophysicist/field seismologist  . Alcohol Use: Not on file    Review of Systems  All other systems reviewed and are negative.     Allergies  Doxycycline; Septra; and Morphine  Home Medications   Prior to Admission medications   Medication Sig Start Date End Date Taking? Authorizing Provider  acetaminophen (TYLENOL) 500 MG tablet Take 650 mg by mouth 2 (two) times daily as needed for pain.     Historical Provider, MD  allopurinol (ZYLOPRIM) 300 MG tablet Take 1 tablet by mouth at  bedtime 09/19/12   Rowe Clack, MD  Ascorbic Acid (VITAMIN C PO) Take 1 tablet by mouth every morning.      Historical Provider, MD  CALCIUM PO Take 1 tablet by mouth every morning.      Historical Provider, MD  cetirizine (ZYRTEC ALLERGY) 10 MG tablet Take 10 mg by mouth daily.    Historical Provider, MD  Cholecalciferol (VITAMIN D-3 PO) Take 2,000 Units  by mouth daily.      Historical Provider, MD  diltiazem (CARDIZEM CD) 180 MG 24 hr capsule Take 1 capsule (180 mg total) by mouth every evening. 09/19/12   Rowe Clack, MD  docusate sodium (COLACE) 100 MG capsule Take 400 mg by mouth daily.     Historical Provider, MD  DULoxetine (CYMBALTA) 60 MG capsule Take 1 capsule (60 mg total) by mouth daily. 07/10/13   Rowe Clack, MD  furosemide (LASIX) 40 MG tablet Take 0.5 tablets (20 mg total) by mouth daily. 09/19/12   Rowe Clack, MD  metoprolol tartrate (LOPRESSOR) 25 MG tablet Take 1 tablet (25 mg total) by mouth 2 (two) times daily. 09/19/12   Rowe Clack, MD  Multiple Vitamins-Minerals (ICAPS MV PO) Take 1 tablet by mouth every morning.      Historical Provider, MD  omeprazole (PRILOSEC) 20 MG capsule Take 20 mg by mouth daily.  11/07/12   Historical Provider, MD  potassium chloride (K-DUR) 10 MEQ tablet Take 1 tablet (10 mEq total) by mouth daily. 09/19/12   Rowe Clack, MD  Pyridoxine HCl (VITAMIN B-6 PO) Take 1 tablet by mouth every morning.      Historical Provider, MD  ranitidine (ZANTAC) 150 MG tablet Take 0.5 tablets (75 mg total) by mouth every morning. 09/19/12   Rowe Clack, MD  rOPINIRole (REQUIP) 0.25 MG tablet Take 3 tablets by mouth at  bedtime 09/19/12   Rowe Clack, MD  rosuvastatin (CRESTOR) 10 MG tablet Take 1 tablet (10 mg total) by mouth at bedtime. 09/19/12   Rowe Clack, MD  solifenacin (VESICARE) 5 MG tablet Take 5 mg by mouth daily.    Historical Provider, MD  Thiamine HCl (VITAMIN B-1 PO) Take 1 tablet by mouth every morning.      Historical Provider, MD   BP 129/47  Pulse 58  Temp(Src) 97.5 F (36.4 C) (Oral)  Resp 16  SpO2 100% Physical Exam  Nursing note and vitals reviewed. Constitutional: He appears well-developed and well-nourished. No distress.  HENT:  Head: Normocephalic and atraumatic.  Neck: Neck supple.  Cardiovascular: Normal rate, regular rhythm and  intact distal pulses.   Murmur heard. Pulmonary/Chest: Effort normal and breath sounds normal. No respiratory distress. He has no wheezes. He has no rales.  Abdominal: Soft. He exhibits no distension and no mass. There is no tenderness. There is no rebound and no guarding.  Musculoskeletal:  Right hip: Normal.       Left hip: Normal.       Arms: Strength and sensation equal throughout upper and lower extremities.   Neurological: He is alert. He exhibits normal muscle tone.  Skin: He is not diaphoretic.    ED Course  Procedures (including critical care time) Labs Review Labs Reviewed  CBC WITH DIFFERENTIAL - Abnormal; Notable for the following:    RBC 4.14 (*)    HCT 38.0 (*)    Platelets 83 (*)    Monocytes Relative 30 (*)    Monocytes Absolute 1.7 (*)    All other components within normal limits  COMPREHENSIVE METABOLIC PANEL - Abnormal; Notable for the following:    GFR calc non Af Amer 54 (*)    GFR calc Af Amer 63 (*)    All other components within normal limits  URINALYSIS, ROUTINE W REFLEX MICROSCOPIC    Imaging Review Dg Chest 2 View  10/08/2013   CLINICAL DATA:  Fall. Atrial fibrillation. History of hypertension, CAD.  EXAM: CHEST  2 VIEW  COMPARISON:  05/25/2012  FINDINGS: Patient has had median sternotomy and CABG. Heart is normal in size. Perihilar peribronchial thickening is noted. There are no focal consolidations or pleural effusions. No pneumothorax or acute displaced fractures.  IMPRESSION: No active cardiopulmonary disease.   Electronically Signed   By: Shon Hale M.D.   On: 10/08/2013 18:40   Dg Hip Complete Left  10/08/2013   CLINICAL DATA:  Left hip pain following fall  EXAM: LEFT HIP - COMPLETE 2+ VIEW  COMPARISON:  07/19/2004  FINDINGS: A left hip replacement is noted. The overall appearance is stable from the prior exam. No dislocation is seen. The pelvic ring is intact. No other fracture is noted.  IMPRESSION: Postsurgical changes are seen.  No acute  abnormality is noted.   Electronically Signed   By: Inez Catalina M.D.   On: 10/08/2013 14:58   Ct Head Wo Contrast  10/08/2013   CLINICAL DATA:  Fall out of chair.  EXAM: CT HEAD WITHOUT CONTRAST  TECHNIQUE: Contiguous axial images were obtained from the base of the skull through the vertex without intravenous contrast.  COMPARISON:  MRI on 06/06/2012 and prior CT on 12/09/2009.  FINDINGS: Diffuse atrophy present as well as advanced small vessel ischemic changes in the periventricular white matter and pons and old lacunar infarcts in bilateral basal ganglia. The brain demonstrates no evidence of hemorrhage, acute infarction, edema, mass effect, extra-axial fluid collection, hydrocephalus or mass lesion. The skull is unremarkable.  IMPRESSION: No acute findings. Advanced small vessel disease and old lacunar infarcts.   Electronically Signed   By: Aletta Edouard M.D.   On: 10/08/2013 19:32   Dg Shoulder Left  10/08/2013   CLINICAL DATA:  Fall, left shoulder pain  EXAM: LEFT SHOULDER - 2+ VIEW  COMPARISON:  None.  FINDINGS: Limited exam with cross-table views. Glenohumeral joint is intact. No evidence of scapular fracture or humeral fracture.  IMPRESSION: No acute osseous abnormality.   Electronically Signed   By: Suzy Bouchard M.D.   On: 10/08/2013 14:56     EKG Interpretation None      7:53 PM Patient and family to be seen by case manager for possible home health assistance.   MDM   Final diagnoses:  Fall, initial encounter  Bruising    Pt with chronic weakness and fatigue presents after  mechanical fall last night.  Has small areas of bruising over left shoulder  and left hip.  Xrays are negative.   Concern though the symptoms are chronic that he may be slightly more weak and more fatigued today.  No focal weakness.  CBC, CMP, UA, CXR, Head CT unremarkable. Pt on aspirin for replaced aortic valve, also known to be chronically thrombocytopenic. Family agreed to home health services,  arranged by case management.  Pt will follow up with PCP Dr Asa Lente at previously scheduled appointment next week.  Discussed result, findings, treatment, and follow up  with patient.  Pt given return precautions.  Pt verbalizes understanding and agrees with plan.        Clayton Bibles, PA-C 10/08/13 2030

## 2013-10-08 NOTE — ED Notes (Signed)
Pt still unable to pee.

## 2013-10-09 NOTE — ED Provider Notes (Signed)
Medical screening examination/treatment/procedure(s) were performed by non-physician practitioner and as supervising physician I was immediately available for consultation/collaboration.   Dot Lanes, MD 10/09/13 [phone]

## 2013-10-13 ENCOUNTER — Ambulatory Visit: Payer: Medicare Other | Admitting: Internal Medicine

## 2013-10-13 ENCOUNTER — Telehealth: Payer: Self-pay | Admitting: Internal Medicine

## 2013-10-13 MED ORDER — SOLIFENACIN SUCCINATE 10 MG PO TABS: 10.0000 mg | ORAL_TABLET | Freq: Every day | ORAL | Status: DC

## 2013-10-13 NOTE — Telephone Encounter (Signed)
Wife here for appointment - concerned about pt recent fall with Ed eval -  ?increaseing symptoms of lumbar spinal stenosis -  Refer back to dr. Maia Petties who has done [facility] before Continue [facility] PT/OT as ordered Will NOT be able to partake of vacation plans 10/3;15 - will write medical letter stating same (for travel insurance)

## 2013-10-20 ENCOUNTER — Telehealth: Payer: Self-pay | Admitting: Internal Medicine

## 2013-10-20 NOTE — Telephone Encounter (Signed)
Beth with [facility] called making sure statement of medical necessity has been received.  Please call her with any questions at [phone] ext [phone]

## 2013-10-23 ENCOUNTER — Other Ambulatory Visit: Payer: Self-pay | Admitting: Orthopaedic Surgery

## 2013-10-23 DIAGNOSIS — M4310 Spondylolisthesis, site unspecified: Secondary | ICD-10-CM

## 2013-10-27 DIAGNOSIS — Z0279 Encounter for issue of other medical certificate: Secondary | ICD-10-CM

## 2013-10-31 ENCOUNTER — Ambulatory Visit
Admission: RE | Admit: 2013-10-31 | Discharge: 2013-10-31 | Disposition: A | Discharge status: Home / Self Care | Payer: Medicare Other | Source: Ambulatory Visit | Attending: Orthopaedic Surgery | Admitting: Orthopaedic Surgery

## 2013-10-31 DIAGNOSIS — M4310 Spondylolisthesis, site unspecified: Secondary | ICD-10-CM

## 2013-11-07 ENCOUNTER — Telehealth: Payer: Self-pay | Admitting: Internal Medicine

## 2013-11-07 ENCOUNTER — Other Ambulatory Visit: Payer: Self-pay

## 2013-11-07 NOTE — Telephone Encounter (Signed)
Diane from [facility] wanted to let Dr Asa Lente know pt missed OT appt.

## 2013-11-10 ENCOUNTER — Other Ambulatory Visit: Payer: Self-pay | Admitting: Internal Medicine

## 2013-11-10 ENCOUNTER — Other Ambulatory Visit: Payer: Self-pay

## 2013-11-10 ENCOUNTER — Encounter: Payer: Self-pay | Admitting: Internal Medicine

## 2013-11-10 MED ORDER — DILTIAZEM HCL ER COATED BEADS 180 MG PO CP24: 180.0000 mg | ORAL_CAPSULE | Freq: Every evening | ORAL | Status: DC

## 2013-11-10 NOTE — Telephone Encounter (Signed)
Noted No order changes thanks

## 2013-11-11 ENCOUNTER — Ambulatory Visit (HOSPITAL_BASED_OUTPATIENT_CLINIC_OR_DEPARTMENT_OTHER): Payer: Medicare Other | Attending: Adult Health | Admitting: Radiology

## 2013-11-11 ENCOUNTER — Other Ambulatory Visit: Payer: Self-pay

## 2013-11-11 VITALS — Ht 70.0 in | Wt 213.0 lb

## 2013-11-11 DIAGNOSIS — Z6830 Body mass index (BMI) 30.0-30.9, adult: Secondary | ICD-10-CM | POA: Insufficient documentation

## 2013-11-11 DIAGNOSIS — G4733 Obstructive sleep apnea (adult) (pediatric): Secondary | ICD-10-CM

## 2013-11-11 DIAGNOSIS — G4730 Sleep apnea, unspecified: Secondary | ICD-10-CM | POA: Diagnosis not present

## 2013-11-11 DIAGNOSIS — G4710 Hypersomnia, unspecified: Secondary | ICD-10-CM | POA: Diagnosis present

## 2013-11-11 MED ORDER — RANITIDINE HCL 150 MG PO TABS: 75.0000 mg | ORAL_TABLET | ORAL | Status: DC

## 2013-11-11 MED ORDER — POTASSIUM CHLORIDE ER 10 MEQ PO TBCR: 10.0000 meq | EXTENDED_RELEASE_TABLET | Freq: Every day | ORAL | Status: DC

## 2013-11-11 MED ORDER — METOPROLOL TARTRATE 25 MG PO TABS: 25.0000 mg | ORAL_TABLET | Freq: Two times a day (BID) | ORAL | Status: DC

## 2013-11-13 ENCOUNTER — Other Ambulatory Visit: Payer: Self-pay

## 2013-11-13 MED ORDER — OMEPRAZOLE 20 MG PO CPDR: DELAYED_RELEASE_CAPSULE | ORAL | Status: DC

## 2013-11-14 ENCOUNTER — Telehealth: Payer: Self-pay | Admitting: Pulmonary Disease

## 2013-11-14 DIAGNOSIS — G4733 Obstructive sleep apnea (adult) (pediatric): Secondary | ICD-10-CM

## 2013-11-14 NOTE — Sleep Study (Signed)
   NAME: [name] DATE OF BIRTH:  08-12-1927 MEDICAL RECORD NUMBER [id number]  [facility]  PHYSICIAN: Kathee Delton  DATE OF STUDY: 11/11/2013  SLEEP STUDY TYPE: Nocturnal Polysomnogram               REFERRING PHYSICIAN: Parrett, Tammy S, NP  INDICATION FOR STUDY: Hypersomnia with sleep apnea  EPWORTH SLEEPINESS SCORE:  20 HEIGHT: 5\' 10"  (177.8 cm)  WEIGHT: 213 lb (96.616 kg)    Body mass index is 30.56 kg/(m^2).  NECK SIZE: 16.5 in.  MEDICATIONS: Reviewed in the sleep record  SLEEP ARCHITECTURE: The patient had a total sleep time of 251 minutes with no slow-wave sleep and 50 minutes of REM.  Sleep onset latency was normal at 35 minutes, and REM onset was prolonged at 199 minutes. Sleep efficiency was poor at 62%  RESPIRATORY DATA: The patient underwent a bilevel titration study with a large ResMed air fit F10 full face mask.  The pressure was increased for control of both obstructive and central events, as well as snoring. The patient was found to have excellent control of his complex apnea at a pressure of 18/14, including during supine REM.  OXYGEN DATA: There was transient oxygen desaturation as low as 91% with the patient's obstructive events  CARDIAC DATA: Occasional PAC noted  MOVEMENT/PARASOMNIA: Large numbers of leg jerks seen at the end of the study that were not periodic in nature. There were no abnormal behaviors seen.  IMPRESSION/ RECOMMENDATION:    1) bilevel titration study reveals good control of the patient's complex sleep apnea with a bilevel pressure of 18/14, and delivered by a large ResMed air fit F10 full face mask. The patient should also be encouraged to work aggressively on weight loss.  2) occasional PAC noted, but no clinically significant arrhythmias were seen.    [place], American Board of Sleep Medicine  ELECTRONICALLY SIGNED ON:  11/14/2013, 6:05 PM [place] PH: [phone]   FX: [phone] [place]

## 2013-11-14 NOTE — Telephone Encounter (Signed)
LMTCB

## 2013-11-17 ENCOUNTER — Other Ambulatory Visit: Payer: Self-pay

## 2013-11-17 MED ORDER — POTASSIUM CHLORIDE ER 10 MEQ PO TBCR: 10.0000 meq | EXTENDED_RELEASE_TABLET | Freq: Every day | ORAL | Status: DC

## 2013-11-17 MED ORDER — DILTIAZEM HCL ER COATED BEADS 180 MG PO CP24: 180.0000 mg | ORAL_CAPSULE | Freq: Every evening | ORAL | Status: DC

## 2013-11-17 MED ORDER — DULOXETINE HCL 60 MG PO CPEP: 60.0000 mg | ORAL_CAPSULE | Freq: Every day | ORAL | Status: DC

## 2013-11-17 MED ORDER — ROPINIROLE HCL 0.25 MG PO TABS: ORAL_TABLET | ORAL | Status: DC

## 2013-11-17 MED ORDER — ALLOPURINOL 300 MG PO TABS: ORAL_TABLET | ORAL | Status: DC

## 2013-11-17 MED ORDER — ROSUVASTATIN CALCIUM 10 MG PO TABS: 10.0000 mg | ORAL_TABLET | Freq: Every day | ORAL | Status: DC

## 2013-11-17 MED ORDER — METOPROLOL TARTRATE 25 MG PO TABS: 25.0000 mg | ORAL_TABLET | Freq: Two times a day (BID) | ORAL | Status: DC

## 2013-11-17 MED ORDER — RANITIDINE HCL 150 MG PO TABS: 75.0000 mg | ORAL_TABLET | ORAL | Status: DC

## 2013-11-17 MED ORDER — SOLIFENACIN SUCCINATE 10 MG PO TABS: 10.0000 mg | ORAL_TABLET | Freq: Every day | ORAL | Status: DC

## 2013-11-17 NOTE — Telephone Encounter (Signed)
lmtcb for pt.  

## 2013-11-18 NOTE — Telephone Encounter (Signed)
Wife returning call.  [phone]

## 2013-11-18 NOTE — Telephone Encounter (Signed)
Called spoke with pt's spouse [place].  Pt had titration study done on 10/20 - would like appt moved up from 11/30 with RA.  No sooner openings w/ RA.  Spouse requesting to see TP.  Appt scheduled for 11.3.15 @ 3:15pm\  Nothing further needed at this time; will sign off.

## 2013-11-19 ENCOUNTER — Encounter: Payer: Self-pay | Admitting: Internal Medicine

## 2013-11-19 ENCOUNTER — Telehealth: Payer: Self-pay | Admitting: *Deleted

## 2013-11-19 NOTE — Telephone Encounter (Signed)
Ok for BID (not assoc with meals)

## 2013-11-19 NOTE — Telephone Encounter (Signed)
Left msg on triage wanting to clarify prilosec. Should be taking before or after meal. When calling back use ref # [id number]...[name]

## 2013-11-20 NOTE — Telephone Encounter (Signed)
Called [facility] spoke with [name]/pharmacist ok to take after breakfast per md not associated with meals...[name]

## 2013-11-23 ENCOUNTER — Encounter: Payer: Self-pay | Admitting: Internal Medicine

## 2013-11-24 ENCOUNTER — Telehealth: Payer: Self-pay | Admitting: Internal Medicine

## 2013-11-24 DIAGNOSIS — R29898 Other symptoms and signs involving the musculoskeletal system: Secondary | ICD-10-CM

## 2013-11-24 NOTE — Telephone Encounter (Signed)
Is requesting referral to neurologist.  States patient was referred to spine specialist.  Dr. Maia Petties states patient needed to see neurologist instead.

## 2013-11-25 ENCOUNTER — Encounter: Payer: Self-pay | Admitting: Adult Health

## 2013-11-25 ENCOUNTER — Ambulatory Visit (INDEPENDENT_AMBULATORY_CARE_PROVIDER_SITE_OTHER): Payer: Medicare Other | Admitting: Geriatric Medicine

## 2013-11-25 ENCOUNTER — Ambulatory Visit (INDEPENDENT_AMBULATORY_CARE_PROVIDER_SITE_OTHER): Payer: Medicare Other | Admitting: Adult Health

## 2013-11-25 ENCOUNTER — Ambulatory Visit: Payer: Medicare Other

## 2013-11-25 VITALS — BP 126/60 | HR 59 | Temp 97.3°F | Ht 70.0 in | Wt 215.0 lb

## 2013-11-25 DIAGNOSIS — I2510 Atherosclerotic heart disease of native coronary artery without angina pectoris: Secondary | ICD-10-CM

## 2013-11-25 DIAGNOSIS — G4733 Obstructive sleep apnea (adult) (pediatric): Secondary | ICD-10-CM

## 2013-11-25 DIAGNOSIS — Z23 Encounter for immunization: Secondary | ICD-10-CM

## 2013-11-25 NOTE — Telephone Encounter (Signed)
Will refer to neuro per request

## 2013-11-25 NOTE — Patient Instructions (Signed)
Continue on BIPAP At bedtime  And with naps Keep working on weight loss.  Follow up with Dr. Elsworth Soho  In 6-8 weeks and As needed

## 2013-11-25 NOTE — Progress Notes (Signed)
Subjective:    Patient ID: [name], male    DOB: 13-Jan-1928, 80 y.o.   MRN: [id number]  HPI 86/M for FU of obstructive sleep apnea & excessive somnolence / restless legs  Oct 2011 >> staph sepsis  Nov 2011 >> c .diff colitis, low plts  Underwent CABG x3 Pig AVR in jan '12, in cardiac rehab  H/o Monocytosis, thrombocytopenia - ? Idiopathic, neg BM biopsy ([place])  He had remote sleep study in [place], another Study in 2005  On VPAP auto , mirage full face mask, pr increased to 12 cm in jan'12 after download check  Download 9/17-05/02/10 shows avg pr 15 cm, AHI 14.4 residual with predominant hypopneas, some leak & excellent compliance >> Pr increased to 17 cm  Reviewed ONO on CPAP >> no desaturation , Does not need O2 Download 8/2 -09/07/10 showed avg pressure 11 cm, avg usage 5 h, minimal leak  09/18/2011 VPAP auto download shows residual AHI 9/h, leak ++, excellent usage on 17 cm (14 on expiration)    Called stating patients VPAP is stuck in EPAP mode and patient has over 21,000 hours on machine. He has Medicare and therefore will need face to face with RA before getting new VPAP  06/23/13  Download on 17 cm, no residuals, does not document usage accurately Pr ok,mask ok, no leak bedtime 10p, minimal latency, Nocturia + since prostate sx, oob at 0930 , feels refreshed , no headaches, daytime naps +   09/22/13 Follow up OSA  Pt returns for 3 month follow up for OSA Started on BIPAP last ov.  Wearing BIPAP around 10-12 hrs .  Naps on/off during daytime. Could go to sleep anytime. No very active. PT helps him 3 x/wk, water aerobics weekly. Yoga weekly ,  -only with PT. Legally blind due to stroke.  Has had several CVA/TIA in past  Echo 06/2013 showed EF 55-60% .  Download shows good compliance with 100% usage, 90% >4 hr each night.  Continued Central and Obstructive events with AHI ~38 .  No wt loss.  Lives at home with wife. Married 59 yrs . She helps with his care.  No fever,  chest pain, orthopnea, edema .   11/25/2013 Follow OSA  Returns for follow up for sleep apnea.  Underwent BIPAP titration study on 11/11/13 that  Showed good control with resolution of central apneas.  He still has day time symptoms of sleepiness .  Wears BIPAP everynight .  Tolerates mask well.  No chest pain , increased leg swelling , edema or fever.    Past Medical History  Diagnosis Date  . Adenomatous colon polyp 03/1993  . Prostate cancer   . Depression   . OSA (obstructive sleep apnea)   . Hearing loss     wears hearing aides bilaterally  . Atrial fibrillation     not anticoag candidate due to decond and falls  . Legally blind 1987  . AORTIC STENOSIS     s/p AVR 01/2010  . CLOSTRIDIUM DIFFICILE COLITIS 12/30/2009  . CORONARY ARTERY DISEASE     s/p CABG 01/28/10  . HIP REPLACEMENT, RIGHT, HX OF 2006  . HYPERTROPHY PROSTATE W/UR OBST & OTH LUTS   . MRSA infection 04/2010  . Gout   . THROMBOCYTOPENIA     likely ITP with CMML (bone marrow bx 2011)  . Arthritis   . DEGENERATIVE DISC DISEASE   . Diabetes mellitus, type 2   . GERD   . Hyperlipidemia   .  Hypertension   . MRSA infection greater than 3 months ago   . CMML (chronic myelomonocytic leukemia)      Review of Systems  Pt denies any significant  nasal congestion or excess secretions, fever, chills, sweats, unintended wt loss, pleuritic or exertional cp, orthopnea pnd or leg swelling.  Pt also denies any obvious fluctuation in symptoms with weather or environmental change or other alleviating or aggravating factors.    Pt denies any increase in rescue therapy over baseline, denies waking up needing it or having early am exacerbations or coughing/wheezing/ or dyspnea      Objective:   Physical Exam   Gen. Pleasant, chronically ill appearing  ENT - no lesions, no post nasal drip, legally blind  Neck: No JVD, no thyromegaly, no carotid bruits Lungs: no use of accessory muscles, no dullness to percussion, clear  without rales or rhonchi  Cardiovascular: Rhythm regular, heart sounds  normal, no murmurs or gallops, tr peripheral edema Musculoskeletal: No deformities, no cyanosis or clubbing        Assessment & Plan:

## 2013-11-26 ENCOUNTER — Other Ambulatory Visit: Payer: Self-pay

## 2013-11-26 MED ORDER — DULOXETINE HCL 30 MG PO CPEP: 30.0000 mg | ORAL_CAPSULE | Freq: Every day | ORAL | Status: DC

## 2013-11-26 NOTE — Telephone Encounter (Signed)
Called pt wife and informed that MD did receive the note that was left.  Indicated the referral has been made and wife confirmed that there was an appointment for the pt to go in December.  Also informed that new dosage for Cymbalta was sent in to [facility].

## 2013-11-27 ENCOUNTER — Emergency Department (HOSPITAL_COMMUNITY): Payer: Medicare Other

## 2013-11-27 ENCOUNTER — Encounter (HOSPITAL_COMMUNITY): Payer: Self-pay | Admitting: Emergency Medicine

## 2013-11-27 ENCOUNTER — Emergency Department (HOSPITAL_COMMUNITY)
Admission: EM | Admit: 2013-11-27 | Discharge: 2013-11-27 | Disposition: A | Discharge status: Home / Self Care | Payer: Medicare Other | Attending: Emergency Medicine | Admitting: Emergency Medicine

## 2013-11-27 ENCOUNTER — Encounter: Payer: Self-pay | Admitting: Cardiovascular Disease

## 2013-11-27 DIAGNOSIS — Z856 Personal history of leukemia: Secondary | ICD-10-CM | POA: Insufficient documentation

## 2013-11-27 DIAGNOSIS — I10 Essential (primary) hypertension: Secondary | ICD-10-CM | POA: Diagnosis not present

## 2013-11-27 DIAGNOSIS — Z9981 Dependence on supplemental oxygen: Secondary | ICD-10-CM | POA: Insufficient documentation

## 2013-11-27 DIAGNOSIS — Z791 Long term (current) use of non-steroidal anti-inflammatories (NSAID): Secondary | ICD-10-CM | POA: Insufficient documentation

## 2013-11-27 DIAGNOSIS — Z8619 Personal history of other infectious and parasitic diseases: Secondary | ICD-10-CM | POA: Diagnosis not present

## 2013-11-27 DIAGNOSIS — R4182 Altered mental status, unspecified: Secondary | ICD-10-CM | POA: Diagnosis not present

## 2013-11-27 DIAGNOSIS — Z86010 Personal history of colonic polyps: Secondary | ICD-10-CM | POA: Diagnosis not present

## 2013-11-27 DIAGNOSIS — M109 Gout, unspecified: Secondary | ICD-10-CM | POA: Insufficient documentation

## 2013-11-27 DIAGNOSIS — Z8614 Personal history of Methicillin resistant Staphylococcus aureus infection: Secondary | ICD-10-CM | POA: Insufficient documentation

## 2013-11-27 DIAGNOSIS — Z951 Presence of aortocoronary bypass graft: Secondary | ICD-10-CM | POA: Insufficient documentation

## 2013-11-27 DIAGNOSIS — E119 Type 2 diabetes mellitus without complications: Secondary | ICD-10-CM | POA: Diagnosis not present

## 2013-11-27 DIAGNOSIS — D696 Thrombocytopenia, unspecified: Secondary | ICD-10-CM | POA: Diagnosis not present

## 2013-11-27 DIAGNOSIS — R531 Weakness: Secondary | ICD-10-CM | POA: Diagnosis present

## 2013-11-27 DIAGNOSIS — M1990 Unspecified osteoarthritis, unspecified site: Secondary | ICD-10-CM | POA: Diagnosis not present

## 2013-11-27 DIAGNOSIS — Z954 Presence of other heart-valve replacement: Secondary | ICD-10-CM | POA: Insufficient documentation

## 2013-11-27 DIAGNOSIS — Z96641 Presence of right artificial hip joint: Secondary | ICD-10-CM | POA: Diagnosis not present

## 2013-11-27 DIAGNOSIS — Z9889 Other specified postprocedural states: Secondary | ICD-10-CM | POA: Diagnosis not present

## 2013-11-27 DIAGNOSIS — M6281 Muscle weakness (generalized): Secondary | ICD-10-CM | POA: Insufficient documentation

## 2013-11-27 DIAGNOSIS — R262 Difficulty in walking, not elsewhere classified: Secondary | ICD-10-CM | POA: Insufficient documentation

## 2013-11-27 DIAGNOSIS — Z79899 Other long term (current) drug therapy: Secondary | ICD-10-CM | POA: Insufficient documentation

## 2013-11-27 DIAGNOSIS — I2510 Atherosclerotic heart disease of native coronary artery without angina pectoris: Secondary | ICD-10-CM | POA: Diagnosis not present

## 2013-11-27 DIAGNOSIS — H9190 Unspecified hearing loss, unspecified ear: Secondary | ICD-10-CM | POA: Diagnosis not present

## 2013-11-27 DIAGNOSIS — G4733 Obstructive sleep apnea (adult) (pediatric): Secondary | ICD-10-CM | POA: Diagnosis not present

## 2013-11-27 DIAGNOSIS — K219 Gastro-esophageal reflux disease without esophagitis: Secondary | ICD-10-CM | POA: Insufficient documentation

## 2013-11-27 DIAGNOSIS — E785 Hyperlipidemia, unspecified: Secondary | ICD-10-CM | POA: Insufficient documentation

## 2013-11-27 DIAGNOSIS — Z7982 Long term (current) use of aspirin: Secondary | ICD-10-CM | POA: Diagnosis not present

## 2013-11-27 DIAGNOSIS — I4891 Unspecified atrial fibrillation: Secondary | ICD-10-CM | POA: Diagnosis not present

## 2013-11-27 DIAGNOSIS — H548 Legal blindness, as defined in USA: Secondary | ICD-10-CM | POA: Insufficient documentation

## 2013-11-27 DIAGNOSIS — E663 Overweight: Secondary | ICD-10-CM | POA: Insufficient documentation

## 2013-11-27 DIAGNOSIS — Z8546 Personal history of malignant neoplasm of prostate: Secondary | ICD-10-CM | POA: Insufficient documentation

## 2013-11-27 DIAGNOSIS — Z87891 Personal history of nicotine dependence: Secondary | ICD-10-CM | POA: Insufficient documentation

## 2013-11-27 LAB — COMPREHENSIVE METABOLIC PANEL
ALT: 21 U/L (ref 0–53)
AST: 19 U/L (ref 0–37)
Albumin: 4.2 g/dL (ref 3.5–5.2)
Alkaline Phosphatase: 110 U/L (ref 39–117)
Anion gap: 12 (ref 5–15)
BUN: 11 mg/dL (ref 6–23)
CALCIUM: 9.7 mg/dL (ref 8.4–10.5)
CHLORIDE: 102 meq/L (ref 96–112)
CO2: 27 meq/L (ref 19–32)
Creatinine, Ser: 0.96 mg/dL (ref 0.50–1.35)
GFR calc Af Amer: 86 mL/min — ABNORMAL LOW (ref >90)
GFR, EST NON AFRICAN AMERICAN: 74 mL/min — AB (ref >90)
Glucose, Bld: 103 mg/dL — ABNORMAL HIGH (ref 70–99)
Potassium: 4.5 mEq/L (ref 3.7–5.3)
Sodium: 141 mEq/L (ref 137–147)
Total Bilirubin: 0.6 mg/dL (ref 0.3–1.2)
Total Protein: 7.2 g/dL (ref 6.0–8.3)

## 2013-11-27 LAB — CBC
HCT: 42.3 % (ref 39.0–52.0)
Hemoglobin: 14.6 g/dL (ref 13.0–17.0)
MCH: 32.8 pg (ref 26.0–34.0)
MCHC: 34.5 g/dL (ref 30.0–36.0)
MCV: 95.1 fL (ref 78.0–100.0)
Platelets: 86 10*3/uL — ABNORMAL LOW (ref 150–400)
RBC: 4.45 MIL/uL (ref 4.22–5.81)
RDW: 14.1 % (ref 11.5–15.5)
WBC: 3.7 10*3/uL — AB (ref 4.0–10.5)

## 2013-11-27 LAB — I-STAT ARTERIAL BLOOD GAS, ED
Acid-Base Excess: 2 mmol/L (ref 0.0–2.0)
Bicarbonate: 26.7 mEq/L — ABNORMAL HIGH (ref 20.0–24.0)
O2 SAT: 95 %
PH ART: 7.418 (ref 7.350–7.450)
TCO2: 28 mmol/L (ref 0–100)
pCO2 arterial: 41.4 mmHg (ref 35.0–45.0)
pO2, Arterial: 72 mmHg — ABNORMAL LOW (ref 80.0–100.0)

## 2013-11-27 LAB — CBG MONITORING, ED
GLUCOSE-CAPILLARY: 91 mg/dL (ref 70–99)
Glucose-Capillary: 102 mg/dL — ABNORMAL HIGH (ref 70–99)

## 2013-11-27 LAB — URINALYSIS, ROUTINE W REFLEX MICROSCOPIC
Bilirubin Urine: NEGATIVE
GLUCOSE, UA: NEGATIVE mg/dL
Hgb urine dipstick: NEGATIVE
KETONES UR: NEGATIVE mg/dL
LEUKOCYTES UA: NEGATIVE
Nitrite: NEGATIVE
PH: 8 (ref 5.0–8.0)
Protein, ur: NEGATIVE mg/dL
SPECIFIC GRAVITY, URINE: 1.014 (ref 1.005–1.030)
Urobilinogen, UA: 1 mg/dL (ref 0.0–1.0)

## 2013-11-27 LAB — I-STAT CHEM 8, ED
BUN: 11 mg/dL (ref 6–23)
CALCIUM ION: 1.25 mmol/L (ref 1.13–1.30)
CHLORIDE: 102 meq/L (ref 96–112)
Creatinine, Ser: 1 mg/dL (ref 0.50–1.35)
Glucose, Bld: 106 mg/dL — ABNORMAL HIGH (ref 70–99)
HEMATOCRIT: 45 % (ref 39.0–52.0)
Hemoglobin: 15.3 g/dL (ref 13.0–17.0)
POTASSIUM: 4.4 meq/L (ref 3.7–5.3)
Sodium: 142 mEq/L (ref 137–147)
TCO2: 28 mmol/L (ref 0–100)

## 2013-11-27 LAB — TROPONIN I: Troponin I: <0.3 ng/mL (ref <0.30)

## 2013-11-27 NOTE — Discharge Instructions (Signed)
Encourage oral intake of food and fluids. The home health service, will contact you to arrange a time to see him for evaluation and initiation of additional treatments at home. Return here, if needed, for problems.   Altered Mental Status Altered mental status most often refers to an abnormal change in your responsiveness and awareness. It can affect your speech, thought, mobility, memory, attention span, or alertness. It can range from slight confusion to complete unresponsiveness (coma). Altered mental status can be a sign of a serious underlying medical condition. Rapid evaluation and medical treatment is necessary for patients having an altered mental status. CAUSES   Low blood sugar (hypoglycemia) or diabetes.  Severe loss of body fluids (dehydration) or a body salt (electrolyte) imbalance.  A stroke or other neurologic problem, such as dementia or delirium.  A head injury or tumor.  A drug or alcohol overdose.  Exposure to toxins or poisons.  Depression, anxiety, and stress.  A low oxygen level (hypoxia).  An infection.  Blood loss.  Twitching or shaking (seizure).  Heart problems, such as heart attack or heart rhythm problems (arrhythmias).  A body temperature that is too low or too high (hypothermia or hyperthermia). DIAGNOSIS  A diagnosis is based on your history, symptoms, physical and neurologic examinations, and diagnostic tests. Diagnostic tests may include:  Measurement of your blood pressure, pulse, breathing, and oxygen levels (vital signs).  Blood tests.  Urine tests.  X-ray exams.  A computerized magnetic scan (magnetic resonance imaging, MRI).  A computerized X-ray scan (computed tomography, CT scan). TREATMENT  Treatment will depend on the cause. Treatment may include:  Management of an underlying medical or mental health condition.  Critical care or support in the hospital. [place]   Only take over-the-counter or prescription  medicines for pain, discomfort, or fever as directed by your caregiver.  Manage underlying conditions as directed by your caregiver.  Eat a healthy, well-balanced diet to maintain strength.  Join a support group or prevention program to cope with the condition or trauma that caused the altered mental status. Ask your caregiver to help choose a program that works for you.  Follow up with your caregiver for further examination, therapy, or testing as directed. SEEK MEDICAL CARE IF:   You feel unwell or have chills.  You or your family notice a change in your behavior or your alertness.  You have trouble following your caregiver's treatment plan.  You have questions or concerns. SEEK IMMEDIATE MEDICAL CARE IF:   You have a rapid heartbeat or have chest pain.  You have difficulty breathing.  You have a fever.  You have a headache with a stiff neck.  You cough up blood.  You have blood in your urine or stool.  You have severe agitation or confusion. MAKE SURE YOU:   Understand these instructions.  Will watch your condition.  Will get help right away if you are not doing well or get worse. Document Released: 06/29/2009 Document Revised: 04/03/2011 Document Reviewed: 06/29/2009 [facility] Patient Information 2015 [place], [place]. This information is not intended to replace advice given to you by your health care provider. Make sure you discuss any questions you have with your health care provider.  Weakness Weakness is a lack of strength. It may be felt all over the body (generalized) or in one specific part of the body (focal). Some causes of weakness can be serious. You may need further medical evaluation, especially if you are elderly or you have a history  of immunosuppression (such as chemotherapy or HIV), kidney disease, heart disease, or diabetes. CAUSES  Weakness can be caused by many different things, including:  Infection.  Physical exhaustion.  Internal bleeding  or other blood loss that results in a lack of red blood cells (anemia).  Dehydration. This cause is more common in elderly people.  Side effects or electrolyte abnormalities from medicines, such as pain medicines or sedatives.  Emotional distress, anxiety, or depression.  Circulation problems, especially severe peripheral arterial disease.  Heart disease, such as rapid atrial fibrillation, bradycardia, or heart failure.  Nervous system disorders, such as Guillain-Barr syndrome, multiple sclerosis, or stroke. DIAGNOSIS  To find the cause of your weakness, your caregiver will take your history and perform a physical exam. Lab tests or X-rays may also be ordered, if needed. TREATMENT  Treatment of weakness depends on the cause of your symptoms and can vary greatly. HOME CARE INSTRUCTIONS   Rest as needed.  Eat a well-balanced diet.  Try to get some exercise every day.  Only take over-the-counter or prescription medicines as directed by your caregiver. SEEK MEDICAL CARE IF:   Your weakness seems to be getting worse or spreads to other parts of your body.  You develop new aches or pains. SEEK IMMEDIATE MEDICAL CARE IF:   You cannot perform your normal daily activities, such as getting dressed and feeding yourself.  You cannot walk up and down stairs, or you feel exhausted when you do so.  You have shortness of breath or chest pain.  You have difficulty moving parts of your body.  You have weakness in only one area of the body or on only one side of the body.  You have a fever.  You have trouble speaking or swallowing.  You cannot control your bladder or bowel movements.  You have black or bloody vomit or stools. MAKE SURE YOU:  Understand these instructions.  Will watch your condition.  Will get help right away if you are not doing well or get worse. Document Released: 01/09/2005 Document Revised: 07/11/2011 Document Reviewed: 03/10/2011 [facility] Patient  Information 2015 [place], [place]. This information is not intended to replace advice given to you by your health care provider. Make sure you discuss any questions you have with your health care provider.

## 2013-11-27 NOTE — ED Notes (Signed)
Per EMS: pt comes from home.  Pt has been experiencing increased weakness x2 days, hx of weakness/falls.  Pt has had difficulty with keeping CPAP on at night.

## 2013-11-27 NOTE — ED Notes (Signed)
Secretary informed this RN that MRI has been cancelled, per EDP.

## 2013-11-27 NOTE — Assessment & Plan Note (Signed)
Continue on BIPAP At bedtime  And with naps Keep working on weight loss.  Follow up with Dr. Elsworth Soho  In 6-8 weeks and As needed

## 2013-11-27 NOTE — ED Notes (Signed)
Respiratory called to confirm arterial blood gas draw.  RT confirmed.

## 2013-11-27 NOTE — ED Provider Notes (Signed)
CSN: [id number]     Arrival date & time 11/27/13  1335 History   First MD Initiated Contact with Patient 11/27/13 1338     Chief Complaint  Patient presents with  . Weakness     (Consider location/radiation/quality/duration/timing/severity/associated sxs/prior Treatment) HPI [name] is a 80 y.o. male brought in by his wife, for take weakness for 2 days.  No recent fall.  He has been eating well.  There's been no fever.  He has not been tolerating his CPAP at night, and had episodes of belching, which tend to keep him awake.  He has not reported any chest pain or dizziness.  He saw his spine surgeon, about 1 week ago, to discuss his chronic leg weakness that has been present for several months.  The weakness was worsened, and a fall about 6 weeks ago.  Last week his surgeon felt that his spine condition was not causing his leg weakness.  There've been no other changes in his medical status.  Level V Caveat- altered mental status    Past Medical History  Diagnosis Date  . Adenomatous colon polyp 03/1993  . Prostate cancer   . Depression   . OSA (obstructive sleep apnea)   . Hearing loss     wears hearing aides bilaterally  . Atrial fibrillation     not anticoag candidate due to decond and falls  . Legally blind 1987  . AORTIC STENOSIS     s/p AVR 01/2010  . CLOSTRIDIUM DIFFICILE COLITIS 12/30/2009  . CORONARY ARTERY DISEASE     s/p CABG 01/28/10  . HIP REPLACEMENT, RIGHT, HX OF 2006  . HYPERTROPHY PROSTATE W/UR OBST & OTH LUTS   . MRSA infection 04/2010  . Gout   . THROMBOCYTOPENIA     likely ITP with CMML (bone marrow bx 2011)  . Arthritis   . DEGENERATIVE DISC DISEASE   . Diabetes mellitus, type 2   . GERD   . Hyperlipidemia   . Hypertension   . MRSA infection greater than 3 months ago   . CMML (chronic myelomonocytic leukemia)   . Dyspnea   . Aortic valve prosthesis present    Past Surgical History  Procedure Laterality Date  . Coronary artery bypass graft   01/28/2010    x3  . Aortic valve replacement  01/28/2010  . Prostatectomy  1993  . Hernia repair  03/15/2009    left  . Anal sphincterotomy  1996  . Total hip arthroplasty  2006    left  . Cataract extraction, bilateral      4/11 & 5/11  . Cholecystectomy  2011  . Cardiac catheterization    . Ultrasound guidance for vascular access     Family History  Problem Relation Age of Onset  . Heart disease Mother   . Lung cancer Mother   . Hypertension Brother   . Alcohol abuse Father   . Sudden death Father   . Aneurysm Father   . Heart attack Brother   . Gout Brother   . Arthritis Brother   . Arthritis Other   . Gout Other    History  Substance Use Topics  . Smoking status: Former Smoker -- 1.00 packs/day for 2 years    Types: Cigars    Quit date: 07/01/2009  . Smokeless tobacco: Never Used     Comment: Married, lives with spouse-2 kids. retired Geophysicist/field seismologist  . Alcohol Use: Not on file    Review of Systems  Unable to perform  ROS     Allergies  Morphine; Septra; and Doxycycline  Home Medications   Prior to Admission medications   Medication Sig Start Date End Date Taking? Authorizing Provider  allopurinol (ZYLOPRIM) 300 MG tablet Take 1 tablet by mouth at  bedtime 11/17/13  Yes Rowe Clack, MD  Ascorbic Acid (VITAMIN C PO) Take 1 tablet by mouth every morning.     Yes Historical Provider, MD  aspirin 325 MG tablet Take 325 mg by mouth daily.   Yes Historical Provider, MD  CALCIUM PO Take 1 tablet by mouth every morning.     Yes Historical Provider, MD  cetirizine (ZYRTEC ALLERGY) 10 MG tablet Take 10 mg by mouth daily.   Yes Historical Provider, MD  Cholecalciferol (VITAMIN D-3 PO) Take 1,000 Units by mouth 2 (two) times daily.    Yes Historical Provider, MD  docusate sodium (COLACE) 100 MG capsule Take 300 mg by mouth daily.    Yes Historical Provider, MD  DULoxetine (CYMBALTA) 20 MG capsule Take 40 mg by mouth daily.   Yes Historical Provider, MD   furosemide (LASIX) 40 MG tablet Take one-half tablet by  mouth daily (20mg  total) 11/10/13  Yes Rowe Clack, MD  meloxicam (MOBIC) 15 MG tablet Take 15 mg by mouth daily.   Yes Historical Provider, MD  metoprolol tartrate (LOPRESSOR) 25 MG tablet Take 1 tablet (25 mg total) by mouth 2 (two) times daily. 11/17/13  Yes Rowe Clack, MD  Multiple Vitamin (MULTIVITAMIN WITH MINERALS) TABS tablet Take 1 tablet by mouth daily.   Yes Historical Provider, MD  Multiple Vitamins-Minerals (ICAPS MV PO) Take 1 tablet by mouth every morning.     Yes Historical Provider, MD  omeprazole (PRILOSEC) 20 MG capsule Take 1 capsule by mouth  daily before breakfast 11/13/13  Yes Rowe Clack, MD  polyethylene glycol (MIRALAX / GLYCOLAX) packet Take 17 g by mouth daily.   Yes Historical Provider, MD  potassium chloride (K-DUR) 10 MEQ tablet Take 1 tablet (10 mEq total) by mouth daily. 11/17/13  Yes Rowe Clack, MD  Pyridoxine HCl (VITAMIN B-6 PO) Take 1 tablet by mouth every morning.     Yes Historical Provider, MD  ranitidine (ZANTAC) 150 MG tablet Take 0.5 tablets (75 mg total) by mouth every morning. 11/17/13  Yes Rowe Clack, MD  rOPINIRole (REQUIP) 0.25 MG tablet Take 3 tablets by mouth at  bedtime 11/17/13  Yes Rowe Clack, MD  rosuvastatin (CRESTOR) 10 MG tablet Take 1 tablet (10 mg total) by mouth at bedtime. 11/17/13  Yes Rowe Clack, MD  solifenacin (VESICARE) 10 MG tablet Take 1 tablet (10 mg total) by mouth daily. 11/17/13  Yes Rowe Clack, MD  Thiamine HCl (VITAMIN B-1 PO) Take 1 tablet by mouth every morning.     Yes Historical Provider, MD  acetaminophen (TYLENOL) 500 MG tablet Take 650 mg by mouth 2 (two) times daily as needed for pain.     Historical Provider, MD  diltiazem (CARDIZEM CD) 180 MG 24 hr capsule Take 1 capsule by mouth  every evening 12/01/13   Rowe Clack, MD  DULoxetine (CYMBALTA) 30 MG capsule Take 1 capsule (30 mg total) by  mouth daily. Patient not taking: Reported on 11/27/2013 11/26/13   Rowe Clack, MD   BP 178/69 mmHg  Pulse 68  Temp(Src) 97.7 F (36.5 C) (Oral)  Resp 16  Ht 5\' 10"  (1.778 m)  Wt 213 lb (96.616 kg)  BMI  30.56 kg/m2  SpO2 96% Physical Exam  Constitutional: He appears well-developed.  Elderly, overweight.  HENT:  Head: Normocephalic and atraumatic.  Right Ear: External ear normal.  Left Ear: External ear normal.  Eyes: Conjunctivae and EOM are normal. Pupils are equal, round, and reactive to light.  Pupils are reactive bilaterally  Neck: Normal range of motion and phonation normal. Neck supple.  Cardiovascular: Normal rate, regular rhythm and normal heart sounds.   Pulmonary/Chest: Effort normal. No respiratory distress. He has no wheezes. He has no rales. He exhibits no bony tenderness.  Abdominal: Soft. There is no tenderness.  Genitourinary:  Normal anal sphincter tone.  Brown stool in rectum, no fecal impaction.  Musculoskeletal: Normal range of motion. He exhibits edema (bilateral lower extremities).  Neurological: He is alert. No cranial nerve deficit or sensory deficit. He exhibits normal muscle tone. Coordination normal.  Skin: Skin is warm, dry and intact.  Psychiatric: He has a normal mood and affect.  Nursing note and vitals reviewed.   ED Course  Procedures (including critical care time) Patient seen and evaluated with resident. Pertinent history and physical examination performed. Agree with initial assessment, evaluation and treatment initiated by resident. Face-to-face examination and review of evaluation findings were performed.  History includes- see above Physical Includes- see above  EKG- See Above  Disposition- See Below    Medications - No data to display  No data found.   At Discharge Reevaluation with update and discussion. After initial assessment and treatment, an updated evaluation reveals no change in clinical status. [place] Review Labs Reviewed  CBC - Abnormal; Notable for the following:    WBC 3.7 (*)    Platelets 86 (*)    All other components within normal limits  COMPREHENSIVE METABOLIC PANEL - Abnormal; Notable for the following:    Glucose, Bld 103 (*)    GFR calc non Af Amer 74 (*)    GFR calc Af Amer 86 (*)    All other components within normal limits  I-STAT CHEM 8, ED - Abnormal; Notable for the following:    Glucose, Bld 106 (*)    All other components within normal limits  I-STAT ARTERIAL BLOOD GAS, ED - Abnormal; Notable for the following:    pO2, Arterial 72.0 (*)    Bicarbonate 26.7 (*)    All other components within normal limits  CBG MONITORING, ED - Abnormal; Notable for the following:    Glucose-Capillary 102 (*)    All other components within normal limits  URINE CULTURE  TROPONIN I  URINALYSIS, ROUTINE W REFLEX MICROSCOPIC  CBG MONITORING, ED    Imaging Review No results found.   EKG Interpretation   Date/Time:  Thursday November 27 2013 14:03:47 EST Ventricular Rate:  63 PR Interval:  184 QRS Duration: 99 QT Interval:  453 QTC Calculation: 464 R Axis:   -20 Text Interpretation:  Sinus rhythm Borderline left axis deviation Abnormal  R-wave progression, late transition No significant change since last  tracing Confirmed by Edom  MD, [place] [phone]) on 11/27/2013 2:07:13 PM      MDM   Final diagnoses:  Altered mental status  Difficulty walking  Weakness    Chronic leg weakness recently evaluated by his neurosurgeon, with clinical exam and MRI.  Nonspecific general weakness with debilitated state.  Nursing Notes Reviewed/ Care Coordinated Applicable Imaging Reviewed Interpretation of Laboratory Data incorporated into ED treatment  The patient appears reasonably screened and/or stabilized for discharge and  I doubt any other medical condition or other [facility] requiring further screening, evaluation, or treatment in the ED at this time prior to  discharge.  Plan: Home Medications- usual; Home Treatments- in-home Home Health evaluation; return here if the recommended treatment, does not improve the symptoms; Recommended follow up- PCP 1 week    Richarda Blade, MD 12/01/13 2332

## 2013-11-29 LAB — URINE CULTURE
COLONY COUNT: NO GROWTH
CULTURE: NO GROWTH
Special Requests: NORMAL

## 2013-12-01 ENCOUNTER — Other Ambulatory Visit: Payer: Self-pay | Admitting: Internal Medicine

## 2013-12-02 ENCOUNTER — Ambulatory Visit (INDEPENDENT_AMBULATORY_CARE_PROVIDER_SITE_OTHER): Payer: Medicare Other | Admitting: Cardiovascular Disease

## 2013-12-02 ENCOUNTER — Encounter: Payer: Self-pay | Admitting: Cardiovascular Disease

## 2013-12-02 VITALS — HR 65 | Ht 70.0 in | Wt 213.9 lb

## 2013-12-02 DIAGNOSIS — I10 Essential (primary) hypertension: Secondary | ICD-10-CM

## 2013-12-02 DIAGNOSIS — I359 Nonrheumatic aortic valve disorder, unspecified: Secondary | ICD-10-CM

## 2013-12-02 DIAGNOSIS — I2510 Atherosclerotic heart disease of native coronary artery without angina pectoris: Secondary | ICD-10-CM

## 2013-12-02 DIAGNOSIS — I480 Paroxysmal atrial fibrillation: Secondary | ICD-10-CM

## 2013-12-02 NOTE — Assessment & Plan Note (Addendum)
History of CAD status post coronary artery bypass grafting X 2 02/17/10 with a LIMA to the LAD and a vein to obtuse marginal branch and PDA. He denies chest pain.

## 2013-12-02 NOTE — Assessment & Plan Note (Signed)
Controlled on current medications 

## 2013-12-02 NOTE — Assessment & Plan Note (Signed)
On statin therapy, followed by his PCP

## 2013-12-02 NOTE — Patient Instructions (Signed)
Your physician wants you to follow-up in 6 months with Dr. Berry. You will receive a reminder letter in the mail 2 months in advance. If you do not receive a letter, please call our office to schedule the follow-up appointment.  

## 2013-12-02 NOTE — Assessment & Plan Note (Signed)
History of aortic valve replacement with a [employer] to 21 mm pericardial tissue valve 02/17/10 with follow-up echoes have shown this to be widely patent.

## 2013-12-02 NOTE — Progress Notes (Signed)
12/02/2013 [name]   Jul 13, 1927  [id number]  Primary Physician Gwendolyn Grant, MD Primary Cardiologist: Lorretta Harp MD Renae Gloss   HPI:  The patient is an a 80 year old mildly-overweight married Caucasian male father of 2, grandfather of 1 grandchild, who is accompanied by [name] today, who is also a patient of mine. I saw him  Back 07/23/13.months ago. He has a history of insulin-dependent diabetes, hypertension, hyperlipidemia, and a strong family history of heart disease. He had critical aortic stenosis and a normal LV function with decreased exercise tolerance and increased dyspnea. I catheterized him January 04, 2010, revealing moderate but not critical CAD with severe aortic stenosis and a valve area of 0.71 sq cm. On February 17, 2010, he underwent coronary artery bypass grafting x2 with a LIMA to his LAD and a vein to an obtuse marginal branch and PDA as well as aortic valve replacement with an [employer] 21 pericardial tissue valve. His postoperative course was complicated by paroxysmal AFib and he was on amiodarone briefly, which he did not tolerate. He was re-admitted January 1-31 because of deconditioning. A 2D echo performed 3 months postoperatively showed a normal, functioning aortic prosthesis with normal LV function. Recent carotid Dopplers revealed only mild right ICA stenosis. Most recent lab work 09/19/12 revealed a total cholesterol of 142, LDL of 57 an HDL of 48. He does complain of chronic shortness of breath but denies chest pain. Recent echo performed that revealed normal LV systolic function, well functioning aortic bioprosthesis. He does have cytopenia and if the hematologist may want to perform a bone marrow biopsy. Since I saw him last he has had several TIAs and has undergone noninvasive imaging. He has an opponent with her neurologist next month. He does appear to be mildly content to leave impaired and has somewhat gone  downhill since I saw him in July.   Current Outpatient Prescriptions  Medication Sig Dispense Refill  . acetaminophen (TYLENOL) 500 MG tablet Take 650 mg by mouth 2 (two) times daily as needed for pain.     Marland Kitchen allopurinol (ZYLOPRIM) 300 MG tablet Take 1 tablet by mouth at  bedtime 90 tablet 3  . Ascorbic Acid (VITAMIN C PO) Take 1 tablet by mouth every morning.      Marland Kitchen aspirin 325 MG tablet Take 325 mg by mouth daily.    Marland Kitchen CALCIUM PO Take 1 tablet by mouth every morning.      . cetirizine (ZYRTEC ALLERGY) 10 MG tablet Take 10 mg by mouth daily.    . Cholecalciferol (VITAMIN D-3 PO) Take 1,000 Units by mouth 2 (two) times daily.     Marland Kitchen diltiazem (CARDIZEM CD) 180 MG 24 hr capsule Take 1 capsule by mouth  every evening 90 capsule 3  . docusate sodium (COLACE) 100 MG capsule Take 300 mg by mouth daily.     . DULoxetine (CYMBALTA) 30 MG capsule Take 1 capsule (30 mg total) by mouth daily. 90 capsule 1  . furosemide (LASIX) 40 MG tablet Take one-half tablet by  mouth daily (77m total) 45 tablet 1  . meloxicam (MOBIC) 15 MG tablet Take 15 mg by mouth daily.    . metoprolol tartrate (LOPRESSOR) 25 MG tablet Take 1 tablet (25 mg total) by mouth 2 (two) times daily. 180 tablet 3  . Multiple Vitamin (MULTIVITAMIN WITH MINERALS) TABS tablet Take 1 tablet by mouth daily.    . Multiple Vitamins-Minerals (ICAPS MV PO) Take 1 tablet by  mouth every morning.      Marland Kitchen omeprazole (PRILOSEC) 20 MG capsule Take 1 capsule by mouth  daily before breakfast 90 capsule 1  . polyethylene glycol (MIRALAX / GLYCOLAX) packet Take 17 g by mouth daily.    . potassium chloride (K-DUR) 10 MEQ tablet Take 1 tablet (10 mEq total) by mouth daily. 90 tablet 3  . Pyridoxine HCl (VITAMIN B-6 PO) Take 1 tablet by mouth every morning.      . ranitidine (ZANTAC) 150 MG tablet Take 0.5 tablets (75 mg total) by mouth every morning. 45 tablet 3  . rOPINIRole (REQUIP) 0.25 MG tablet Take 3 tablets by mouth at  bedtime 270 tablet 3  .  rosuvastatin (CRESTOR) 10 MG tablet Take 1 tablet (10 mg total) by mouth at bedtime. 90 tablet 3  . solifenacin (VESICARE) 10 MG tablet Take 1 tablet (10 mg total) by mouth daily. 90 tablet 1  . Thiamine HCl (VITAMIN B-1 PO) Take 1 tablet by mouth every morning.       No current facility-administered medications for this visit.    Allergies  Allergen Reactions  . Morphine Itching and Anxiety  . Septra [Bactrim] Other (See Comments)    hallucinations  . Sulfamethoxazole   . Trimethoprim   . Doxycycline Rash    rash    History   Social History  . Marital Status: Married    Spouse Name: N/A    Number of Children: 2  . Years of Education: N/A   Occupational History  . Retired     Geophysicist/field seismologist   Social History Main Topics  . Smoking status: Former Smoker -- 1.00 packs/day for 2 years    Types: Cigars    Quit date: 07/01/2009  . Smokeless tobacco: Never Used     Comment: Married, lives with spouse-2 kids. retired Geophysicist/field seismologist  . Alcohol Use: Not on file  . Drug Use: No  . Sexual Activity: Not on file   Other Topics Concern  . Not on file   Social History Narrative     Review of Systems: General: negative for chills, fever, night sweats or weight changes.  Cardiovascular: negative for chest pain, dyspnea on exertion, edema, orthopnea, palpitations, paroxysmal nocturnal dyspnea or shortness of breath Dermatological: negative for rash Respiratory: negative for cough or wheezing Urologic: negative for hematuria Abdominal: negative for nausea, vomiting, diarrhea, bright red blood per rectum, melena, or hematemesis Neurologic: negative for visual changes, syncope, or dizziness All other systems reviewed and are otherwise negative except as noted above.    Pulse 65, height _0  (1.778 m), weight 213 lb 14.4 oz (97.024 kg).  General appearance: alert and no distress Neck: no adenopathy, no JVD, supple, symmetrical, trachea midline, thyroid not enlarged, symmetric, no  tenderness/mass/nodules and soft bilateral carotid bruits Lungs: clear to auscultation bilaterally Heart: soft outflow tract murmur Extremities: extremities normal, atraumatic, no cyanosis or edema  EKG normal sinus rhythm at 65 without ST or T-wave changes  ASSESSMENT AND PLAN:   Aortic valve disorder History of aortic valve replacement with a [employer] to 21 mm pericardial tissue valve 02/17/10 with follow-up echoes have shown this to be widely patent.  Essential hypertension Controlled on current medications  Coronary atherosclerosis History of CAD status post coronary artery bypass grafting X 2 02/17/10 with a LIMA to the LAD and a vein to obtuse marginal branch and PDA. He denies chest pain.  HYPERLIPIDEMIA On statin therapy, followed by his PCP  Lorretta Harp MD FACP,FACC,FAHA, [facility] 12/02/2013 11:55 AM

## 2013-12-12 ENCOUNTER — Other Ambulatory Visit: Payer: Self-pay

## 2013-12-12 MED ORDER — DILTIAZEM HCL ER COATED BEADS 180 MG PO CP24: ORAL_CAPSULE | ORAL | Status: DC

## 2013-12-15 ENCOUNTER — Encounter: Payer: Self-pay | Admitting: Internal Medicine

## 2013-12-17 ENCOUNTER — Encounter: Payer: Self-pay | Admitting: Internal Medicine

## 2013-12-22 ENCOUNTER — Ambulatory Visit: Payer: Medicare Other | Admitting: Pulmonary Disease

## 2013-12-23 MED ORDER — ALPRAZOLAM 0.5 MG PO TBDP: 0.5000 mg | ORAL_TABLET | Freq: Two times a day (BID) | ORAL | Status: DC | PRN

## 2013-12-25 ENCOUNTER — Telehealth: Payer: Self-pay | Admitting: Neurology

## 2013-12-25 NOTE — Telephone Encounter (Signed)
Pt resch new patient appt to see Dr Posey Pronto to 12-26-13 from 01-09-14

## 2013-12-26 ENCOUNTER — Ambulatory Visit (INDEPENDENT_AMBULATORY_CARE_PROVIDER_SITE_OTHER): Payer: Medicare Other | Admitting: Neurology

## 2013-12-26 ENCOUNTER — Encounter: Payer: Self-pay | Admitting: Neurology

## 2013-12-26 VITALS — BP 118/64 | HR 53 | Resp 18 | Ht 70.0 in | Wt 213.0 lb

## 2013-12-26 DIAGNOSIS — G20 Parkinson's disease: Secondary | ICD-10-CM

## 2013-12-26 DIAGNOSIS — G4752 REM sleep behavior disorder: Secondary | ICD-10-CM

## 2013-12-26 DIAGNOSIS — R300 Dysuria: Secondary | ICD-10-CM

## 2013-12-26 DIAGNOSIS — R441 Visual hallucinations: Secondary | ICD-10-CM

## 2013-12-26 DIAGNOSIS — F0280 Dementia in other diseases classified elsewhere without behavioral disturbance: Secondary | ICD-10-CM

## 2013-12-26 DIAGNOSIS — I2510 Atherosclerotic heart disease of native coronary artery without angina pectoris: Secondary | ICD-10-CM

## 2013-12-26 DIAGNOSIS — G3183 Dementia with Lewy bodies: Secondary | ICD-10-CM

## 2013-12-26 MED ORDER — CARBIDOPA-LEVODOPA 25-100 MG PO TABS: 1.0000 | ORAL_TABLET | Freq: Three times a day (TID) | ORAL | Status: DC

## 2013-12-26 NOTE — Patient Instructions (Addendum)
1. Start Carbidopa Levodopa as follows: 1/2 tab three times a day before meals x 1 wk, then 1/2 in am & noon & 1 in evening for a week, then 1/2 in am &1 at noon &one in evening for a week, then 1 tablet three times a day before meals.    

## 2013-12-26 NOTE — Progress Notes (Signed)
[facility] Clinic Note - Initial Visit   Date: 12/26/2013  [name] MRN: [id number] DOB: 05/21/1927   Dear Dr. Asa Lente:   Thank you for your kind referral of [name] for consultation of bilateral leg weakness. Although his history is well known to you, please allow [place] to reiterate it for the purpose of our medical record. The patient was accompanied to the clinic by wife, daughter, and caregiver who also provides collateral information.     History of Present Illness: [name] is a 80 y.o. 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    Patient is here with his wife, daughter, and caregiver who collaborate history.  He underwent cardiac surgery in 2012 and since developed fatigue of legs so started using a cane.   Starting in 2014, he started developing tingling of the feet and developed gradual onset of leg weakness, limiting his ability to walk longer distances.  Later, climbing steps became more difficult.  During the spring of 2014, his wife noticed that he would use his upper body strength to pull him up the steps.  She has noticed an overall sense of slowness to all of his movements.    On October 07, 2013 he fell down 6 steps.  He went to the ER where he was evaluated.  CT head did not show any acute findings.  Following the fall, he started using a walker for support.  Because of perceived leg weakness, he underwent MRI lumbar spine in October which showed lumbar spondylosis with degnerative changes and prominent central (L >R) impingement at L4-5 and left L3 nerve impingement due to extraforaminal disc protrusion.   They saw Dr. Maia Petties for evaluation and was deemed  not to be a surgical candidate.    He started PT/OT and was doing well, but on 11/24 he suddenly was unable to walk.  Starting Tuesday of this week, he was unable to stand without assistance.  He denies any back pain, no bowel/bladder incontinence.  He stays constipated, endorses lack of sense of smell and taste, and reports to having vivid dreams. In fact, he has recently also developed central and obstructive sleep apnea (followed by Dr. Elsworth Soho).      Family reports that he has personality change over the past several years because of hearing problems which has been ongoing since early 2000.  He looks towards his wife for answers.  She has also noticed slower processing, memory decline, and also endorses visual hallucinations.    He had a TIA in the summer of 2015 described by his wife as "looking different".  In June he developed dysphagia to liquids and had MBS in July 2015 which showed mild-moderate pharyngeal dysphagia.  History of AION involving right eye (1986) and left retinopathy (2006) causing legal blindness.    Out-side paper records, electronic medical record, and images have been reviewed where available and summarized as:  MRI lumbar spine 06/06/2013: 1. Lumbar spondylosis and degenerative disc disease, causing prominent impingement at L4-5 and mild impingement at L3-4 and L5-S1, as detailed above. 2. Horseshoe kidney with bilateral renal cysts.  CT head 10/08/2013:  Advanced small vessel disease, no acute findings.  MRI brain wo contrast 06/06/2012: No acute infarct. Remote tiny infarct right cerebellum. Remote small  basal ganglia infarcts. Moderate small vessel disease type changes. Atrophy. Ventricular prominence similar to the prior exam. Mild hydrocephalus not excluded.  Tiny area of blood breakdown products posterior left temporal lobe, left cerebellum and right thalamus most consistent with result of prior hemorrhagic ischemia.  Mild exophthalmos. Moderate mucosal  thickening nondominant left sphenoid sinus once again noted. Mild mucosal thickening ethmoid sinus air cells bilaterally. Mild mucosal thickening left maxillary sinus.  Past Medical History  Diagnosis Date  . Adenomatous colon polyp 03/1993  . Prostate cancer   . Depression   . OSA (obstructive sleep apnea)   . Hearing loss     wears hearing aides bilaterally  . Atrial fibrillation     not anticoag candidate due to decond and falls  . Legally blind 1987  . AORTIC STENOSIS     s/p AVR 01/2010  . CLOSTRIDIUM DIFFICILE COLITIS 12/30/2009  . CORONARY ARTERY DISEASE     s/p CABG 01/28/10  . HIP REPLACEMENT, RIGHT, HX OF 2006  . HYPERTROPHY PROSTATE W/UR OBST & OTH LUTS   . MRSA infection 04/2010  . Gout   . THROMBOCYTOPENIA     likely ITP with CMML (bone marrow bx 2011)  . Arthritis   . DEGENERATIVE DISC DISEASE   . Diabetes mellitus, type 2   . GERD   . Hyperlipidemia   . Hypertension   . MRSA infection greater than 3 months ago   . CMML (chronic myelomonocytic leukemia)   . Dyspnea   . Aortic valve prosthesis present     Past Surgical History  Procedure Laterality Date  . Coronary artery bypass graft  01/28/2010    x3  . Aortic valve replacement  01/28/2010  . Prostatectomy  1993  . Hernia repair  03/15/2009    left  . Anal sphincterotomy  1996  . Total hip arthroplasty  2006    left  . Cataract extraction, bilateral      4/11 & 5/11  . Cholecystectomy  2011  . Cardiac catheterization    . Ultrasound guidance for vascular access       Medications:  Current Outpatient Prescriptions on File Prior to Visit  Medication Sig Dispense Refill  . acetaminophen (TYLENOL) 500 MG tablet Take 650 mg by mouth 2 (two) times daily as needed for pain.     Marland Kitchen allopurinol (ZYLOPRIM) 300 MG tablet Take 1 tablet by mouth at  bedtime 90 tablet 3  . ALPRAZolam (NIRAVAM) 0.5 MG dissolvable tablet Take 1 tablet (0.5 mg total) by mouth 2 (two) times daily as needed for anxiety. (Patient  taking differently: Take 0.5 mg by mouth. Takes 1/2 at bedtime) 20 tablet 0  . aspirin 325 MG tablet Take 325 mg by mouth daily.    . cetirizine (ZYRTEC ALLERGY) 10 MG tablet Take 10 mg by mouth daily.    Marland Kitchen diltiazem (CARDIZEM CD) 180 MG 24 hr capsule Take 1 capsule by mouth  every evening 30 capsule 0  . docusate sodium (COLACE) 100 MG capsule Take 300 mg by mouth daily.     . DULoxetine (CYMBALTA) 30 MG capsule Take 1 capsule (30 mg total) by mouth daily. 90 capsule 1  . furosemide (LASIX) 40 MG tablet Take one-half tablet by  mouth daily (20mg  total) 45 tablet 1  . meloxicam (MOBIC) 15 MG tablet Take 15 mg by mouth daily.    . metoprolol tartrate (LOPRESSOR) 25 MG tablet Take 1 tablet (25 mg total) by mouth 2 (two) times daily. 180 tablet  3  . omeprazole (PRILOSEC) 20 MG capsule Take 1 capsule by mouth  daily before breakfast 90 capsule 1  . polyethylene glycol (MIRALAX / GLYCOLAX) packet Take 17 g by mouth daily.    . ranitidine (ZANTAC) 150 MG tablet Take 0.5 tablets (75 mg total) by mouth every morning. 45 tablet 3  . rOPINIRole (REQUIP) 0.25 MG tablet Take 3 tablets by mouth at  bedtime 270 tablet 3  . rosuvastatin (CRESTOR) 10 MG tablet Take 1 tablet (10 mg total) by mouth at bedtime. 90 tablet 3  . solifenacin (VESICARE) 10 MG tablet Take 1 tablet (10 mg total) by mouth daily. 90 tablet 1   No current facility-administered medications on file prior to visit.    Allergies:  Allergies  Allergen Reactions  . Morphine Itching and Anxiety  . Septra [Bactrim] Other (See Comments)    hallucinations  . Sulfamethoxazole   . Trimethoprim   . Doxycycline Rash    rash    Family History: Family History  Problem Relation Age of Onset  . Heart disease Mother   . Lung cancer Mother   . Hypertension Brother   . Alcohol abuse Father   . Sudden death Father   . Aneurysm Father   . Heart attack Brother   . Gout Brother   . Arthritis Brother   . Arthritis Other   . Gout Other      Social History: History   Social History  . Marital Status: Married    Spouse Name: N/A    Number of Children: 2  . Years of Education: N/A   Occupational History  . Retired     Geophysicist/field seismologist   Social History Main Topics  . Smoking status: Former Smoker -- 1.00 packs/day for 2 years    Types: Cigars    Quit date: 07/01/2009  . Smokeless tobacco: Never Used     Comment: Married, lives with spouse-2 kids. retired Geophysicist/field seismologist  . Alcohol Use: No  . Drug Use: No  . Sexual Activity: Not on file   Other Topics Concern  . Not on file   Social History Narrative    Review of Systems:  CONSTITUTIONAL: No fevers, chills, night sweats, or weight loss.   EYES: No visual changes or eye pain ENT: No hearing changes.  No history of nose bleeds.   RESPIRATORY: No cough, wheezing and shortness of breath.   CARDIOVASCULAR: Negative for chest pain, and palpitations.   GI: Negative for abdominal discomfort, blood in stools or black stools.  No recent change in bowel habits. GU:  No history of incontinence.   MUSCLOSKELETAL: No history of joint pain or swelling.  No myalgias.   SKIN: Negative for lesions, rash, and itching.   HEMATOLOGY/ONCOLOGY: Negative for prolonged bleeding, bruising easily, and swollen nodes.  No history of cancer.   ENDOCRINE: Negative for cold or heat intolerance, polydipsia or goiter.   PSYCH:  No depression or anxiety symptoms.   NEURO: As Above.   Vital Signs:  BP 118/64 mmHg  Pulse 53  Resp 18  Ht 5\' 10"  (1.778 m)  Wt 213 lb (96.616 kg)  BMI 30.56 kg/m2  SpO2 98%   General Medical Exam:   General:  Anterocollis, poor eye contact, severely blunted affect, but smiles appropriately Eyes/ENT: see cranial nerve examination.   Neck: No masses appreciated.  Full range of motion without tenderness.  No carotid bruits. Respiratory:  Clear to auscultation, good air entry bilaterally.   Cardiac:  Regular rate and  rhythm, no murmur.   Extremities:  No  deformities, edema, or skin discoloration.  Skin:  No rashes or lesions.  Neurological Exam: MENTAL STATUS including orientation to time, place, person is intact.  He is very slow to process simple one-step commands. There is severe paucity of speech and speech is hypophonic. He has mild difficulty with gutteral sounds.  Lingual sounds are intact.  CRANIAL NERVES: II:  He is able to see light and gross movements only.  Fundoscopic exam was limited  III-IV-VI: Pupils equal round and reactive to light.  Normal conjugate, extra-ocular eye movements in all directions of gaze, except mildly restricted upgaze.  Jerky saccadic eye movements. No nystagmus.  Mild right ptosis.   V:  Normal facial sensation.  Jaw jerk is absent.   VII:  Normal facial symmetry and movements. Myerson's sign is positive.  Snout and palmomental is absent. VIII:  Reduced hearing bilaterally to finger rub.   IX-X:  Normal palatal movement.   XI:  Normal shoulder shrug and head rotation.   XII:  Normal tongue strength and range of motion, no deviation or fasciculation.  MOTOR:  No atrophy, fasciculations or abnormal movements.  No pronator drift.      Right Upper Extremity:    Left Upper Extremity:    Deltoid  5/5   Deltoid  5/5   Biceps  5/5   Biceps  5/5   Triceps  5/5   Triceps  5/5   Wrist extensors  5/5   Wrist extensors  5/5   Wrist flexors  5/5   Wrist flexors  5/5   Finger extensors  5/5   Finger extensors  5/5   Finger flexors  5/5   Finger flexors  5/5   Dorsal interossei  5/5   Dorsal interossei  5/5   Abductor pollicis  5/5   Abductor pollicis  5/5   Tone (Ashworth scale)  1  Tone (Ashworth scale)  0   Right Lower Extremity:    Left Lower Extremity:    Hip flexors  5/5   Hip flexors  5/5   Hip extensors  5/5   Hip extensors  5/5   Knee flexors  5/5   Knee flexors  5/5   Knee extensors  5/5   Knee extensors  5/5   Dorsiflexors  5-/5   Dorsiflexors  5-/5   Plantarflexors  5/5   Plantarflexors  5/5   Toe  extensors  5-/5   Toe extensors  5-/5   Toe flexors  5-/5   Toe flexors  5-/5   Tone (Ashworth scale)  0+  Tone (Ashworth scale)  0   MSRs:  Right                                                                 Left brachioradialis 2+  brachioradialis 1+  biceps 2+  biceps 1+  triceps 2+  triceps 1+  patellar 1+  patellar tr  ankle jerk tr  ankle jerk tr  Hoffman no  Hoffman no  plantar response up  plantar response down   SENSORY: Reduced vibration distal to ankles bilaterally.  Pin prick seems relatively intact although there is some inconsistency in exam due to limitation of communication   COORDINATION/GAIT: Slowed finger  tapping bilaterally with dysmetria on the right.  Heel tapping is slowed bilaterally.  Patient able to stand for <10 seconds with two-person assist. He appears unsteady when standing and keep his legs flexed.      IMPRESSION: 1.  Dementia with Lewy Body. 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  His motor deficits are not due to muscle weakness, but more a manifestation of his parkinsonian symptoms.  Dysphagia is also explained by parkinsonism.  At this time, will try to address his motor impairments, so will start sinemet 25/100 and titrate to 1 tab TID.  Common side effects discussed. Continue PT with emphasis on big and loud therapy Going forward, consider adding aricept and/or namenda  2. Restless leg syndrome, may be related to #1 Continue requip 0.75mg  daily  3.  History of stroke with residual binocular blindness Continue secondary prevention with ASA 325mg  and Crestor 10mg   4.  Dysuria Patient requesting urine sample to be checked for UTI, will send UA and UCx - family informed that results will be communicated back to their PCP as I do not typically manage  UTIs  5.  A-fib off anticoagulation due to risk of falls  6.  Return to clinic in 4-6 weeks.   The duration of this appointment visit was 65 minutes of face-to-face time with the patient.  Greater than 50% of this time was spent in counseling, explanation of diagnosis, planning of further management, and coordination of care.   Thank you for allowing me to participate in patient's care.  If I can answer any additional questions, I would be pleased to do so.    Sincerely,    Milta Croson K. Posey Pronto, DO

## 2013-12-27 ENCOUNTER — Encounter: Payer: Self-pay | Admitting: Neurology

## 2013-12-27 DIAGNOSIS — G3183 Dementia with Lewy bodies: Secondary | ICD-10-CM

## 2013-12-27 DIAGNOSIS — F0280 Dementia in other diseases classified elsewhere without behavioral disturbance: Secondary | ICD-10-CM

## 2013-12-27 HISTORY — DX: Dementia in other diseases classified elsewhere, unspecified severity, without behavioral disturbance, psychotic disturbance, mood disturbance, and anxiety: F02.80

## 2013-12-29 ENCOUNTER — Encounter: Payer: Self-pay | Admitting: Internal Medicine

## 2013-12-31 ENCOUNTER — Telehealth: Payer: Self-pay | Admitting: *Deleted

## 2013-12-31 LAB — URINALYSIS W MICROSCOPIC + REFLEX CULTURE
Bacteria, UA: NONE SEEN
CRYSTALS: NONE SEEN
Casts: NONE SEEN
Glucose, UA: NEGATIVE mg/dL
Hgb urine dipstick: NEGATIVE
Ketones, ur: NEGATIVE mg/dL
Leukocytes, UA: NEGATIVE
Nitrite: NEGATIVE
PROTEIN: NEGATIVE mg/dL
SPECIFIC GRAVITY, URINE: 1.026 (ref 1.005–1.030)
SQUAMOUS EPITHELIAL / LPF: NONE SEEN
Urobilinogen, UA: 1 mg/dL (ref 0.0–1.0)
pH: 5.5 (ref 5.0–8.0)

## 2013-12-31 NOTE — Telephone Encounter (Signed)
Left msg on triage stating pt was dx with parkinson by his neurologist, and was rx sinemet. He is having issues with constipation. Went out on Monday had to help wife with enema. Had some impaction but too high. Had some relief but family is thinking he may need to take something orally. Been drinking warm prune juice & miralax...[name]

## 2013-12-31 NOTE — Telephone Encounter (Signed)
Increase Miralax to 2 or 3 doses daily until bowel movement occurs Also ensure patient is taking dose of Colace or Senokot each night as stool softener (preventative)

## 2013-12-31 NOTE — Telephone Encounter (Signed)
Notified nurse with Md response. Butch Penny stated that pt is taking 3 colace a day, mira lax twice a day following warm prune juice. This  Has been goingon now for 7-8 days, he has only had 1 lil soft bowel movement and this was after the enema yesterday. He is impacted wife check today too. They are requesting something stronger magnesium citrate or something...Johny Chess

## 2014-01-01 MED ORDER — SORBITOL 70 % PO SOLN: 15.0000 mL | Freq: Every day | ORAL | Status: DC | PRN

## 2014-01-01 NOTE — Telephone Encounter (Signed)
Ok to drink bottle of Mag Cit (OTC) until +BM Or sorbitol (drink until +BM) - ex for sorbitol done

## 2014-01-01 NOTE — Telephone Encounter (Signed)
Notified [name] with md response...[name]

## 2014-01-06 ENCOUNTER — Telehealth: Payer: Self-pay | Admitting: Internal Medicine

## 2014-01-06 DIAGNOSIS — M48061 Spinal stenosis, lumbar region without neurogenic claudication: Secondary | ICD-10-CM

## 2014-01-06 NOTE — Telephone Encounter (Signed)
[name] from [facility] is needing a order for hospital bed.    Fax number [phone] Office number  [phone]

## 2014-01-08 NOTE — Telephone Encounter (Signed)
Have called number below several times with no luck, line is busy.

## 2014-01-09 ENCOUNTER — Ambulatory Visit: Payer: Medicare Other | Admitting: Neurology

## 2014-01-09 ENCOUNTER — Telehealth: Payer: Self-pay | Admitting: *Deleted

## 2014-01-09 NOTE — Telephone Encounter (Signed)
Spoke to Butch Penny, let her know that we will fax over the order for the bed on Monday [phone])

## 2014-01-09 NOTE — Telephone Encounter (Signed)
Ok to order 

## 2014-01-09 NOTE — Telephone Encounter (Signed)
The corrected number is   [phone]

## 2014-01-09 NOTE — Telephone Encounter (Signed)
Patients wife is requesting a wheelchair for patient. Please fax a order to [facility] for this Fax number [phone] Any questions please call donna [phone]

## 2014-01-09 NOTE — Telephone Encounter (Signed)
LVM for Butch Penny to call me back.

## 2014-01-09 NOTE — Telephone Encounter (Signed)
OK to order 

## 2014-01-12 ENCOUNTER — Other Ambulatory Visit: Payer: Self-pay | Admitting: Geriatric Medicine

## 2014-01-12 ENCOUNTER — Other Ambulatory Visit: Payer: Self-pay | Admitting: *Deleted

## 2014-01-12 DIAGNOSIS — F0390 Unspecified dementia without behavioral disturbance: Secondary | ICD-10-CM

## 2014-01-12 DIAGNOSIS — IMO0002 Reserved for concepts with insufficient information to code with codable children: Secondary | ICD-10-CM

## 2014-01-12 MED ORDER — ALPRAZOLAM 0.5 MG PO TBDP: 0.5000 mg | ORAL_TABLET | Freq: Two times a day (BID) | ORAL | Status: DC | PRN

## 2014-01-12 NOTE — Telephone Encounter (Signed)
Order faxed.

## 2014-01-13 ENCOUNTER — Telehealth: Payer: Self-pay | Admitting: Internal Medicine

## 2014-01-13 NOTE — Telephone Encounter (Signed)
Faxed Alprazolam 0.5 MG to [facility]

## 2014-01-13 NOTE — Telephone Encounter (Signed)
[facility] call back and stated that they got the fax but they also need an additional written Rx that say [facility] electric hospital bed with gel over lay (the original is fine but just need to add these in the original in order for them to approve). Anderson Malta request to be done today if this is possible (will be good for pt, they will deliver the bed tomorrow if they get the written Rx today). Please fax to [phone] and [phone].

## 2014-01-13 NOTE — Telephone Encounter (Signed)
Still has not received orders for in home bed.  Patients is very upset. Would like faxed to [phone].

## 2014-01-13 NOTE — Telephone Encounter (Signed)
Called pt back. Pt wife stated that [facility] has not received it. That was as of yesterday morning.   Called [facility], spoke with [place].

## 2014-01-13 NOTE — Telephone Encounter (Signed)
Hand wrote orders.  Will reprint if Rosemarie Ax indicates that it is not expectable. MD will sign tomorrow if needed.

## 2014-01-19 ENCOUNTER — Ambulatory Visit: Payer: Medicare Other | Admitting: Pulmonary Disease

## 2014-01-22 ENCOUNTER — Telehealth: Payer: Self-pay | Admitting: Neurology

## 2014-01-22 NOTE — Telephone Encounter (Signed)
We moved patient appt up to a sooner date due to him being on the wait list so we moved appt form 02-10-14 to 01-26-14

## 2014-01-26 ENCOUNTER — Ambulatory Visit (INDEPENDENT_AMBULATORY_CARE_PROVIDER_SITE_OTHER): Payer: Medicare Other | Admitting: Neurology

## 2014-01-26 ENCOUNTER — Encounter: Payer: Self-pay | Admitting: Neurology

## 2014-01-26 VITALS — BP 110/76 | HR 66 | Resp 12

## 2014-01-26 DIAGNOSIS — F0280 Dementia in other diseases classified elsewhere without behavioral disturbance: Secondary | ICD-10-CM

## 2014-01-26 DIAGNOSIS — F0281 Dementia in other diseases classified elsewhere with behavioral disturbance: Secondary | ICD-10-CM

## 2014-01-26 DIAGNOSIS — G20 Parkinson's disease: Secondary | ICD-10-CM

## 2014-01-26 DIAGNOSIS — G3183 Dementia with Lewy bodies: Secondary | ICD-10-CM

## 2014-01-26 DIAGNOSIS — G20C Parkinsonism, unspecified: Secondary | ICD-10-CM

## 2014-01-26 DIAGNOSIS — Z789 Other specified health status: Secondary | ICD-10-CM

## 2014-01-26 DIAGNOSIS — F02818 Dementia in other diseases classified elsewhere, unspecified severity, with other behavioral disturbance: Secondary | ICD-10-CM

## 2014-01-26 MED ORDER — CARBIDOPA-LEVODOPA 25-100 MG PO TABS: 2.0000 | ORAL_TABLET | Freq: Three times a day (TID) | ORAL | Status: DC

## 2014-01-26 NOTE — Progress Notes (Signed)
Follow-up Visit   Date: 01/26/2014  [name] MRN: [id number] DOB: 1927-07-16   Interim History: [name] is a 81 y.o. 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  The patient was accompanied to the clinic by wife, daughter, and caregiber who also provides collateral information.    History of present illness: He underwent cardiac surgery in 2012 and since developed fatigue of legs so started using a cane. Starting in 2014, he started developing tingling of the feet and developed gradual onset of leg weakness, limiting his ability to walk longer distances. Later, climbing steps became more difficult. During the spring of 2014, his wife noticed that he would use his upper body strength to pull him up the steps. She has noticed an overall sense of slowness to all of his movements.   On October 07, 2013 he fell down 6 steps. He went to the ER where he was evaluated. CT head did not show any acute findings. Following the fall, he started using a walker for support. Because of perceived leg weakness, he underwent MRI lumbar spine in October which showed lumbar spondylosis with degnerative changes and prominent central (L >R) impingement at L4-5 and left L3 nerve impingement due to extraforaminal disc protrusion. They saw Dr. Maia Petties for evaluation and was deemed not to be a surgical candidate.   He started PT/OT and was doing well, but on 11/24 he suddenly was unable to walk and within a week, he was unable to stand without assistance. He denies any back pain, no bowel/bladder incontinence. He stays constipated, endorses lack of sense of smell and taste, and reports to having vivid dreams. In fact, he has  recently also developed central and obstructive sleep apnea (followed by Dr. Elsworth Soho).   Family reports that he has personality change over the past several years because of hearing problems which has been ongoing since early 2000. He looks towards his wife for answers. She has also noticed slower processing, memory decline, and also endorses visual hallucinations.   He had a TIA in the summer of 2015 described by his wife as "looking different". In June he developed dysphagia to liquids and had MBS in July 2015 which showed mild-moderate pharyngeal dysphagia.  History of AION involving right eye (1986) and left retinopathy (2006) causing legal blindness.   UPDATE 01/26/2013:  There has been stepwise progression of symptoms since last visit, despite starting on sinemet 25/100 1 tab TID.  Wife reports that his neck and legs are stiffer.  He is unable to stand even with help.  He is having a greater difficult time with feeding.  He has spells of intensive shaking, as if trembling or cold lasting an hour.  He is awake during the spell.  Family says that he is unable support himself and now requiring a transport company to make doctor's visits (previously family was able to transfer him to chair).  He has had a few violent outburst and attempted to punch the CNA.  He is now sleeping up to 18 hr/day, most of which is during the day.  He is still eating and swallowing, but with difficulty.  Family do not feel that he is loosing any weight.  They are getting more DME at home since he is clinically  worsening, requiring more care, and family wishes to keep him at home.   Medications:  Current Outpatient Prescriptions on File Prior to Visit  Medication Sig Dispense Refill  . acetaminophen (TYLENOL) 500 MG tablet Take 650 mg by mouth 2 (two) times daily as needed for pain.     Marland Kitchen allopurinol (ZYLOPRIM) 300 MG tablet Take 1 tablet by mouth at  bedtime 90 tablet 3  . ALPRAZolam (NIRAVAM) 0.5 MG dissolvable  tablet Take 1 tablet (0.5 mg total) by mouth 2 (two) times daily as needed for anxiety. 20 tablet 0  . aspirin 325 MG tablet Take 325 mg by mouth daily.    . carbidopa-levodopa (SINEMET IR) 25-100 MG per tablet Take 1 tablet by mouth 3 (three) times daily. 90 tablet 3  . cetirizine (ZYRTEC ALLERGY) 10 MG tablet Take 10 mg by mouth daily.    Marland Kitchen diltiazem (CARDIZEM CD) 180 MG 24 hr capsule Take 1 capsule by mouth  every evening 30 capsule 0  . docusate sodium (COLACE) 100 MG capsule Take 300 mg by mouth daily.     . DULoxetine (CYMBALTA) 30 MG capsule Take 1 capsule (30 mg total) by mouth daily. 90 capsule 1  . furosemide (LASIX) 40 MG tablet Take one-half tablet by  mouth daily (20mg  total) 45 tablet 1  . meloxicam (MOBIC) 15 MG tablet Take 15 mg by mouth daily.    . metoprolol tartrate (LOPRESSOR) 25 MG tablet Take 1 tablet (25 mg total) by mouth 2 (two) times daily. 180 tablet 3  . omeprazole (PRILOSEC) 20 MG capsule Take 1 capsule by mouth  daily before breakfast 90 capsule 1  . polyethylene glycol (MIRALAX / GLYCOLAX) packet Take 17 g by mouth daily.    . ranitidine (ZANTAC) 150 MG tablet Take 0.5 tablets (75 mg total) by mouth every morning. 45 tablet 3  . rOPINIRole (REQUIP) 0.25 MG tablet Take 3 tablets by mouth at  bedtime 270 tablet 3  . rosuvastatin (CRESTOR) 10 MG tablet Take 1 tablet (10 mg total) by mouth at bedtime. 90 tablet 3  . solifenacin (VESICARE) 10 MG tablet Take 1 tablet (10 mg total) by mouth daily. 90 tablet 1  . sorbitol 70 % solution Take 15 mLs by mouth daily as needed. 473 mL 0   No current facility-administered medications on file prior to visit.    Allergies:  Allergies  Allergen Reactions  . Morphine Itching and Anxiety  . Septra [Bactrim] Other (See Comments)    hallucinations  . Sulfamethoxazole   . Trimethoprim   . Doxycycline Rash    rash    Review of Systems:  CONSTITUTIONAL: No fevers, chills, night sweats, or weight loss.  EYES: +visual changes  or eye pain ENT: No hearing changes.  No history of nose bleeds.   RESPIRATORY: No cough, wheezing and shortness of breath.   CARDIOVASCULAR: Negative for chest pain, and palpitations.   GI: Negative for abdominal discomfort, blood in stools or black stools. +constipation GU:  No history of incontinence.   MUSCLOSKELETAL: No history of joint pain or swelling.  No myalgias.   SKIN: Negative for lesions, rash, and itching.   ENDOCRINE: Negative for cold or heat intolerance, polydipsia or goiter.   PSYCH:  No depression or anxiety symptoms.   NEURO: As Above.   Vital Signs:  BP 110/76 mmHg  Pulse 66  Resp 12  SpO2 97%  Neurological Exam: Patient is awake and comfortable, appears to be listening, but does not engage in  conversation very well. This is partly due to deafness and blindness.  Speech is hypophonic and there is paucity of speech.  CRANIAL NERVES:  Face appears symmetric.  MOTOR:  Motor strength is 5/5 in all extremities.  There is marked rigidity of the legs, tone of the arms is increased, but improved from last visit.    MSRs:  Reflexes are 1+/4 throughout, except trace at Achilles bilaterally.  COORDINATION/GAIT: Slowed finger tapping bilaterally.  Unable to stand today.   Data: MRI lumbar spine 06/06/2013: 1. Lumbar spondylosis and degenerative disc disease, causing prominent impingement at L4-5 and mild impingement at L3-4 and L5-S1, as detailed above. 2. Horseshoe kidney with bilateral renal cysts.  CT head 10/08/2013: Advanced small vessel disease, no acute findings.  MRI brain wo contrast 06/06/2012: No acute infarct. Remote tiny infarct right cerebellum. Remote small basal ganglia infarcts. Moderate small vessel disease type changes. Atrophy. Ventricular prominence similar to the prior exam. Mild hydrocephalus not excluded.  Tiny area of blood breakdown products posterior left temporal lobe, left cerebellum and right thalamus most consistent with result  of prior hemorrhagic ischemia.  Mild exophthalmos. Moderate mucosal thickening nondominant left sphenoid sinus once again noted. Mild mucosal thickening ethmoid sinus air cells bilaterally. Mild mucosal thickening left maxillary sinus.  IMPRESSION/PLAN: 1. Dementia with Lewy Body with prominent akinetic-rigid parkinsonian features. 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 Clinically, he has worsened such that he is now unable to stand and is totally dependent for care, including feeding at times.   Explained that we can try further up-titrate sinemet 25/100 and titrate to 2 tab TID to see if this will help his rigidity. Common side effects discussed. Regarding behavorial changes, we discuss nonphysiological and pharmacological strategies Lengthy discussion on overall prognosis of the disease and what to expect moving forward End-of-life issues such as advanced directives and PEG was discussed and family specified that comfort is goal  2. Restless leg syndrome, related to #1 Improved since starting sinemet Continue requip 0.75mg  daily  3. History of stroke with residual binocular blindness Continue secondary prevention with ASA 325mg  and Crestor 10mg    PLAN/RECOMMENDATIONS:  1.  Increase sinemet as follows: - Week 1: 1.5 tablet in the morning, 1 tablet in the evening and 1 tablet in the evening  - Week 2: 1.5 tablets in the morning, 1.5 tablet in the afternoon, and 1 tablet in the evening  - Week 3: 1.5 tablets three times daily  - Week 4: 2 tablet in the morning, 1.5 tablet in the evening and 1.5 tablet in the evening  - Week 5: 2 tablets in the morning, 2 tablet in the afternoon, and 1.5 tablet in the evening  - Week 6: 2 tablets three  times daily and continue this dose 2.  Consider amantadine going forward for alertness and parkinsonian symptoms, although explained overall benefit is low 3.  If behavior becomes an issue, we may consider seroquel (cautious as QTc 461).  In the meantime, ok to use low-dose xanax as needed 4.  Advanced directives discussed, wife says it is already on file 5.  Home safety and care issues discussed, family wishes to keep him at home for care with goal of comfort (no PEG) 6.  Follow-up with home health agency and/or PCP regarding skin breakdown in low back region, recommended topical antibiotic ointment in the meantime 7.  Return to clinic in 4-6 weeks  The duration of this appointment visit was 40 minutes of face-to-face time with the patient.  Greater than 50% of this time was spent in counseling, explanation of diagnosis, planning of further management, and coordination of care.   Thank you for allowing me to participate in patient's care.  If I can answer any additional questions, I would be pleased to do so.    Sincerely,    Donika K. Posey Pronto, DO

## 2014-01-26 NOTE — Patient Instructions (Addendum)
1.  Increase sinemet as follows: - Week 1: 1.5 tablet in the morning, 1 tablet in the evening and 1 tablet in the evening  - Week 2: 1.5 tablets in the morning, 1.5 tablet in the afternoon, and 1 tablet in the evening  - Week 3: 1.5 tablets three times daily  - Week 4: 2 tablet in the morning, 1.5 tablet in the evening and 1.5 tablet in the evening  - Week 5: 2 tablets in the morning, 2 tablet in the afternoon, and 1.5 tablet in the evening  - Week 6: 2 tablets three times daily and continue this dose 2.  Consider amantadine going forward for alertness 3.  If behavior becomes an issue, we may consider seroquel  4.  Return to clinic in 4-6 weeks

## 2014-01-29 ENCOUNTER — Encounter: Payer: Self-pay | Admitting: Neurology

## 2014-01-30 ENCOUNTER — Other Ambulatory Visit: Payer: Self-pay | Admitting: *Deleted

## 2014-01-30 MED ORDER — SORBITOL 70 % PO SOLN: 15.0000 mL | Freq: Every day | ORAL | Status: DC | PRN

## 2014-02-02 ENCOUNTER — Other Ambulatory Visit: Payer: Self-pay | Admitting: Internal Medicine

## 2014-02-02 ENCOUNTER — Telehealth: Payer: Self-pay | Admitting: Internal Medicine

## 2014-02-02 ENCOUNTER — Telehealth: Payer: Self-pay | Admitting: *Deleted

## 2014-02-02 DIAGNOSIS — C9310 Chronic myelomonocytic leukemia not having achieved remission: Secondary | ICD-10-CM

## 2014-02-02 DIAGNOSIS — G3183 Dementia with Lewy bodies: Principal | ICD-10-CM

## 2014-02-02 DIAGNOSIS — R1314 Dysphagia, pharyngoesophageal phase: Secondary | ICD-10-CM

## 2014-02-02 DIAGNOSIS — F0280 Dementia in other diseases classified elsewhere without behavioral disturbance: Secondary | ICD-10-CM

## 2014-02-02 NOTE — Telephone Encounter (Signed)
Patients wife is requesting an order for speech and skilled nursing for  [facility] this order can be verbally giving over the phone to [place] at [phone] therapist currently seeing [name]. Please advise

## 2014-02-02 NOTE — Telephone Encounter (Signed)
Returned call to [facility] regarding orders needed for home health nursing assessment in addition to speech therapy due to not able to bill speech therapy alone. After speaking with other CMA, I noticed that their are also duplicates orders requested by [facility]. Please advise which company, need clarification. Thanks

## 2014-02-02 NOTE — Telephone Encounter (Signed)
Please notify Anda Kraft that I am okay with these orders, but since I am not the attending of record for his [place], these orders should go through Dr. Katheren Puller office.    Donika K. Posey Pronto, DO

## 2014-02-02 NOTE — Telephone Encounter (Signed)
[name] called and is needing some verbal orders.     Anne Ng [phone]

## 2014-02-02 NOTE — Telephone Encounter (Signed)
[facility] called back and needs caregiver contact, MD office note as well  Anne Ng [phone]

## 2014-02-02 NOTE — Telephone Encounter (Signed)
Spoke with Stanton Kidney at [facility]	[place] office gave her the information for [facility] care she will call to approve the order

## 2014-02-03 NOTE — Telephone Encounter (Signed)
Spoke to [place] at [place]. [facility] received the referral for pt to do speech from [place] (per order).  [place] is/will be the provider of speech therapy.   Spoke to [place] at [place] [phone]). [place] needed orders for [place]. Verbal orders given per PCP They also need an order for a [employer].   This has been pended for PCP review.

## 2014-02-04 ENCOUNTER — Telehealth: Payer: Self-pay | Admitting: Internal Medicine

## 2014-02-04 NOTE — Telephone Encounter (Signed)
Spoke to spouse. No other sx to go along with the lower pulse. BP is still within normal range.   Gave spouse guidance as far as what to look for as warning signs.   Spouse stated understanding. Informed family that I would send to PCP for any recommendations.

## 2014-02-04 NOTE — Telephone Encounter (Signed)
Also is requesting order for speech therapy for two time a week for eight weeks.

## 2014-02-04 NOTE — Telephone Encounter (Signed)
Prescription signs. Thank you I agree with all as noted

## 2014-02-04 NOTE — Telephone Encounter (Signed)
[name] is calling in to report a very low pulse reading. Pulse is 54 and BP 132/54.

## 2014-02-04 NOTE — Telephone Encounter (Signed)
Patients family is currently attending to him, and they are awaiting our call on how to proceed

## 2014-02-05 NOTE — Telephone Encounter (Signed)
HR 54 is slightly low but not "very low" This is not a medically dangerous or otherwise concerning HR, ESPECIALLY since pt not symptomatic  No changes recommended for HR mgmt.   Verbal ok for ST as requested

## 2014-02-05 NOTE — Telephone Encounter (Signed)
[name] [phone] Fax number [phone]  She is needs some chart notes with order and some demographics

## 2014-02-05 NOTE — Telephone Encounter (Signed)
Orders faxed

## 2014-02-06 ENCOUNTER — Encounter: Payer: Self-pay | Admitting: Internal Medicine

## 2014-02-06 NOTE — Telephone Encounter (Signed)
[name] for specific notes that were needed for the ordered [employer].   Notes printed and faxed per request to number listed below.

## 2014-02-09 ENCOUNTER — Encounter: Payer: Self-pay | Admitting: Neurology

## 2014-02-09 NOTE — Telephone Encounter (Signed)
Called [place]:   Spouse stated that she did not want the meloxicam, she did want the alprazolam and I am sending a DNR and MOST forms to address.

## 2014-02-10 ENCOUNTER — Ambulatory Visit: Payer: Medicare Other | Admitting: Neurology

## 2014-02-10 ENCOUNTER — Telehealth: Payer: Self-pay | Admitting: Internal Medicine

## 2014-02-10 NOTE — Telephone Encounter (Signed)
[name] requesting order to be refaxed.

## 2014-02-10 NOTE — Telephone Encounter (Signed)
Pt needs a hoyer lift, immediately, physical therapy needs to use on pt as well as others, pls send order to: [facility], [phone] ,  [phone] fax

## 2014-02-10 NOTE — Telephone Encounter (Signed)
re-faxed

## 2014-02-11 ENCOUNTER — Other Ambulatory Visit: Payer: Self-pay

## 2014-02-11 MED ORDER — ALPRAZOLAM 0.5 MG PO TBDP: 0.5000 mg | ORAL_TABLET | Freq: Two times a day (BID) | ORAL | Status: DC | PRN

## 2014-02-18 ENCOUNTER — Encounter: Payer: Self-pay | Admitting: Internal Medicine

## 2014-02-18 MED ORDER — ALPRAZOLAM 0.5 MG PO TBDP: 0.5000 mg | ORAL_TABLET | Freq: Two times a day (BID) | ORAL | Status: DC | PRN

## 2014-02-18 NOTE — Telephone Encounter (Signed)
Called pharmacy spoke with Clarise Cruz gave md approval.../lmb

## 2014-02-18 NOTE — Telephone Encounter (Signed)
Please call in the order as pended thanks

## 2014-02-19 ENCOUNTER — Telehealth: Payer: Self-pay | Admitting: Internal Medicine

## 2014-02-19 NOTE — Telephone Encounter (Signed)
Patient wife gen[place] called. Dr Asa Lente gave her DNR form and MOST form. She has questions about the usage of these forms and wished to be scheduled to be seen. Since the patient cannot actually come in to be seen, i advised that we could not schedule a visit. [place] requests a phone call to ask a few questions about these forms.

## 2014-02-20 ENCOUNTER — Ambulatory Visit: Payer: Medicare Other | Admitting: Neurology

## 2014-02-20 NOTE — Telephone Encounter (Signed)
Spoke to pt spouse and reviewed the MOST and DNR forms in detail to help family and pt to decide the best course.   Spouse is going to talk over all parts of the forms with their children. Form will be dropped off sometime in the next few weeks.

## 2014-02-23 ENCOUNTER — Other Ambulatory Visit: Payer: Self-pay | Admitting: Internal Medicine

## 2014-02-24 ENCOUNTER — Other Ambulatory Visit: Payer: Self-pay | Admitting: *Deleted

## 2014-02-24 MED ORDER — MELOXICAM 15 MG PO TABS: 15.0000 mg | ORAL_TABLET | Freq: Every day | ORAL | Status: DC

## 2014-02-25 ENCOUNTER — Telehealth: Payer: Self-pay | Admitting: Internal Medicine

## 2014-02-25 MED ORDER — CEFUROXIME AXETIL 250 MG PO TABS: 250.0000 mg | ORAL_TABLET | Freq: Two times a day (BID) | ORAL | Status: DC

## 2014-02-25 NOTE — Telephone Encounter (Signed)
[name] with [facility]  Pt had uti and not sure what is going on with the meds?   Wife is upset  Can you call her at [phone]  Ask for [name]

## 2014-02-25 NOTE — Telephone Encounter (Signed)
Pt spouse contacted. Labs results reviewed by another provider.  Ceftin 250 bid x 10 days

## 2014-02-26 NOTE — Telephone Encounter (Signed)
[name] with [facility] lvm. She stated that she has not heard about UTI trx.  Spoke to pt spouse to make sure that abx was picked up and administered to pt. Tandy Gaw (spouse) stated that it is.   LVM [name] informing that pt spouse has abx.

## 2014-03-04 ENCOUNTER — Telehealth: Payer: Self-pay | Admitting: Internal Medicine

## 2014-03-09 ENCOUNTER — Encounter: Payer: Self-pay | Admitting: Internal Medicine

## 2014-03-09 NOTE — Telephone Encounter (Signed)
Spoke to pt spouse. Informed that the MOST FORM and DNR is signed and ready for pick up.

## 2014-03-10 ENCOUNTER — Telehealth: Payer: Self-pay | Admitting: Neurology

## 2014-03-10 ENCOUNTER — Encounter: Payer: Self-pay | Admitting: Neurology

## 2014-03-10 MED ORDER — AMITRIPTYLINE HCL 10 MG PO TABS: 10.0000 mg | ORAL_TABLET | Freq: Every day | ORAL | Status: DC

## 2014-03-10 NOTE — Telephone Encounter (Signed)
I called and discussed questions that patient's wife had.  We will try low dose amitriptyline 10mg  to see if that helps with behavior and sleep.  OK to cancel his follow-up appointment, since transferring him to clinic visits is difficult.  They will contact us with updates as needed.  Aashna Matson K. Posey Pronto, DO

## 2014-03-10 NOTE — Telephone Encounter (Signed)
I canceled patient follow up appt on 03-23-14  per Dr Posey Pronto

## 2014-03-13 DIAGNOSIS — N390 Urinary tract infection, site not specified: Secondary | ICD-10-CM

## 2014-03-13 DIAGNOSIS — R296 Repeated falls: Secondary | ICD-10-CM

## 2014-03-13 DIAGNOSIS — G3183 Dementia with Lewy bodies: Secondary | ICD-10-CM | POA: Diagnosis not present

## 2014-03-13 DIAGNOSIS — M1990 Unspecified osteoarthritis, unspecified site: Secondary | ICD-10-CM

## 2014-03-13 DIAGNOSIS — R32 Unspecified urinary incontinence: Secondary | ICD-10-CM | POA: Diagnosis not present

## 2014-03-17 ENCOUNTER — Encounter: Payer: Self-pay | Admitting: Internal Medicine

## 2014-03-18 ENCOUNTER — Other Ambulatory Visit: Payer: Self-pay

## 2014-03-18 ENCOUNTER — Encounter: Payer: Self-pay | Admitting: Internal Medicine

## 2014-03-18 MED ORDER — AMPICILLIN 500 MG PO CAPS: 500.0000 mg | ORAL_CAPSULE | Freq: Three times a day (TID) | ORAL | Status: DC

## 2014-03-23 ENCOUNTER — Ambulatory Visit: Payer: Medicare Other | Admitting: Neurology

## 2014-03-30 ENCOUNTER — Other Ambulatory Visit: Payer: Medicare Other

## 2014-03-30 ENCOUNTER — Other Ambulatory Visit: Payer: Self-pay | Admitting: Internal Medicine

## 2014-03-30 DIAGNOSIS — R300 Dysuria: Secondary | ICD-10-CM

## 2014-03-31 LAB — URINALYSIS W MICROSCOPIC + REFLEX CULTURE
BILIRUBIN URINE: NEGATIVE
CASTS: NONE SEEN
CRYSTALS: NONE SEEN
Glucose, UA: NEGATIVE mg/dL
Hgb urine dipstick: NEGATIVE
Ketones, ur: NEGATIVE mg/dL
LEUKOCYTES UA: NEGATIVE
Nitrite: NEGATIVE
PH: 6 (ref 5.0–8.0)
Protein, ur: NEGATIVE mg/dL
Specific Gravity, Urine: 1.013 (ref 1.005–1.030)
Squamous Epithelial / LPF: NONE SEEN
Urobilinogen, UA: 0.2 mg/dL (ref 0.0–1.0)

## 2014-04-01 ENCOUNTER — Encounter: Payer: Self-pay | Admitting: Internal Medicine

## 2014-04-03 MED ORDER — CIPROFLOXACIN HCL 250 MG PO TABS: 250.0000 mg | ORAL_TABLET | Freq: Two times a day (BID) | ORAL | Status: DC

## 2014-04-03 MED ORDER — FOSFOMYCIN TROMETHAMINE 3 G PO PACK: 3.0000 g | PACK | Freq: Once | ORAL | Status: DC

## 2014-04-03 NOTE — Telephone Encounter (Signed)
Have changed to ciprofloxacin 1 pill twice daily for 5 days.

## 2014-04-03 NOTE — Telephone Encounter (Signed)
There should be culture results by now. Can you call lab and make sure culture was sent (since he did not have typical signs of infection). If no culture we can empirically treat with fosfomycin which I have sent in. Mix up and drink once. The antibiotic lasts a long time in the urine and treats infection.

## 2014-04-03 NOTE — Telephone Encounter (Signed)
Pt wife called in said that pt has uti and has sent in a few email via mychart to ask about this.  She is requesting a call back from the nurse today  Best number  [phone]

## 2014-04-03 NOTE — Telephone Encounter (Signed)
Pt wife notified.

## 2014-04-03 NOTE — Telephone Encounter (Signed)
Pt wife indicates that pt urine is now very dark, smells bad and feels like he has to go but cant and then experiences pain when pt does go.

## 2014-04-03 NOTE — Addendum Note (Signed)
Addended by: Vertell Novak A on: 04/03/2014 04:33 PM   Modules accepted: Orders, Medications

## 2014-04-03 NOTE — Telephone Encounter (Signed)
Called pt wife. Stated that insurance doesn't cover and it is $100.00. The report from [facility] came in and there was not a culture done.

## 2014-04-03 NOTE — Telephone Encounter (Signed)
Please advise 

## 2014-04-09 ENCOUNTER — Encounter: Payer: Self-pay | Admitting: Internal Medicine

## 2014-04-13 ENCOUNTER — Telehealth: Payer: Self-pay | Admitting: Internal Medicine

## 2014-04-13 NOTE — Telephone Encounter (Signed)
Anda Kraft from [facility] care is returning your call. [phone]

## 2014-04-14 NOTE — Telephone Encounter (Signed)
I did not call Anda Kraft. However, I returned her call. She asked to speak to [place]. Forwarding message to her.

## 2014-04-15 ENCOUNTER — Encounter: Payer: Self-pay | Admitting: Internal Medicine

## 2014-04-19 NOTE — Telephone Encounter (Signed)
Please print rx for Ilario pended here to fax to [facility] -  Will need separatre for wife Tandy Gaw for clonaz refill to [facility] Please contact her to sched appt for him as noted above Thanks!

## 2014-04-20 MED ORDER — ALPRAZOLAM 0.5 MG PO TBDP: 0.5000 mg | ORAL_TABLET | Freq: Three times a day (TID) | ORAL | Status: DC | PRN

## 2014-04-22 ENCOUNTER — Telehealth: Payer: Self-pay

## 2014-04-22 NOTE — Telephone Encounter (Signed)
The wheelchair prescription was faxed to Anda Kraft at [facility] on March 22nd at 13:50pm by Natale Lay.

## 2014-04-23 ENCOUNTER — Telehealth: Payer: Self-pay | Admitting: Neurology

## 2014-04-23 ENCOUNTER — Encounter: Payer: Self-pay | Admitting: Internal Medicine

## 2014-04-23 NOTE — Telephone Encounter (Signed)
Called [place] [facility] who reports that PT does not have anything else to offer and recommended hospice.  We will send a referral to [facility] [place] on [facility] [address].  Willam Munford K. Posey Pronto, DO

## 2014-04-23 NOTE — Telephone Encounter (Signed)
Pt wife Tandy Gaw called and left message on the voice mail that they are calling in hospic and wanted Dr Posey Pronto to know please call [phone]

## 2014-04-23 NOTE — Telephone Encounter (Signed)
FYI

## 2014-04-24 ENCOUNTER — Telehealth: Payer: Self-pay | Admitting: Neurology

## 2014-04-24 NOTE — Telephone Encounter (Signed)
yevette from hospice called and left message on voice mail she needs information about patient please call [phone]

## 2014-04-24 NOTE — Telephone Encounter (Signed)
Called yvette back and she asked if we would like for hospice doctors to manage symptoms.  Answered yes.

## 2014-04-24 NOTE — Telephone Encounter (Signed)
Referral faxed

## 2014-04-25 ENCOUNTER — Encounter: Payer: Self-pay | Admitting: Internal Medicine

## 2014-04-27 ENCOUNTER — Encounter: Payer: Self-pay | Admitting: Internal Medicine

## 2014-04-27 ENCOUNTER — Telehealth: Payer: Self-pay | Admitting: Internal Medicine

## 2014-04-27 MED ORDER — ALPRAZOLAM 0.5 MG PO TBDP: 0.5000 mg | ORAL_TABLET | Freq: Three times a day (TID) | ORAL | Status: DC | PRN

## 2014-04-27 MED ORDER — OMEPRAZOLE 20 MG PO CPDR: 20.0000 mg | DELAYED_RELEASE_CAPSULE | Freq: Every day | ORAL | Status: DC

## 2014-04-27 MED ORDER — DULOXETINE HCL 30 MG PO CPEP: 30.0000 mg | ORAL_CAPSULE | Freq: Every day | ORAL | Status: DC

## 2014-04-27 MED ORDER — AMITRIPTYLINE HCL 10 MG PO TABS: 10.0000 mg | ORAL_TABLET | Freq: Every day | ORAL | Status: DC

## 2014-04-27 MED ORDER — CARBIDOPA-LEVODOPA 25-100 MG PO TABS: 2.0000 | ORAL_TABLET | Freq: Three times a day (TID) | ORAL | Status: DC

## 2014-04-27 MED ORDER — MELOXICAM 15 MG PO TABS: 15.0000 mg | ORAL_TABLET | Freq: Every day | ORAL | Status: DC

## 2014-04-27 MED ORDER — SORBITOL 70 % PO SOLN: 15.0000 mL | Freq: Every day | ORAL | Status: DC | PRN

## 2014-04-27 MED ORDER — ROPINIROLE HCL 0.25 MG PO TABS: ORAL_TABLET | ORAL | Status: AC

## 2014-04-27 MED ORDER — DILTIAZEM HCL ER COATED BEADS 180 MG PO CP24: ORAL_CAPSULE | ORAL | Status: DC

## 2014-04-27 MED ORDER — POLYETHYLENE GLYCOL 3350 17 G PO PACK: 17.0000 g | PACK | Freq: Every day | ORAL | Status: AC

## 2014-04-27 NOTE — Telephone Encounter (Deleted)
Nurse called and wanted to update with you on some med changes.

## 2014-04-27 NOTE — Telephone Encounter (Signed)
Spoke with pt spouse. Erx sent in per PCP. Mail order contacted and all rx for pt cancelled.

## 2014-04-27 NOTE — Telephone Encounter (Signed)
Sherry from Hospice wanted to talk with you about some changes she made to his medications.

## 2014-04-28 NOTE — Telephone Encounter (Signed)
Spoke to [place] with Hospice.  They are taking pt off of the following meds per wife [facility]) request: Allpurinol, metoprolol, zantac, crestor, vesicare and lasix.  These meds have been discontinued off pt med list.

## 2014-05-05 ENCOUNTER — Telehealth: Payer: Self-pay | Admitting: Internal Medicine

## 2014-05-05 NOTE — Telephone Encounter (Signed)
Wanted to go back on metoprolol tartrate (LOPRESSOR) 25 MG tablet [[id number]. Both bp and heart rate have elevated since he has been taken off of this. He also seems restless. Please call Helene Kelp from [facility]

## 2014-05-05 NOTE — Telephone Encounter (Signed)
Pt is currently under Hospice care. BP and pulse were both elevated and have been since the metoprolol was stopped. Pt was acting nervous and jittery. Pt was taken off due to wife wanting to decrease the amount of meds the pt was on. Rx was stopped last Monday 4//04/2014. Tandy Gaw gave him one this morning and pt is doing much better.

## 2014-05-06 NOTE — Telephone Encounter (Signed)
I recommended resuming metoprolol as previously prescribed because this rx qualifies as comfort med at this point in time due to reasons described. thanks

## 2014-05-07 NOTE — Telephone Encounter (Signed)
Notified [name] with md response...[name]

## 2014-05-15 ENCOUNTER — Other Ambulatory Visit: Payer: Self-pay | Admitting: *Deleted

## 2014-05-15 ENCOUNTER — Telehealth: Payer: Self-pay | Admitting: *Deleted

## 2014-05-15 MED ORDER — DICLOFENAC SODIUM 1 % TD GEL: 2.0000 g | Freq: Four times a day (QID) | TRANSDERMAL | Status: AC

## 2014-05-15 MED ORDER — ALPRAZOLAM 0.5 MG PO TBDP: 0.5000 mg | ORAL_TABLET | Freq: Four times a day (QID) | ORAL | Status: AC | PRN

## 2014-05-15 NOTE — Telephone Encounter (Signed)
Called [name] back and let her know it is ok to use Voltaren.  Rx called in.  Also corrected directions for alprazolam to 1 po q 6 hrs prn.

## 2014-05-15 NOTE — Telephone Encounter (Signed)
Patient wanting a refill on his Voltaren gel  Call back [place] with Hospice

## 2014-05-15 NOTE — Telephone Encounter (Signed)
OK to give him voltaren 1% gel. Caryl Pina, can you see if hospice need an order or do we send a prescription to his pharmacy?    Donika K. Posey Pronto, DO

## 2014-06-05 ENCOUNTER — Other Ambulatory Visit: Payer: Self-pay

## 2014-06-05 ENCOUNTER — Telehealth: Payer: Self-pay

## 2014-06-05 MED ORDER — AMPICILLIN 500 MG PO CAPS: 500.0000 mg | ORAL_CAPSULE | Freq: Three times a day (TID) | ORAL | Status: AC

## 2014-06-05 NOTE — Telephone Encounter (Signed)
Hospice RN called and requested abx due to dark colored urine, dysuria and odorous urine. RN stated that abx ampicillin worked well the last time UTI was indicated.  erx done. (per verbal okay from Alexander). RN requested for rx to be ran through Hospice. Called [facility] and informed of same.

## 2014-06-20 ENCOUNTER — Encounter (HOSPITAL_COMMUNITY): Payer: Self-pay

## 2014-06-20 ENCOUNTER — Emergency Department (HOSPITAL_COMMUNITY)

## 2014-06-20 ENCOUNTER — Emergency Department (HOSPITAL_COMMUNITY)
Admission: EM | Admit: 2014-06-20 | Discharge: 2014-06-20 | Disposition: A | Discharge status: Home / Self Care | Attending: Emergency Medicine | Admitting: Emergency Medicine

## 2014-06-20 DIAGNOSIS — Z9889 Other specified postprocedural states: Secondary | ICD-10-CM | POA: Insufficient documentation

## 2014-06-20 DIAGNOSIS — F329 Major depressive disorder, single episode, unspecified: Secondary | ICD-10-CM | POA: Diagnosis not present

## 2014-06-20 DIAGNOSIS — Z791 Long term (current) use of non-steroidal anti-inflammatories (NSAID): Secondary | ICD-10-CM | POA: Diagnosis not present

## 2014-06-20 DIAGNOSIS — I10 Essential (primary) hypertension: Secondary | ICD-10-CM | POA: Insufficient documentation

## 2014-06-20 DIAGNOSIS — Z951 Presence of aortocoronary bypass graft: Secondary | ICD-10-CM | POA: Diagnosis not present

## 2014-06-20 DIAGNOSIS — S79911A Unspecified injury of right hip, initial encounter: Secondary | ICD-10-CM | POA: Diagnosis not present

## 2014-06-20 DIAGNOSIS — H548 Legal blindness, as defined in USA: Secondary | ICD-10-CM | POA: Insufficient documentation

## 2014-06-20 DIAGNOSIS — Z7982 Long term (current) use of aspirin: Secondary | ICD-10-CM | POA: Insufficient documentation

## 2014-06-20 DIAGNOSIS — Y929 Unspecified place or not applicable: Secondary | ICD-10-CM | POA: Insufficient documentation

## 2014-06-20 DIAGNOSIS — Z86010 Personal history of colonic polyps: Secondary | ICD-10-CM | POA: Diagnosis not present

## 2014-06-20 DIAGNOSIS — F0280 Dementia in other diseases classified elsewhere without behavioral disturbance: Secondary | ICD-10-CM | POA: Insufficient documentation

## 2014-06-20 DIAGNOSIS — Z856 Personal history of leukemia: Secondary | ICD-10-CM | POA: Diagnosis not present

## 2014-06-20 DIAGNOSIS — Z8614 Personal history of Methicillin resistant Staphylococcus aureus infection: Secondary | ICD-10-CM | POA: Insufficient documentation

## 2014-06-20 DIAGNOSIS — Y999 Unspecified external cause status: Secondary | ICD-10-CM | POA: Diagnosis not present

## 2014-06-20 DIAGNOSIS — S79912A Unspecified injury of left hip, initial encounter: Secondary | ICD-10-CM | POA: Diagnosis not present

## 2014-06-20 DIAGNOSIS — Z8639 Personal history of other endocrine, nutritional and metabolic disease: Secondary | ICD-10-CM | POA: Diagnosis not present

## 2014-06-20 DIAGNOSIS — M1990 Unspecified osteoarthritis, unspecified site: Secondary | ICD-10-CM | POA: Insufficient documentation

## 2014-06-20 DIAGNOSIS — W19XXXA Unspecified fall, initial encounter: Secondary | ICD-10-CM

## 2014-06-20 DIAGNOSIS — Z79899 Other long term (current) drug therapy: Secondary | ICD-10-CM | POA: Insufficient documentation

## 2014-06-20 DIAGNOSIS — Y939 Activity, unspecified: Secondary | ICD-10-CM | POA: Diagnosis not present

## 2014-06-20 DIAGNOSIS — Z8546 Personal history of malignant neoplasm of prostate: Secondary | ICD-10-CM | POA: Insufficient documentation

## 2014-06-20 DIAGNOSIS — I4891 Unspecified atrial fibrillation: Secondary | ICD-10-CM | POA: Insufficient documentation

## 2014-06-20 DIAGNOSIS — K219 Gastro-esophageal reflux disease without esophagitis: Secondary | ICD-10-CM | POA: Diagnosis not present

## 2014-06-20 DIAGNOSIS — I2510 Atherosclerotic heart disease of native coronary artery without angina pectoris: Secondary | ICD-10-CM | POA: Diagnosis not present

## 2014-06-20 DIAGNOSIS — S0990XA Unspecified injury of head, initial encounter: Secondary | ICD-10-CM | POA: Diagnosis present

## 2014-06-20 DIAGNOSIS — W1789XA Other fall from one level to another, initial encounter: Secondary | ICD-10-CM | POA: Insufficient documentation

## 2014-06-20 DIAGNOSIS — Z87891 Personal history of nicotine dependence: Secondary | ICD-10-CM | POA: Diagnosis not present

## 2014-06-20 DIAGNOSIS — Z8619 Personal history of other infectious and parasitic diseases: Secondary | ICD-10-CM | POA: Diagnosis not present

## 2014-06-20 NOTE — ED Provider Notes (Signed)
CSN: [id number]     Arrival date & time 06/20/14  1648 History   First MD Initiated Contact with Patient 06/20/14 1650     Chief Complaint  Patient presents with  . Fall     (Consider location/radiation/quality/duration/timing/severity/associated sxs/prior Treatment) HPI.... Level V caveat for dementia. Patient was being lifted today by the hoyer lift when it malfunctioned and patient fell to the floor. He struck the back of his head and his buttocks. No other specific bony tenderness. Wife reports normal behavior. No prodromal illnesses.  Past Medical History  Diagnosis Date  . Adenomatous colon polyp 03/1993  . Prostate cancer   . Depression   . OSA (obstructive sleep apnea)   . Hearing loss     wears hearing aides bilaterally  . Atrial fibrillation     not anticoag candidate due to decond and falls  . Legally blind 1987  . AORTIC STENOSIS     s/p AVR 01/2010  . CLOSTRIDIUM DIFFICILE COLITIS 12/30/2009  . CORONARY ARTERY DISEASE     s/p CABG 01/28/10  . HIP REPLACEMENT, RIGHT, HX OF 2006  . HYPERTROPHY PROSTATE W/UR OBST & OTH LUTS   . MRSA infection 04/2010  . Gout   . THROMBOCYTOPENIA     likely ITP with CMML (bone marrow bx 2011)  . Arthritis   . DEGENERATIVE DISC DISEASE   . GERD   . Hyperlipidemia   . Hypertension   . MRSA infection greater than 3 months ago   . CMML (chronic myelomonocytic leukemia)   . Dyspnea   . Aortic valve prosthesis present   . Lewy body dementia without behavioral disturbance 12/27/2013   Past Surgical History  Procedure Laterality Date  . Coronary artery bypass graft  01/28/2010    x3  . Aortic valve replacement  01/28/2010  . Prostatectomy  1993  . Hernia repair  03/15/2009    left  . Anal sphincterotomy  1996  . Total hip arthroplasty  2006    left  . Cataract extraction, bilateral      4/11 & 5/11  . Cholecystectomy  2011  . Cardiac catheterization    . Ultrasound guidance for vascular access     Family History  Problem  Relation Age of Onset  . Heart disease Mother   . Lung cancer Mother   . Hypertension Brother   . Alcohol abuse Father   . Sudden death Father   . Aneurysm Father   . Heart attack Brother   . Gout Brother   . Arthritis Brother   . Arthritis Other   . Gout Other    History  Substance Use Topics  . Smoking status: Former Smoker -- 1.00 packs/day for 2 years    Types: Cigars    Quit date: 07/01/2009  . Smokeless tobacco: Never Used     Comment: Married, lives with spouse-2 kids. retired Geophysicist/field seismologist  . Alcohol Use: No    Review of Systems  Unable to perform ROS: Dementia      Allergies  Morphine; Septra; Doxycycline; Sulfamethoxazole; and Trimethoprim  Home Medications   Prior to Admission medications   Medication Sig Start Date End Date Taking? Authorizing Provider  acetaminophen (TYLENOL) 500 MG tablet Take 650 mg by mouth 2 (two) times daily as needed for pain.    Yes Historical Provider, MD  ALPRAZolam (NIRAVAM) 0.5 MG dissolvable tablet Take 1 tablet (0.5 mg total) by mouth every 6 (six) hours as needed for anxiety. Patient taking differently: Take 0.5 mg  by mouth every 6 (six) hours.  05/15/14  Yes Donika Keith Rake, DO  aspirin 325 MG tablet Take 325 mg by mouth daily.   Yes Historical Provider, MD  carbidopa-levodopa (SINEMET IR) 25-100 MG per tablet Take 2 tablets by mouth 3 (three) times daily. 04/27/14  Yes Rowe Clack, MD  diltiazem (CARDIZEM CD) 180 MG 24 hr capsule Take 1 capsule by mouth  every evening 04/27/14  Yes Rowe Clack, MD  DULoxetine (CYMBALTA) 30 MG capsule Take 1 capsule (30 mg total) by mouth daily. 04/27/14  Yes Rowe Clack, MD  metoprolol tartrate (LOPRESSOR) 25 MG tablet Take 25 mg by mouth 2 (two) times daily.   Yes Historical Provider, MD  omeprazole (PRILOSEC) 20 MG capsule Take 1 capsule (20 mg total) by mouth daily after breakfast. 04/27/14  Yes Rowe Clack, MD  QUEtiapine (SEROQUEL) 50 MG tablet Take 50 mg by mouth every 6  (six) hours.   Yes Historical Provider, MD  rOPINIRole (REQUIP) 0.25 MG tablet Take 3 tablets by mouth at  bedtime 04/27/14  Yes Rowe Clack, MD  senna (SENOKOT) 8.6 MG tablet Take 1 tablet by mouth daily.   Yes Historical Provider, MD  amitriptyline (ELAVIL) 10 MG tablet Take 1 tablet (10 mg total) by mouth at bedtime. 04/27/14   Rowe Clack, MD  ampicillin (PRINCIPEN) 500 MG capsule Take 1 capsule (500 mg total) by mouth 3 (three) times daily. Patient not taking: Reported on 06/20/2014 06/05/14   Rowe Clack, MD  diclofenac sodium (VOLTAREN) 1 % GEL Apply 2 g topically 4 (four) times daily. 05/15/14   Donika Keith Rake, DO  meloxicam (MOBIC) 15 MG tablet Take 1 tablet (15 mg total) by mouth daily. 04/27/14   Rowe Clack, MD  polyethylene glycol (MIRALAX / GLYCOLAX) packet Take 17 g by mouth daily. 04/27/14   Rowe Clack, MD  sorbitol 70 % solution Take 15 mLs by mouth daily as needed. 04/27/14   Rowe Clack, MD   BP 135/68 mmHg  Pulse 68  Temp(Src) 98 F (36.7 C) (Oral)  Resp 16  SpO2 98% Physical Exam  Constitutional:  Elderly, slightly confused  HENT:  Head: Normocephalic and atraumatic.  No obvious occipital hematoma  Eyes: Conjunctivae are normal.  Neck: Normal range of motion. Neck supple.  Cardiovascular: Normal rate and regular rhythm.   Pulmonary/Chest: Effort normal and breath sounds normal.  Abdominal: Soft. Bowel sounds are normal.  Musculoskeletal:  Minimal bilateral buttocks tenderness  Neurological: He is alert.  Skin: Skin is warm and dry.  Psychiatric:  Flat affect  Nursing note and vitals reviewed.   ED Course  Procedures (including critical care time) Labs Review Labs Reviewed - No data to display  Imaging Review Dg Pelvis 1-2 Views  06/20/2014   CLINICAL DATA:  Fall, tail bone pain  EXAM: PELVIS - 1-2 VIEW  COMPARISON:  None.  FINDINGS: No fracture or dislocation is seen.  Left hip arthroplasty. No evidence of hardware  fracture or loosening.  Moderate degenerative changes of the right hip.  Visualized bony pelvis appears intact.  Surgical clips overlying the pelvis.  Vascular calcifications.  IMPRESSION: Left hip arthroplasty, without evidence of complication.  Moderate degenerate changes of the right hip.  No fracture or dislocation is seen.   Electronically Signed   By: Julian Hy M.D.   On: 06/20/2014 18:21   Ct Head Wo Contrast  06/20/2014   CLINICAL DATA:  Paitent fell and hit  head on floor at home today, no loss of consciousness  EXAM: CT HEAD WITHOUT CONTRAST  TECHNIQUE: Contiguous axial images were obtained from the base of the skull through the vertex without intravenous contrast.  COMPARISON:  11/27/13  FINDINGS: Stable lacunar infarct, chronic, right cerebellum. Diffuse cerebral atrophy and white matter degneration, stable. No acute infarct, mass, hydrocephalus, hemorrhage, extraaxial fluid or skull fracture.  IMPRESSION: No acute traumatic injury.   Electronically Signed   By: Skipper Cliche M.D.   On: 06/20/2014 17:54     EKG Interpretation None      MDM   Final diagnoses:  Fall, initial encounter    CT of head and plain films of pelvis show no acute injury. Discussed findings with wife. She is comfortable taking him home.    Nat Christen, MD 06/20/14 Einar Crow

## 2014-06-20 NOTE — ED Notes (Addendum)
PER EMS: pt from home; is paralyzed from the waist down due to parkinson's disease and dementia. He uses a lift to get around but it malfunctioned today and he fell about 2 feet onto hardwood floor. He hit the back of his head and reports tailbone pain. No LOC, no memory loss. Pt A&O at baseline per wife who is at bedside. BP-151/68, HR-72, O2-96% RA. Denies neck or back pain.

## 2014-06-20 NOTE — Discharge Instructions (Signed)
X-ray showed no fracture.  Follow-up your primary care doctor. °

## 2014-06-20 NOTE — ED Notes (Signed)
Dr Cook at bedside

## 2014-06-23 ENCOUNTER — Other Ambulatory Visit: Payer: Self-pay | Admitting: *Deleted

## 2014-06-23 MED ORDER — AMITRIPTYLINE HCL 10 MG PO TABS: 10.0000 mg | ORAL_TABLET | Freq: Every day | ORAL | Status: AC

## 2014-06-23 MED ORDER — ALPRAZOLAM 0.5 MG PO TABS: 0.5000 mg | ORAL_TABLET | Freq: Four times a day (QID) | ORAL | Status: AC

## 2014-06-23 NOTE — Telephone Encounter (Signed)
Rx sent 

## 2014-07-20 ENCOUNTER — Other Ambulatory Visit: Payer: Self-pay

## 2014-07-24 ENCOUNTER — Telehealth: Payer: Self-pay | Admitting: Internal Medicine

## 2014-07-24 MED ORDER — SORBITOL 70 % PO SOLN: 15.0000 mL | Freq: Every day | ORAL | Status: AC | PRN

## 2014-07-24 NOTE — Telephone Encounter (Signed)
erx done

## 2014-07-24 NOTE — Telephone Encounter (Signed)
Have ordered sorbitol b/c there was no refills on med.

## 2014-08-07 ENCOUNTER — Telehealth: Payer: Self-pay | Admitting: Neurology

## 2014-08-07 ENCOUNTER — Other Ambulatory Visit: Payer: Medicare Other

## 2014-08-07 ENCOUNTER — Other Ambulatory Visit: Payer: Self-pay | Admitting: *Deleted

## 2014-08-07 ENCOUNTER — Ambulatory Visit: Payer: Medicare Other | Admitting: Hematology and Oncology

## 2014-08-07 MED ORDER — CARBIDOPA-LEVODOPA 25-100 MG PO TABS: 2.0000 | ORAL_TABLET | Freq: Two times a day (BID) | ORAL | Status: AC

## 2014-08-07 NOTE — Telephone Encounter (Signed)
Called and left message for [name] to call me back.

## 2014-08-07 NOTE — Telephone Encounter (Signed)
[name] called from Hospice in regards to pt's medication carbidopa-levodopa/Dawn CB# [phone]

## 2014-08-07 NOTE — Telephone Encounter (Signed)
Patient needs refill on carb-levo, he takes it bid.  Rx sent.

## 2014-08-28 ENCOUNTER — Telehealth: Payer: Self-pay | Admitting: *Deleted

## 2014-08-28 NOTE — Telephone Encounter (Signed)
Clarene Critchley with hospice called in referance to a refill on his medication (Voltaren) please give her a call so she can explain Call back number [phone]

## 2014-08-28 NOTE — Telephone Encounter (Signed)
[name] requested prn refills for Voltaren.  VO given.

## 2014-09-08 ENCOUNTER — Other Ambulatory Visit: Payer: Self-pay | Admitting: *Deleted

## 2014-09-08 MED ORDER — ACETAMINOPHEN 325 MG PO TABS: 650.0000 mg | ORAL_TABLET | Freq: Two times a day (BID) | ORAL | Status: AC

## 2014-09-08 MED ORDER — GUAIFENESIN 100 MG/5ML PO LIQD: 200.0000 mg | Freq: Four times a day (QID) | ORAL | Status: AC | PRN

## 2014-11-24 ENCOUNTER — Other Ambulatory Visit: Payer: Self-pay | Admitting: Internal Medicine

## 2014-11-27 ENCOUNTER — Encounter: Payer: Self-pay | Admitting: Internal Medicine

## 2014-12-01 MED ORDER — DULOXETINE HCL 30 MG PO CPEP: 30.0000 mg | ORAL_CAPSULE | Freq: Every day | ORAL | Status: AC

## 2014-12-01 MED ORDER — MELOXICAM 15 MG PO TABS: 15.0000 mg | ORAL_TABLET | Freq: Every day | ORAL | Status: AC

## 2014-12-01 MED ORDER — OMEPRAZOLE 20 MG PO CPDR: 20.0000 mg | DELAYED_RELEASE_CAPSULE | Freq: Every day | ORAL | Status: AC

## 2014-12-01 MED ORDER — DILTIAZEM HCL ER COATED BEADS 180 MG PO CP24: ORAL_CAPSULE | ORAL | Status: AC

## 2015-05-28 ENCOUNTER — Telehealth: Payer: Self-pay | Admitting: *Deleted

## 2015-05-28 NOTE — Telephone Encounter (Signed)
Please have them fax an updated and current medication list.   Donika K. Posey Pronto, DO

## 2015-05-28 NOTE — Telephone Encounter (Signed)
[name] with hospice called stating that patient's wife wants to decrease meds.  I told her that we can look over his neuro meds and let her know if any of them can be decreased.  # [phone]

## 2015-05-28 NOTE — Telephone Encounter (Signed)
Called and requested updated list.

## 2015-07-24 DEATH — deceased

## 2015-11-05 IMAGING — CR DG CHEST 2V
2 series · 2 of 2 positions shown · non-contrast
Comparison: 05/25/2012

CLINICAL DATA: Fall. Atrial fibrillation. History of hypertension,
CAD.

EXAM:
CHEST  2 VIEW

[w chest pa]
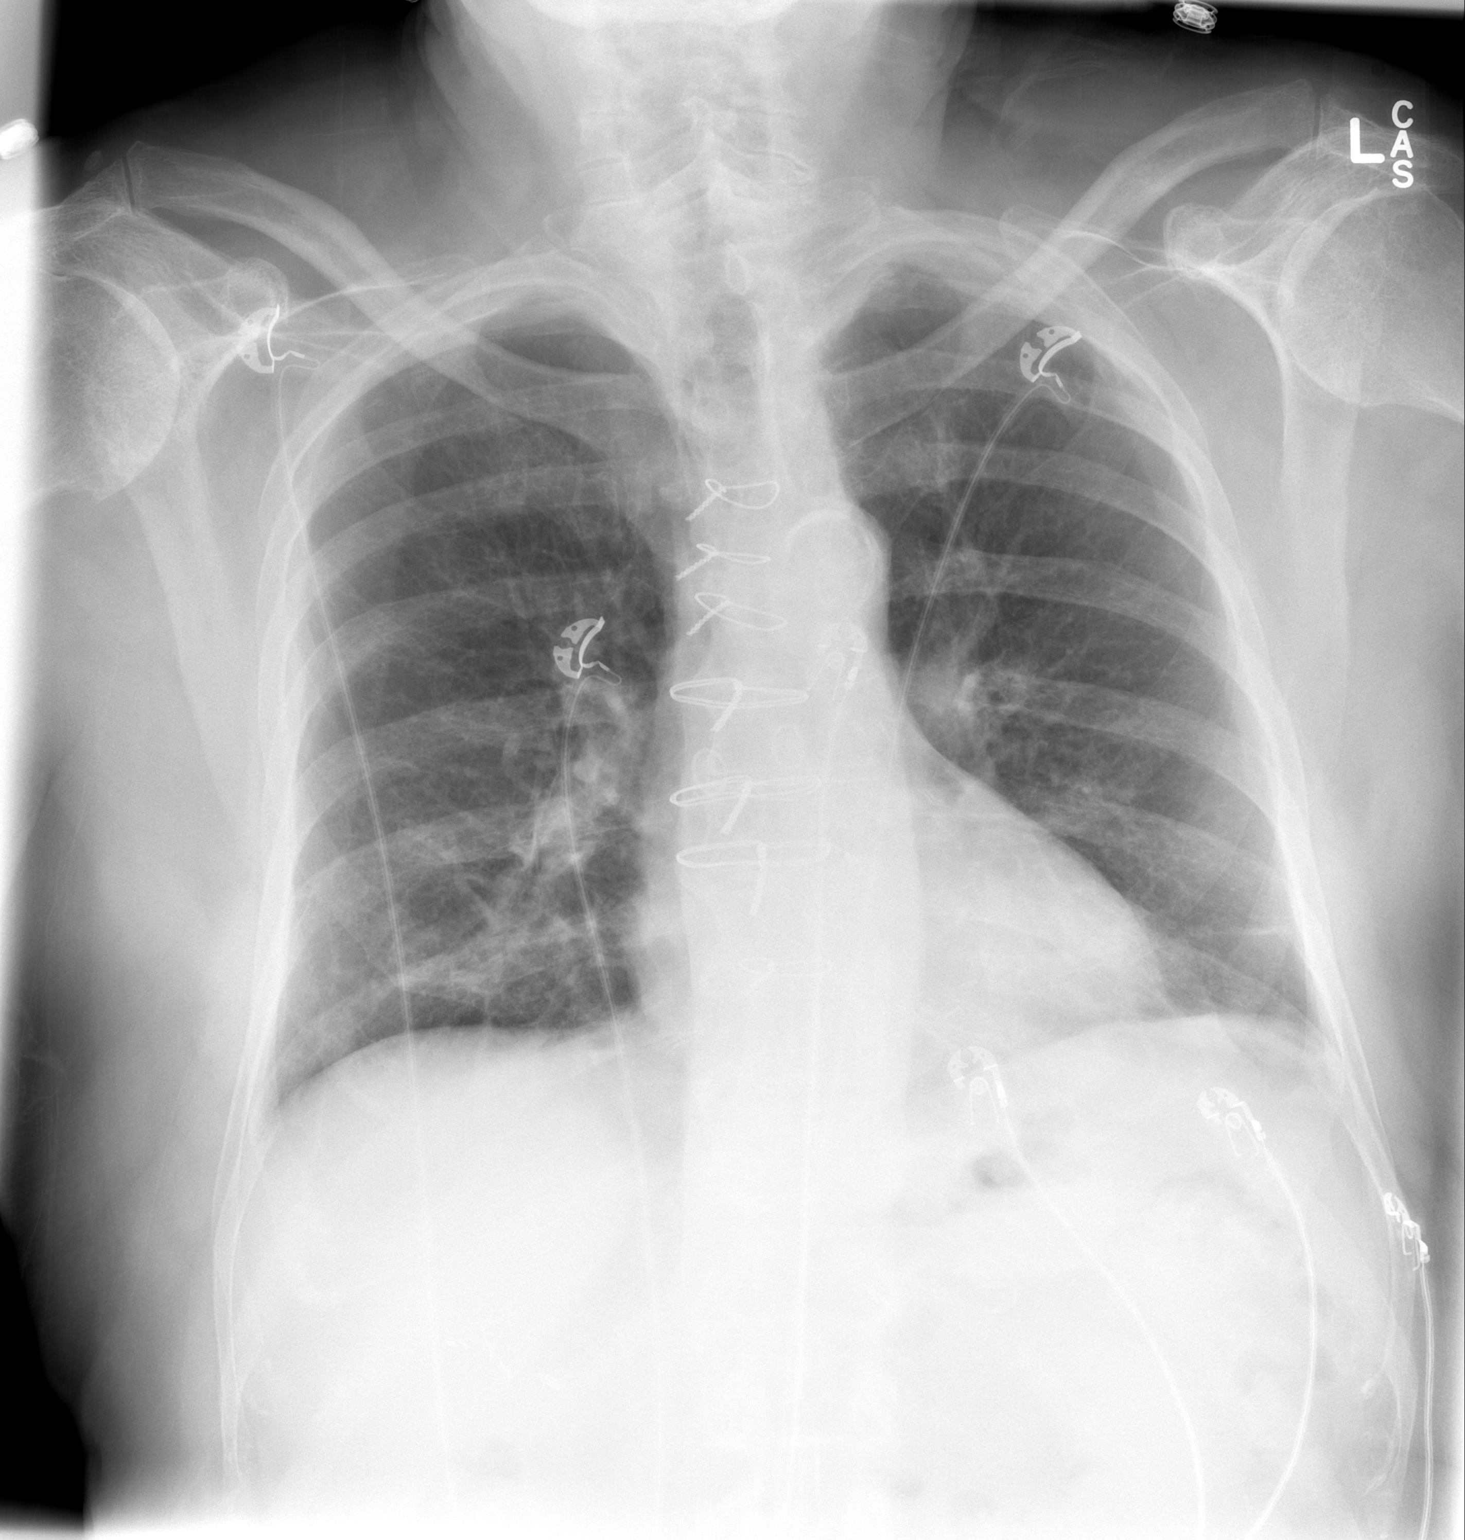

[w chest lat]
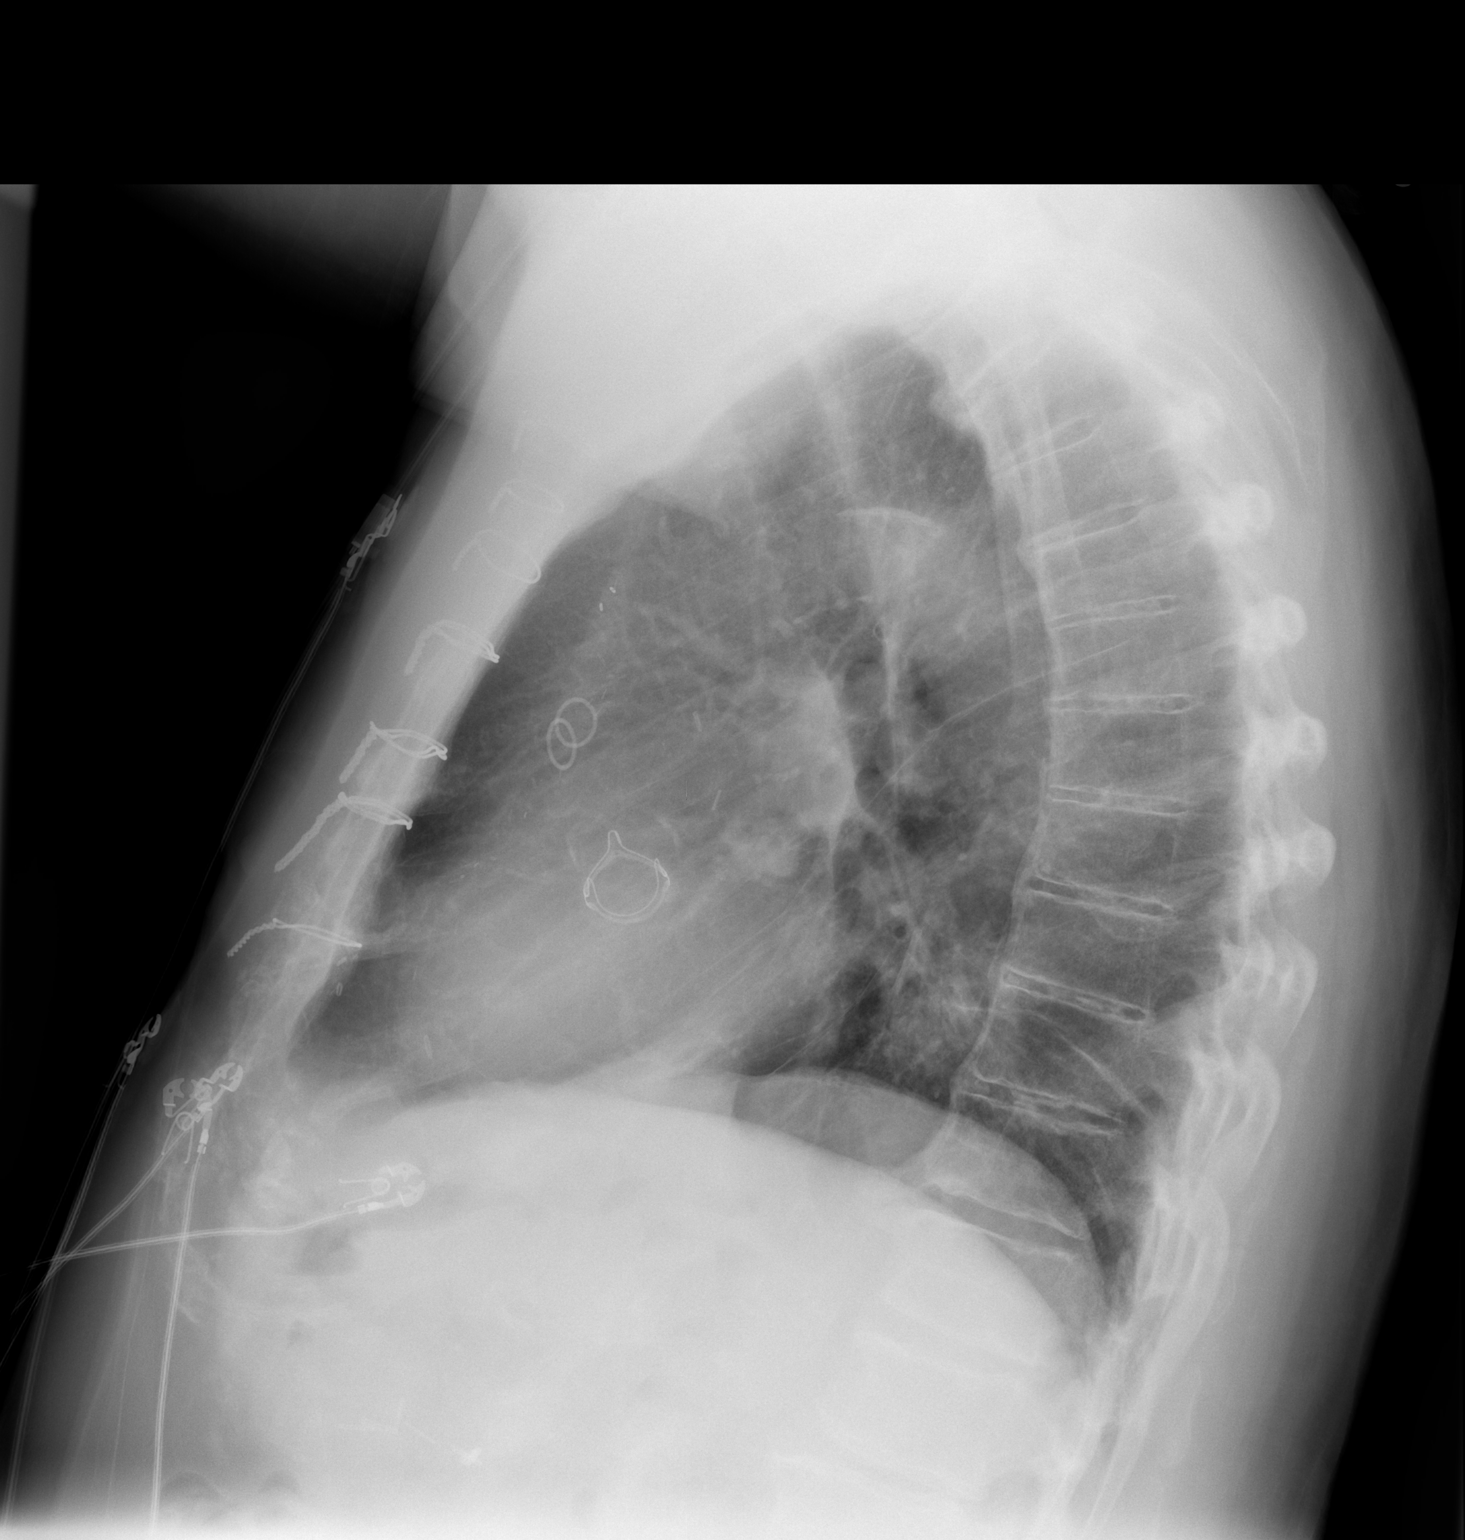

[2 of 2 positions shown; findings below may reference images not displayed]

FINDINGS: Patient has had median sternotomy and CABG. Heart is normal in size.
Perihilar peribronchial thickening is noted. There are no focal
consolidations or pleural effusions. No pneumothorax or acute
displaced fractures.
IMPRESSION: No active cardiopulmonary disease.

## 2015-11-05 IMAGING — CR DG SHOULDER 2+V*L*
2 series · 2 of 2 positions shown · non-contrast
Comparison: None.

CLINICAL DATA: Fall, left shoulder pain

EXAM:
LEFT SHOULDER - 2+ VIEW

[view not recorded (1 of 2)]
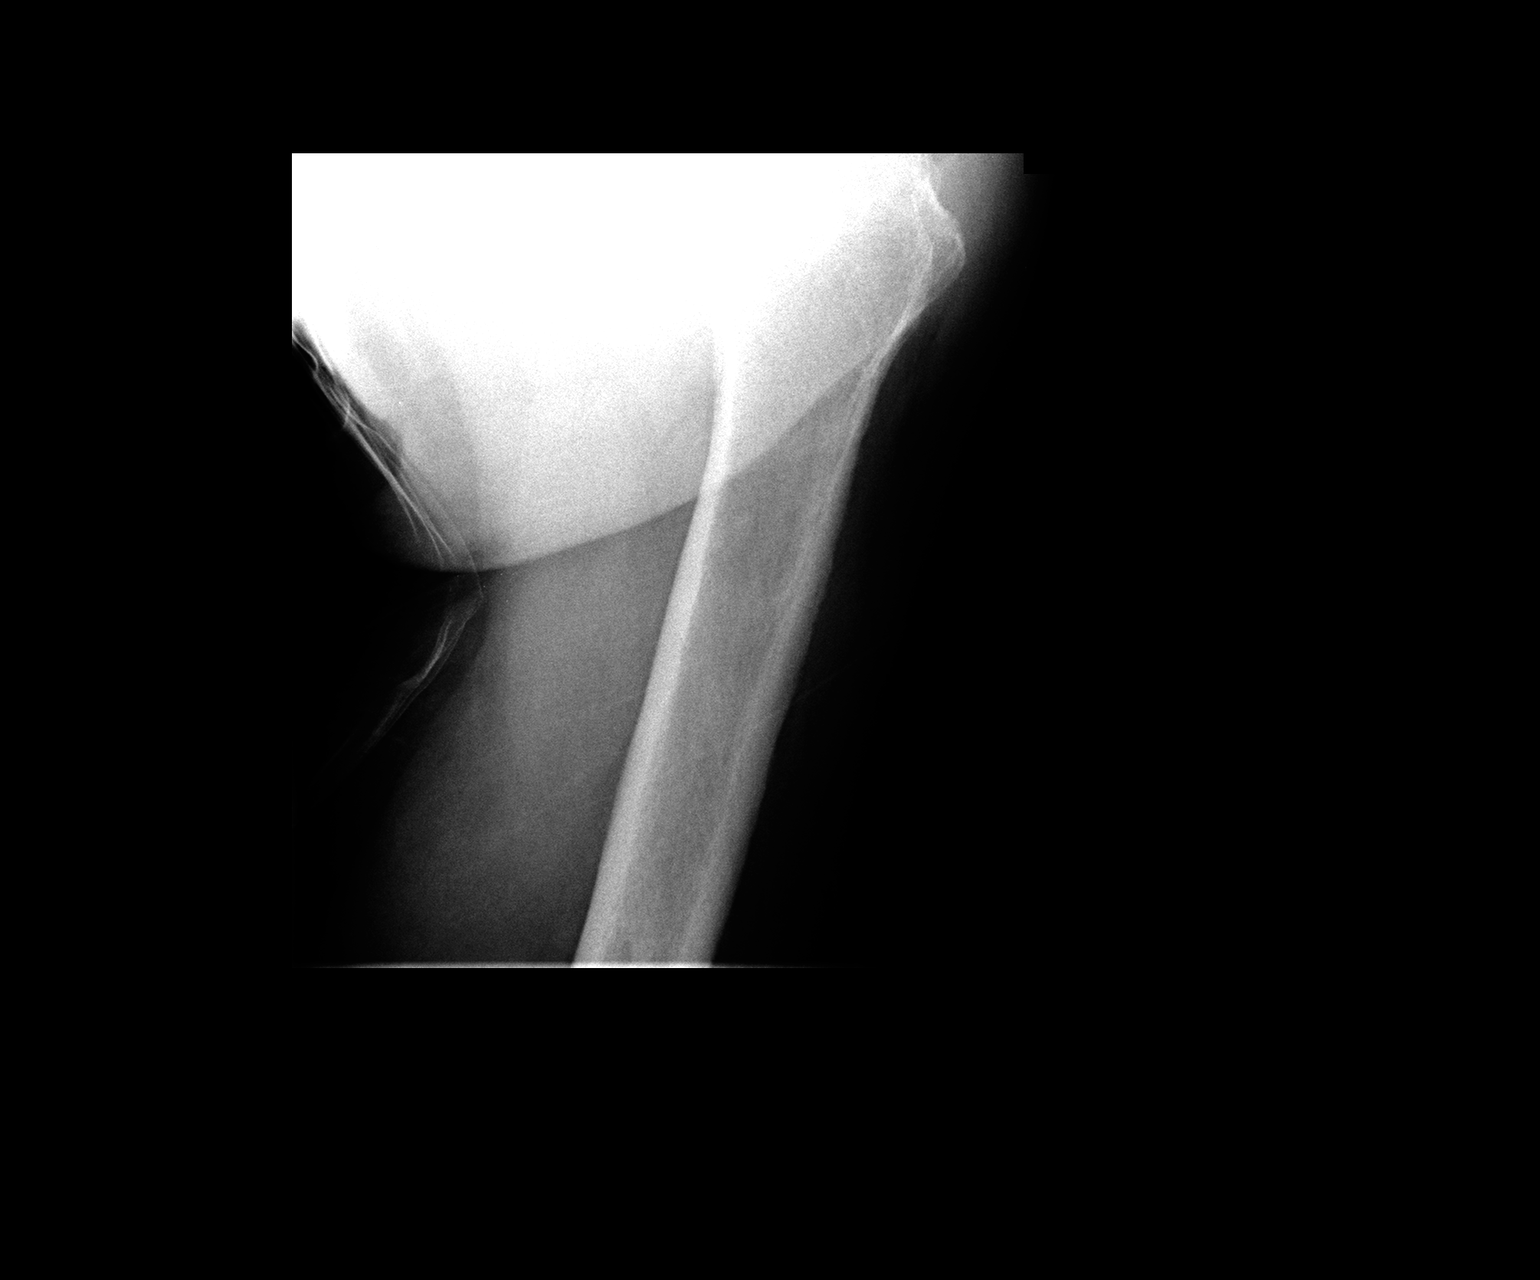

[view not recorded (2 of 2)]
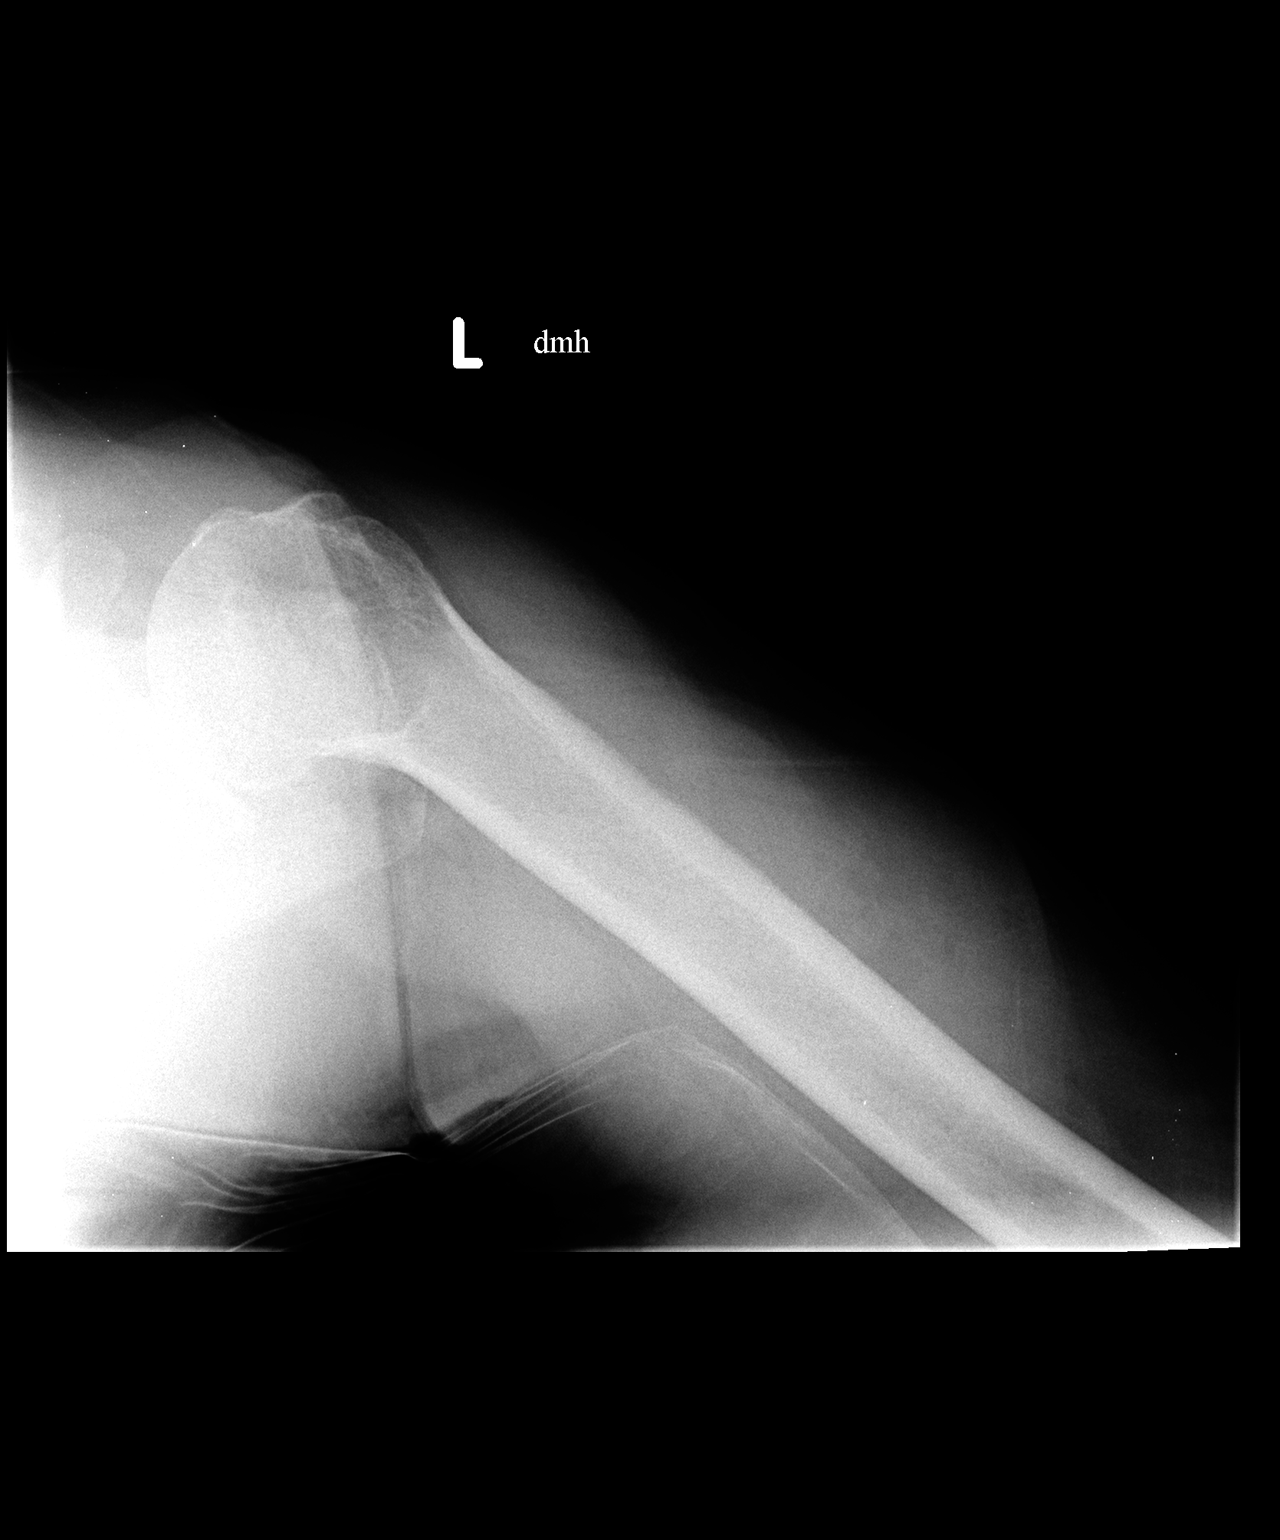

[2 of 2 positions shown; findings below may reference images not displayed]

FINDINGS: Limited exam with cross-table views. Glenohumeral joint is intact.
No evidence of scapular fracture or humeral fracture.
IMPRESSION: No acute osseous abnormality.

## 2018-01-11 NOTE — Telephone Encounter (Signed)
error
